# Patient Record
Sex: Male | Born: 1935 | Race: White | Hispanic: No | Marital: Married | Smoking: Former smoker
Health system: Southern US, Community
[De-identification: ages and names within clinical notes are randomized; demographics above are authoritative.]

## PROBLEM LIST (undated history)

## (undated) DIAGNOSIS — I2510 Atherosclerotic heart disease of native coronary artery without angina pectoris: Secondary | ICD-10-CM

## (undated) DIAGNOSIS — I480 Paroxysmal atrial fibrillation: Secondary | ICD-10-CM

## (undated) DIAGNOSIS — Z8669 Personal history of other diseases of the nervous system and sense organs: Secondary | ICD-10-CM

## (undated) DIAGNOSIS — Z955 Presence of coronary angioplasty implant and graft: Secondary | ICD-10-CM

## (undated) DIAGNOSIS — Z9889 Other specified postprocedural states: Secondary | ICD-10-CM

## (undated) DIAGNOSIS — N138 Other obstructive and reflux uropathy: Secondary | ICD-10-CM

## (undated) DIAGNOSIS — I10 Essential (primary) hypertension: Secondary | ICD-10-CM

## (undated) DIAGNOSIS — Z859 Personal history of malignant neoplasm, unspecified: Secondary | ICD-10-CM

## (undated) DIAGNOSIS — R001 Bradycardia, unspecified: Secondary | ICD-10-CM

## (undated) DIAGNOSIS — Z973 Presence of spectacles and contact lenses: Secondary | ICD-10-CM

## (undated) DIAGNOSIS — Z974 Presence of external hearing-aid: Secondary | ICD-10-CM

## (undated) DIAGNOSIS — M1990 Unspecified osteoarthritis, unspecified site: Secondary | ICD-10-CM

## (undated) DIAGNOSIS — N401 Enlarged prostate with lower urinary tract symptoms: Secondary | ICD-10-CM

## (undated) HISTORY — PX: CARDIOVASCULAR STRESS TEST: SHX262

## (undated) HISTORY — PX: KNEE ARTHROSCOPY: SHX127

## (undated) HISTORY — DX: Essential (primary) hypertension: I10

## (undated) HISTORY — PX: EYE SURGERY: SHX253

## (undated) HISTORY — PX: TRANSTHORACIC ECHOCARDIOGRAM: SHX275

## (undated) HISTORY — PX: HAND SURGERY: SHX662

## (undated) HISTORY — PX: TONSILLECTOMY: SUR1361

---

## 2000-06-29 ENCOUNTER — Ambulatory Visit (HOSPITAL_COMMUNITY): Admission: RE | Admit: 2000-06-29 | Discharge: 2000-06-29 | Payer: Self-pay | Admitting: Orthopedic Surgery

## 2003-04-09 ENCOUNTER — Ambulatory Visit (HOSPITAL_COMMUNITY): Admission: RE | Admit: 2003-04-09 | Discharge: 2003-04-09 | Payer: Self-pay | Admitting: Gastroenterology

## 2003-05-16 ENCOUNTER — Ambulatory Visit (HOSPITAL_COMMUNITY): Admission: RE | Admit: 2003-05-16 | Discharge: 2003-05-16 | Payer: Self-pay | Admitting: Orthopedic Surgery

## 2005-01-12 ENCOUNTER — Ambulatory Visit (HOSPITAL_BASED_OUTPATIENT_CLINIC_OR_DEPARTMENT_OTHER): Admission: RE | Admit: 2005-01-12 | Discharge: 2005-01-12 | Payer: Self-pay | Admitting: Orthopedic Surgery

## 2005-01-12 ENCOUNTER — Ambulatory Visit (HOSPITAL_COMMUNITY): Admission: RE | Admit: 2005-01-12 | Discharge: 2005-01-12 | Payer: Self-pay | Admitting: Orthopedic Surgery

## 2013-04-05 HISTORY — PX: CATARACT EXTRACTION W/ INTRAOCULAR LENS  IMPLANT, BILATERAL: SHX1307

## 2013-09-27 ENCOUNTER — Ambulatory Visit (INDEPENDENT_AMBULATORY_CARE_PROVIDER_SITE_OTHER): Payer: Medicare Other | Admitting: Podiatry

## 2013-09-27 ENCOUNTER — Encounter: Payer: Self-pay | Admitting: Podiatry

## 2013-09-27 ENCOUNTER — Ambulatory Visit (INDEPENDENT_AMBULATORY_CARE_PROVIDER_SITE_OTHER): Payer: Medicare Other

## 2013-09-27 VITALS — BP 127/78 | HR 60 | Resp 16 | Ht 73.0 in | Wt 175.0 lb

## 2013-09-27 DIAGNOSIS — M779 Enthesopathy, unspecified: Secondary | ICD-10-CM

## 2013-09-27 DIAGNOSIS — M216X9 Other acquired deformities of unspecified foot: Secondary | ICD-10-CM

## 2013-09-27 DIAGNOSIS — M2040 Other hammer toe(s) (acquired), unspecified foot: Secondary | ICD-10-CM

## 2013-09-27 NOTE — Progress Notes (Signed)
Subjective:     Patient ID: Stuart Flores, male   DOB: [birth year], 80 y.o.   MRN: [id number]  Foot Pain   patient presents stating that when he is hiking and going down heel is getting a lot of pain in the forefeet of both feet stating he try to change boot and it did not seem to solve his problem   Review of Systems  All other systems reviewed and are negative.      Objective:   Physical Exam  Nursing note and vitals reviewed. Cardiovascular: Intact distal pulses.   Musculoskeletal: Normal range of motion.  Neurological: He is alert.  Skin: Skin is dry.   neurovascular status intact with mild equinus condition noted and mild diminishment of muscle strength. Patient has high arch foot type with periodic lesions which occur underneath the lesser metatarsal and diminished fat pad with digital deformities noted of the lesser digits. 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 Reappoint when orthotics are ready

## 2013-09-27 NOTE — Progress Notes (Signed)
   Subjective:    Patient ID: Stuart Flores, male    DOB: 23-May-1933, 80 y.o.   MRN: [id number]  HPI Comments: "I don't typically have pain with my feet, but I have lately"  Patient c/o plantar forefoot and toes bilateral, left over right for several months. He went to [place] and doing hiking and noticed pain with the decline of hills. There are some callused areas. He tried thicker socks and that helped some.  Foot Pain      Review of Systems  All other systems reviewed and are negative.      Objective:   Physical Exam        Assessment & Plan:

## 2013-12-28 ENCOUNTER — Ambulatory Visit (INDEPENDENT_AMBULATORY_CARE_PROVIDER_SITE_OTHER): Payer: Medicare Other | Admitting: Podiatry

## 2013-12-28 DIAGNOSIS — M779 Enthesopathy, unspecified: Secondary | ICD-10-CM

## 2013-12-28 NOTE — Progress Notes (Signed)
Pt is here to [facility] 

## 2013-12-28 NOTE — Patient Instructions (Signed)

## 2015-03-03 DIAGNOSIS — I2089 Other forms of angina pectoris: Secondary | ICD-10-CM

## 2015-03-03 DIAGNOSIS — I208 Other forms of angina pectoris: Secondary | ICD-10-CM

## 2015-03-03 DIAGNOSIS — E785 Hyperlipidemia, unspecified: Secondary | ICD-10-CM | POA: Insufficient documentation

## 2015-03-03 NOTE — H&P (Signed)
OFFICE VISIT NOTES COPIED TO EPIC FOR DOCUMENTATION  Stuart Flores March 21, 2015 10:49 AM Location: [facility] Patient #: [id number] DOB: September 25, 1933 Married / Language: [name] / Race: White Male   History of Present Illness Stuart Pert MD; 03-21-2015 11:22 AM) Patient words: NP EVAL for CP; EKG was done at PCP this morning.  The patient is a 81 year old male who presents with chest pain. Chest pain started 2 weeks ago. Described as pressure in the middle of the chest without radiation. Present with exertion and is easily relieved with rest. No rest pain. He uses a stationary bicycle at least 3-4 days a week exercises for about 35 minutes during that episode, and has felt well. The symptoms are new, clearly exertional and relieved with rest.  He had called from [place] while visiting and his PCP, due to his symptoms of chest pain, he was prescribed metoprolol and also sublingual nitroglycerin. He is tolerating the medications well, has not used any sublingual nitroglycerin. He has stopped exercises for the past 2 weeks due to exertional chest discomfort. He was also found to have new EKG abnormality this morning with T wave inversion in the inferior and lateral leads that was not noted in this study. Due to his classic presentation he was referred to me an urgent basis for evaluation. No rest pain, no dyspnea, no PND or orthopnea. Denies any dizziness or syncope. No palpitations. He has h/o hyperlipidemia. No history of TIA or claudication. There is no history of prior hypertension or diabetes mellitus. He does not smoke cigarettes or use tobacco products.   Problem List/Past Medical (Stuart Flores; 21-Mar-2015 11:03 AM) HLD (hyperlipidemia) (E78.5) Benign prostatic hypertrophy (N40.0)  Allergies (Stuart Flores; 21-Mar-2015 10:55 AM) No Known Drug AllergiesDec 16, 2016  Family History (Stuart Flores; 03/21/15 10:56 AM) Mother Deceased. at age 79  from an MI; (was her 1st one) Father Deceased. at age 49 from Pancreatic Cancer; No known Heart conditions Sister 1 Younger  Social History (Stuart Flores; March 21, 2015 10:57 AM) Current tobacco use Former smoker. Quit at age 72 Alcohol Use Occasional alcohol use. Marital status Married. Number of Children 4. Living Situation Lives with spouse.  Past Surgical History (Stuart Flores; 21-Mar-2015 10:57 AM) Arthroscopic Knee Surgery - [id number]  Medication History (Stuart Flores; 03/21/2015 11:01 AM) Myrbetriq (  Tablet ER 24HR, 1 Oral daily) Active. Metoprolol Succinate ER (  Tablet ER 24HR, 1 Oral daily) Active. Nitroglycerin (0.4MG  Tab Sublingual, 1 Sublingual every 5 minutes as needed for chest pain.) Active. Fish Oil (  Capsule, 1 Oral daily) Active. Multiple Vitamin (1 (one) Oral daily) Active. Glucosamine 1500 Complex (1 Oral daily) Active. Probiotic Daily (1 Oral daily) Active. Aspirin EC (  Tablet DR, 1 Oral daily) Active. Medications Reconciled  Diagnostic Studies History Stuart Flores, AGNP-C; 2015/03/21 11:10 AM) Colonoscopy09/2016 Normal. Treadmill stress [phone] Normal. Labwork 08/28/2014: Creatinine 0.9, potassium 5.2, CMP normal, CBC normal, total cholesterol 206, triglycerides 88, HDL 39, LDL 149, TSH 1.71, PSA negative    Review of Systems Stuart Pert MD; Mar 21, 2015 11:25 AM) General Present- Feeling well. Not Present- Fatigue, Fever and Night Sweats. Skin Not Present- Itching and Rash. HEENT Not Present- Headache. Respiratory Not Present- Difficulty Breathing. Cardiovascular Present- Leg Cramps. Not Present- Claudications, Fainting, Orthopnea and Swelling of Extremities. Gastrointestinal Not Present- Abdominal Pain, Constipation, Diarrhea, Nausea and Vomiting. Musculoskeletal Not Present- Joint Swelling. Neurological Not Present- Headaches. Hematology Not Present- Blood Clots, Easy Bruising and Nose  Bleed.  Vitals (Stuart Flores; 03/21/2015 11:06 AM) 21-Mar-2015 10:50 AM  Weight: 175.25 lb Height: 73in Body Surface Area: 2.03 m Body Mass Index: 23.12 kg/m  Pulse: 53 (Regular)  P.OX: 96% (Room air) BP: 90/62 (Sitting, Left Arm, Standard)       Physical Exam Stuart Flores(Jagadeesh R. Carden Teel MD; 03/03/2015 11:25 AM) General Mental Status-Alert. General Appearance-Cooperative, Appears younger than stated age, Not in acute distress. Orientation-Oriented X3. Build & Nutrition-Well built.  Head and Neck Thyroid Gland Characteristics - no palpable nodules, no palpable enlargement.  Chest and Lung Exam Chest and lung exam reveals -on auscultation, normal breath sounds, no adventitious sounds and normal vocal resonance. Palpation Tender - No chest wall tenderness.  Cardiovascular Cardiovascular examination reveals -normal heart sounds, regular rate and rhythm with no murmurs, carotid auscultation reveals no bruits and abdominal aorta auscultation reveals no bruits. Inspection Jugular vein - Right - No Distention.  Abdomen Palpation/Percussion Palpation and Percussion of the abdomen reveal - Non Tender and No hepatosplenomegaly. Auscultation Auscultation of the abdomen reveals - Bowel sounds normal.  Peripheral Vascular Lower Extremity Inspection - Left - No Pigmentation, No Varicose veins. Right - No Pigmentation, No Varicose veins. Bilateral - Loss of hair. Palpation - Edema - Bilateral - No edema. Femoral pulse - Bilateral - Normal. Popliteal pulse - Bilateral - Normal. Dorsalis pedis pulse - Bilateral - Absent. Posterior tibial pulse - Bilateral - Absent. Carotid arteries - Left-No Carotid bruit. Carotid arteries - Right-No Carotid bruit. Abdomen-No prominent abdominal aortic pulsation, No epigastric bruit.  Neurologic Motor-Grossly intact without any focal deficits.  Musculoskeletal Global Assessment Left Lower Extremity - normal range of motion  without pain. Right Lower Extremity - normal range of motion without pain.    Assessment & Plan Stuart Flores(Jagadeesh R. Fox Salminen MD; 03/03/2015 9:12 PM) Angina pectoris, crescendo (I20.0) Impression: EKG 03/03/2015: Marked sinus bradycardia at rate of 43 bpm with first-degree AV block, left atrial abnormality, nonspecific ST depression in the inferior leads and lateral leads with T-wave inversion, cannot exclude ischemia. Normal QT interval. Compared to the EKG done on 03/12/2011, ST-T wave changes new. No change in heart rate. Dyslipidemia (high LDL; low HDL) (E78.4) Current Plans Started Atorvastatin Calcium 40MG , 1 (one) Tablet daily, #30, 03/03/2015, Ref. x2. Abnormal EKG (R94.31) Current Plans Mechanism of underlying disease process and action of medications discussed with the patient. I discussed primary/secondary prevention and also dietary counceling was done. Patient symptoms are very concerning for progressive crescendo angina pectoris, hence would recommend proceeding with coronary angiography. Schedule for cardiac catheterization, and possible angioplasty. We discussed regarding risks, benefits, alternatives to this including stress testing, CTA and continued medical therapy. Patient wants to proceed. Understands <1-2% risk of death, stroke, MI, urgent CABG, bleeding, infection, renal failure but not limited to these. Video recording of the procedure shown to the patient. Office visit after the tests. Unable to titrate medications due to low BP and bradycardia. This was a greater than 50 minute office visit with greater than 50% of the time spent with face-to-face encounter with patient and evaluation of complex medical issues and review of medical records.   Signed by Stuart PertJagadeesh R Matasha Smigelski, MD (03/03/2015 9:12 PM)

## 2015-03-04 ENCOUNTER — Ambulatory Visit (HOSPITAL_COMMUNITY)
Admission: RE | Admit: 2015-03-04 | Discharge: 2015-03-05 | Disposition: A | Discharge status: Home / Self Care | Payer: Medicare Other | Source: Ambulatory Visit | Attending: Cardiology | Admitting: Cardiology

## 2015-03-04 ENCOUNTER — Encounter (HOSPITAL_COMMUNITY): Payer: Self-pay | Admitting: General Practice

## 2015-03-04 ENCOUNTER — Encounter (HOSPITAL_COMMUNITY)
Admission: RE | Disposition: A | Discharge status: Home / Self Care | Payer: Medicare Other | Source: Ambulatory Visit | Attending: Cardiology

## 2015-03-04 DIAGNOSIS — Z7982 Long term (current) use of aspirin: Secondary | ICD-10-CM | POA: Diagnosis not present

## 2015-03-04 DIAGNOSIS — N400 Enlarged prostate without lower urinary tract symptoms: Secondary | ICD-10-CM | POA: Insufficient documentation

## 2015-03-04 DIAGNOSIS — E785 Hyperlipidemia, unspecified: Secondary | ICD-10-CM | POA: Diagnosis not present

## 2015-03-04 DIAGNOSIS — I25110 Atherosclerotic heart disease of native coronary artery with unstable angina pectoris: Secondary | ICD-10-CM | POA: Insufficient documentation

## 2015-03-04 DIAGNOSIS — Z9861 Coronary angioplasty status: Secondary | ICD-10-CM

## 2015-03-04 DIAGNOSIS — Z8249 Family history of ischemic heart disease and other diseases of the circulatory system: Secondary | ICD-10-CM | POA: Insufficient documentation

## 2015-03-04 DIAGNOSIS — I208 Other forms of angina pectoris: Secondary | ICD-10-CM

## 2015-03-04 DIAGNOSIS — I2089 Other forms of angina pectoris: Secondary | ICD-10-CM

## 2015-03-04 DIAGNOSIS — I25119 Atherosclerotic heart disease of native coronary artery with unspecified angina pectoris: Secondary | ICD-10-CM | POA: Diagnosis present

## 2015-03-04 DIAGNOSIS — Z87891 Personal history of nicotine dependence: Secondary | ICD-10-CM | POA: Diagnosis not present

## 2015-03-04 DIAGNOSIS — Z955 Presence of coronary angioplasty implant and graft: Secondary | ICD-10-CM

## 2015-03-04 HISTORY — DX: Presence of coronary angioplasty implant and graft: Z95.5

## 2015-03-04 HISTORY — DX: Atherosclerotic heart disease of native coronary artery without angina pectoris: I25.10

## 2015-03-04 HISTORY — PX: CARDIAC CATHETERIZATION: SHX172

## 2015-03-04 LAB — BASIC METABOLIC PANEL
Anion gap: 5 (ref 5–15)
BUN: 23 mg/dL — AB (ref 6–20)
CHLORIDE: 107 mmol/L (ref 101–111)
CO2: 29 mmol/L (ref 22–32)
CREATININE: 1.19 mg/dL (ref 0.61–1.24)
Calcium: 9 mg/dL (ref 8.9–10.3)
GFR calc Af Amer: >60 mL/min (ref >60)
GFR calc non Af Amer: 56 mL/min — ABNORMAL LOW (ref >60)
GLUCOSE: 90 mg/dL (ref 65–99)
POTASSIUM: 4.5 mmol/L (ref 3.5–5.1)
SODIUM: 141 mmol/L (ref 135–145)

## 2015-03-04 LAB — CBC
HEMATOCRIT: 38.2 % — AB (ref 39.0–52.0)
HEMOGLOBIN: 12.5 g/dL — AB (ref 13.0–17.0)
MCH: 30.1 pg (ref 26.0–34.0)
MCHC: 32.7 g/dL (ref 30.0–36.0)
MCV: 92 fL (ref 78.0–100.0)
Platelets: 227 10*3/uL (ref 150–400)
RBC: 4.15 MIL/uL — AB (ref 4.22–5.81)
RDW: 14.7 % (ref 11.5–15.5)
WBC: 6.7 10*3/uL (ref 4.0–10.5)

## 2015-03-04 LAB — POCT ACTIVATED CLOTTING TIME
Activated Clotting Time: 214 seconds
Activated Clotting Time: 552 seconds

## 2015-03-04 LAB — PROTIME-INR
INR: 1.03 (ref 0.00–1.49)
PROTHROMBIN TIME: 13.7 s (ref 11.6–15.2)

## 2015-03-04 SURGERY — LEFT HEART CATH AND CORONARY ANGIOGRAPHY

## 2015-03-04 MED ORDER — SODIUM CHLORIDE 0.9 % IJ SOLN: 3.0000 mL | INTRAMUSCULAR | Status: DC | PRN

## 2015-03-04 MED ORDER — SODIUM CHLORIDE 0.9 % IV SOLN: 250.0000 mL | INTRAVENOUS | Status: DC | PRN

## 2015-03-04 MED ORDER — HYDROMORPHONE HCL 1 MG/ML IJ SOLN
INTRAMUSCULAR | Status: AC
  Filled 2015-03-04: qty 1

## 2015-03-04 MED ORDER — SODIUM CHLORIDE 0.9 % IJ SOLN
3.0000 mL | Freq: Two times a day (BID) | INTRAMUSCULAR | Status: DC
  Administered 2015-03-04: 17:00:00 3 mL via INTRAVENOUS

## 2015-03-04 MED ORDER — HEPARIN SODIUM (PORCINE) 1000 UNIT/ML IJ SOLN
INTRAMUSCULAR | Status: AC
  Filled 2015-03-04: qty 1

## 2015-03-04 MED ORDER — ACETAMINOPHEN 325 MG PO TABS: 650.0000 mg | ORAL_TABLET | ORAL | Status: DC | PRN

## 2015-03-04 MED ORDER — LIDOCAINE HCL (PF) 1 % IJ SOLN
INTRAMUSCULAR | Status: AC
Start: 2015-03-04 — End: 2015-03-04
  Filled 2015-03-04: qty 30

## 2015-03-04 MED ORDER — HEPARIN (PORCINE) IN NACL 2-0.9 UNIT/ML-% IJ SOLN
INTRAMUSCULAR | Status: AC
  Filled 2015-03-04: qty 1000

## 2015-03-04 MED ORDER — SODIUM CHLORIDE 0.9 % WEIGHT BASED INFUSION: 1.0000 mL/kg/h | INTRAVENOUS | Status: DC

## 2015-03-04 MED ORDER — OMEGA-3-ACID ETHYL ESTERS 1 G PO CAPS
1.0000 g | ORAL_CAPSULE | Freq: Two times a day (BID) | ORAL | Status: DC
  Administered 2015-03-04 – 2015-03-05 (×2): 1 g via ORAL
  Filled 2015-03-04 (×2): qty 1

## 2015-03-04 MED ORDER — NITROGLYCERIN 1 MG/10 ML FOR IR/CATH LAB
INTRA_ARTERIAL | Status: DC | PRN
  Administered 2015-03-04: 150 ug via INTRACORONARY
  Administered 2015-03-04: 200 ug via INTRACORONARY

## 2015-03-04 MED ORDER — VERAPAMIL HCL 2.5 MG/ML IV SOLN
INTRA_ARTERIAL | Status: DC | PRN
  Administered 2015-03-04: 5 mL via INTRA_ARTERIAL

## 2015-03-04 MED ORDER — MIDAZOLAM HCL 2 MG/2ML IJ SOLN
INTRAMUSCULAR | Status: AC
  Filled 2015-03-04: qty 2

## 2015-03-04 MED ORDER — MIDAZOLAM HCL 2 MG/2ML IJ SOLN
INTRAMUSCULAR | Status: DC | PRN
  Administered 2015-03-04: 2 mg via INTRAVENOUS

## 2015-03-04 MED ORDER — SODIUM CHLORIDE 0.9 % WEIGHT BASED INFUSION
3.0000 mL/kg/h | INTRAVENOUS | Status: DC
  Administered 2015-03-04: 3 mL/kg/h via INTRAVENOUS

## 2015-03-04 MED ORDER — ASPIRIN EC 81 MG PO TBEC
81.0000 mg | DELAYED_RELEASE_TABLET | Freq: Every day | ORAL | Status: DC
  Administered 2015-03-05: 10:00:00 81 mg via ORAL
  Filled 2015-03-04: qty 1

## 2015-03-04 MED ORDER — MIRABEGRON ER 50 MG PO TB24
50.0000 mg | ORAL_TABLET | Freq: Every day | ORAL | Status: DC
  Administered 2015-03-04: 17:00:00 50 mg via ORAL
  Filled 2015-03-04 (×2): qty 1

## 2015-03-04 MED ORDER — SODIUM CHLORIDE 0.9 % WEIGHT BASED INFUSION: 3.0000 mL/kg/h | INTRAVENOUS | Status: AC

## 2015-03-04 MED ORDER — BIVALIRUDIN BOLUS VIA INFUSION - CUPID
INTRAVENOUS | Status: DC | PRN
  Administered 2015-03-04: 58.875 mg via INTRAVENOUS

## 2015-03-04 MED ORDER — TICAGRELOR 90 MG PO TABS
ORAL_TABLET | ORAL | Status: AC
Start: 2015-03-04 — End: 2015-03-04
  Filled 2015-03-04: qty 1

## 2015-03-04 MED ORDER — TICAGRELOR 90 MG PO TABS
ORAL_TABLET | ORAL | Status: DC | PRN
  Administered 2015-03-04: 180 mg via ORAL

## 2015-03-04 MED ORDER — BIVALIRUDIN 250 MG IV SOLR
INTRAVENOUS | Status: AC
  Filled 2015-03-04: qty 250

## 2015-03-04 MED ORDER — ATORVASTATIN CALCIUM 40 MG PO TABS: 40.0000 mg | ORAL_TABLET | Freq: Every day | ORAL | Status: DC

## 2015-03-04 MED ORDER — TICAGRELOR 90 MG PO TABS
ORAL_TABLET | ORAL | Status: AC
  Filled 2015-03-04: qty 1

## 2015-03-04 MED ORDER — SODIUM CHLORIDE 0.9 % IV SOLN
250.0000 mg | INTRAVENOUS | Status: DC | PRN
  Administered 2015-03-04: 1.75 mg/kg/h via INTRAVENOUS

## 2015-03-04 MED ORDER — IOHEXOL 350 MG/ML SOLN
INTRAVENOUS | Status: DC | PRN
  Administered 2015-03-04: 190 mL via INTRAVENOUS

## 2015-03-04 MED ORDER — ONDANSETRON HCL 4 MG/2ML IJ SOLN: 4.0000 mg | Freq: Four times a day (QID) | INTRAMUSCULAR | Status: DC | PRN

## 2015-03-04 MED ORDER — NITROGLYCERIN 1 MG/10 ML FOR IR/CATH LAB
INTRA_ARTERIAL | Status: AC
  Filled 2015-03-04: qty 10

## 2015-03-04 MED ORDER — ADULT MULTIVITAMIN W/MINERALS CH
1.0000 | ORAL_TABLET | Freq: Every day | ORAL | Status: DC
Start: 2015-03-05 — End: 2015-03-05
  Administered 2015-03-05: 1 via ORAL
  Filled 2015-03-04: qty 1

## 2015-03-04 MED ORDER — TICAGRELOR 90 MG PO TABS
90.0000 mg | ORAL_TABLET | Freq: Two times a day (BID) | ORAL | Status: DC
  Administered 2015-03-04 – 2015-03-05 (×2): 90 mg via ORAL
  Filled 2015-03-04: qty 1

## 2015-03-04 MED ORDER — VERAPAMIL HCL 2.5 MG/ML IV SOLN
INTRAVENOUS | Status: AC
  Filled 2015-03-04: qty 2

## 2015-03-04 MED ORDER — ANGIOPLASTY BOOK
Freq: Once | Status: AC
  Administered 2015-03-04
  Filled 2015-03-04: qty 1

## 2015-03-04 MED ORDER — SODIUM CHLORIDE 0.9 % IJ SOLN: 3.0000 mL | Freq: Two times a day (BID) | INTRAMUSCULAR | Status: DC

## 2015-03-04 MED ORDER — ASPIRIN 81 MG PO CHEW: 81.0000 mg | CHEWABLE_TABLET | ORAL | Status: DC

## 2015-03-04 MED ORDER — HYDROMORPHONE HCL 1 MG/ML IJ SOLN
INTRAMUSCULAR | Status: DC | PRN
  Administered 2015-03-04: 0.5 mg via INTRAVENOUS

## 2015-03-04 SURGICAL SUPPLY — 17 items
BALLN ANGIOSCULPT RX 2.5X10 (BALLOONS) ×4
BALLN ~~LOC~~ TREK RX 3.75X20 (BALLOONS) ×2 IMPLANT
BALLOON ANGIOSCULPT RX 2.5X10 (BALLOONS) IMPLANT
CATH INFINITI JR4 5F (CATHETERS) ×2 IMPLANT
CATH OPTITORQUE TIG 4.0 5F (CATHETERS) ×4 IMPLANT
CATH VISTA GUIDE 6FR AL1 (CATHETERS) ×2 IMPLANT
DEVICE RAD COMP TR BAND LRG (VASCULAR PRODUCTS) ×4 IMPLANT
GLIDESHEATH SLEND A-KIT 6F 20G (SHEATH) ×4 IMPLANT
KIT ENCORE 26 ADVANTAGE (KITS) ×2 IMPLANT
KIT HEART LEFT (KITS) ×4 IMPLANT
PACK CARDIAC CATHETERIZATION (CUSTOM PROCEDURE TRAY) ×4 IMPLANT
STENT RESOLUTE INTEG 2.75X22 (Permanent Stent) ×2 IMPLANT
STENT RESOLUTE INTEG 4.0X30 (Permanent Stent) ×2 IMPLANT
TRANSDUCER W/STOPCOCK (MISCELLANEOUS) ×4 IMPLANT
TUBING CIL FLEX 10 FLL-RA (TUBING) ×4 IMPLANT
WIRE COUGAR XT STRL 190CM (WIRE) ×2 IMPLANT
WIRE SAFE-T 1.5MM-J .035X260CM (WIRE) ×4 IMPLANT

## 2015-03-04 NOTE — Progress Notes (Signed)
TR BAND REMOVAL  LOCATION:    right radial  DEFLATED PER PROTOCOL:    Yes.    TIME BAND OFF / DRESSING APPLIED:    1515   SITE UPON ARRIVAL:    Level 0  SITE AFTER BAND REMOVAL:    Level 0  CIRCULATION SENSATION AND MOVEMENT:    Within Normal Limits   Yes.    COMMENTS:   Tolerated procedure well 

## 2015-03-04 NOTE — Interval H&P Note (Signed)
History and Physical Interval Note:  03/04/2015 10:36 AM  Stuart Flores  has presented today for surgery, with the diagnosis of cp  The various methods of treatment have been discussed with the patient and family. After consideration of risks, benefits and other options for treatment, the patient has consented to  Procedure(s): Left Heart Cath and Coronary Angiography (N/A) and possible PCI  as a surgical intervention .  The patient's history has been reviewed, patient examined, no change in status, stable for surgery.  I have reviewed the patient's chart and labs.  Questions were answered to the patient's satisfaction.    Ischemic Symptoms? CCS III (Marked limitation of ordinary activity) Anti-ischemic Medical Therapy? Minimal Therapy (1 class of medications) Non-invasive Test Results? No non-invasive testing performed Prior CABG? No Previous CABG   Patient Information:   1-2V CAD, no prox LAD  A (7)  Indication: 20; Score: 7   Patient Information:   1-2V-CAD with DS 50-60% With No FFR, No IVUS  I (3)  Indication: 21; Score: 3   Patient Information:   1-2V-CAD with DS 50-60% With FFR  A (7)  Indication: 22; Score: 7   Patient Information:   1-2V-CAD with DS 50-60% With FFR>0.8, IVUS not significant  I (2)  Indication: 23; Score: 2   Patient Information:   3V-CAD without LMCA With Abnormal LV systolic function  A (9)  Indication: 48; Score: 9   Patient Information:   LMCA-CAD  A (9)  Indication: 49; Score: 9   Patient Information:   2V-CAD with prox LAD PCI  A (7)  Indication: 62; Score: 7   Patient Information:   2V-CAD with prox LAD CABG  A (8)  Indication: 62; Score: 8   Patient Information:   3V-CAD without LMCA With Low CAD burden(i.e., 3 focal stenoses, low SYNTAX score) PCI  A (7)  Indication: 63; Score: 7   Patient Information:   3V-CAD without LMCA With Low CAD burden(i.e., 3 focal stenoses, low SYNTAX  score) CABG  A (9)  Indication: 63; Score: 9   Patient Information:   3V-CAD without LMCA E06c - Intermediate-high CAD burden (i.e., multiple diffuse lesions, presence of CTO, or high SYNTAX score) PCI  U (4)  Indication: 64; Score: 4   Patient Information:   3V-CAD without LMCA E06c - Intermediate-high CAD burden (i.e., multiple diffuse lesions, presence of CTO, or high SYNTAX score) CABG  A (9)  Indication: 64; Score: 9   Patient Information:   LMCA-CAD With Isolated LMCA stenosis  PCI  U (6)  Indication: 65; Score: 6   Patient Information:   LMCA-CAD With Isolated LMCA stenosis  CABG  A (9)  Indication: 65; Score: 9   Patient Information:   LMCA-CAD Additional CAD, low CAD burden (i.e., 1- to 2-vessel additional involvement, low SYNTAX score) PCI  U (5)  Indication: 66; Score: 5   Patient Information:   LMCA-CAD Additional CAD, low CAD burden (i.e., 1- to 2-vessel additional involvement, low SYNTAX score) CABG  A (9)  Indication: 66; Score: 9   Patient Information:   LMCA-CAD Additional CAD, intermediate-high CAD burden (i.e., 3-vessel involvement, presence of CTO, or high SYNTAX score) PCI  I (3)  Indication: 67; Score: 3   Patient Information:   LMCA-CAD Additional CAD, intermediate-high CAD burden (i.e., 3-vessel involvement, presence of CTO, or high SYNTAX score) CABG  A (9)  Indication: 67; Score: 9  Stuart Flores

## 2015-03-05 ENCOUNTER — Encounter (HOSPITAL_COMMUNITY): Payer: Self-pay | Admitting: Cardiology

## 2015-03-05 DIAGNOSIS — N400 Enlarged prostate without lower urinary tract symptoms: Secondary | ICD-10-CM | POA: Diagnosis not present

## 2015-03-05 DIAGNOSIS — Z7982 Long term (current) use of aspirin: Secondary | ICD-10-CM | POA: Diagnosis not present

## 2015-03-05 DIAGNOSIS — E785 Hyperlipidemia, unspecified: Secondary | ICD-10-CM | POA: Diagnosis not present

## 2015-03-05 DIAGNOSIS — I25110 Atherosclerotic heart disease of native coronary artery with unstable angina pectoris: Secondary | ICD-10-CM | POA: Diagnosis not present

## 2015-03-05 LAB — BASIC METABOLIC PANEL
Anion gap: 5 (ref 5–15)
BUN: 17 mg/dL (ref 6–20)
CALCIUM: 8.8 mg/dL — AB (ref 8.9–10.3)
CHLORIDE: 108 mmol/L (ref 101–111)
CO2: 28 mmol/L (ref 22–32)
CREATININE: 0.98 mg/dL (ref 0.61–1.24)
GFR calc non Af Amer: >60 mL/min (ref >60)
Glucose, Bld: 93 mg/dL (ref 65–99)
Potassium: 4.2 mmol/L (ref 3.5–5.1)
SODIUM: 141 mmol/L (ref 135–145)

## 2015-03-05 LAB — CBC
HCT: 35.1 % — ABNORMAL LOW (ref 39.0–52.0)
Hemoglobin: 11.7 g/dL — ABNORMAL LOW (ref 13.0–17.0)
MCH: 30.5 pg (ref 26.0–34.0)
MCHC: 33.3 g/dL (ref 30.0–36.0)
MCV: 91.4 fL (ref 78.0–100.0)
PLATELETS: 206 10*3/uL (ref 150–400)
RBC: 3.84 MIL/uL — AB (ref 4.22–5.81)
RDW: 14.9 % (ref 11.5–15.5)
WBC: 8.8 10*3/uL (ref 4.0–10.5)

## 2015-03-05 MED ORDER — TICAGRELOR 90 MG PO TABS: 90.0000 mg | ORAL_TABLET | Freq: Two times a day (BID) | ORAL | Status: DC

## 2015-03-05 MED FILL — Heparin Sodium (Porcine) Inj 1000 Unit/ML: INTRAMUSCULAR | Qty: 10 | Status: AC

## 2015-03-05 NOTE — Progress Notes (Signed)
CARDIAC REHAB PHASE I   PRE:  Rate/Rhythm: 57 SB  BP:  Sitting: 116/70        SaO2: 97 RA  MODE:  Ambulation: 1000 ft   POST:  Rate/Rhythm: 84 SR  BP:  Sitting: 132/77         SaO2: 99 RA  Pt ambulated 1000 ft on RA, independent, steady gait, tolerated well.  Pt denies cp, dizziness, DOE, declined rest stop. Completed PCI/stent education.  Reviewed risk factors, anti-platelet therapy, stent card, activity restrictions, ntg, exercise, heart healthy diet, and phase 2 cardiac rehab. Pt verbalized understanding, receptive to education. Pt agrees to phase 2 cardiac rehab referral[facility] Pt to recliner after walk, call bell within reach. Pt awaiting discharge.  [phone]  Joylene GrapesMonge, Dauna Ziska C, RN, BSN 03/05/2015 9:25 AM

## 2015-03-05 NOTE — Discharge Summary (Signed)
Physician Discharge Summary  Patient ID: Stuart HazyKenneth W Flores MRN: [id number] DOB/AGE: [birth year] 79 y.o.  Admit date: 03/04/2015 Discharge date: 03/05/2015  Primary Discharge Diagnosis 1.  Coronary artery disease involving native vessel 2.  Status post PCI 2 dominant midcircumflex and mid LAD with implantation of the stents 3.  Hyperlipidemia. 08/28/2014: Creatinine 0.9, potassium 5.2, CMP normal, CBC normal, total cholesterol 206, triglycerides 88, HDL 39, LDL 149, TSH 1.71, PSA negative  Significant Diagnostic Studies: 03/04/2015: 1. Normal LV systolic function, 55-60%. 2. Small nondominant RCA. Large dominant circumflex coronary artery. Circumflex 99%/subtotally occluded. 3. Diffusely diseased LAD, proximal 40-50%, mid diffuse moderate disease, mid LAD with a 80% stenosis. 4. Diagonal 1 with a proximal 70% stenosis, small to moderate-sized vessel. 5. Successful PTCA and stenting of the mid circumflex coronary artery with implantation of a 4.0 x 30 mm resolute integrity DES, stenosis reduced from 99% to 0%, TIMI flow improved from 2-3 6. Successful PTCA and stenting of the mid LAD with implantation of a 2.75 x 22 mm resolute DES, 80% reduced to 0%.  Hospital Course: patient evaluated in the outpatient setting with symptoms suggestive of intermediate coronary syndrome, hence urgent ordering angiography was recommended and scheduled for angiography the following morning.  Underwent successful two-vessel PCI, following morning asymptomatic with walking with cardiac rehabilitation, felt stable for discharge.  Recommendations on discharge: patient will be continued on aspirin 81 mg daily indefinitely, has been started on Brilinta 90 mg by mouth twice a day, he is now willing to stay on statin, atorvastatin 40 mg daily.  He is on low dose of metoprolol due to low blood pressure and bradycardia.  No ACE inhibitor started due to low blood pressure.  Office visit as previously scheduled for  follow-up.  Discharge Exam: Blood pressure 116/70, pulse 61, temperature 97.8 F (36.6 C), temperature source Oral, resp. rate 20, height 6\' 1"  (1.854 m), weight 79.3 kg (174 lb 13.2 oz), SpO2 98 %.  General appearance: alert, cooperative, appears stated age and no distress Resp: clear to auscultation bilaterally Cardio: regular rate and rhythm, S1, S2 normal, no murmur, click, rub or gallop GI: soft, non-tender; bowel sounds normal; no masses,  no organomegaly Extremities: extremities normal, atraumatic, no cyanosis or edema Pulses: 2+ and symmetric Neurologic: Grossly normal Labs:   Lab Results  Component Value Date   WBC 8.8 03/05/2015   HGB 11.7* 03/05/2015   HCT 35.1* 03/05/2015   MCV 91.4 03/05/2015   PLT 206 03/05/2015    Recent Labs Lab 03/05/15 0450  NA 141  K 4.2  CL 108  CO2 28  BUN 17  CREATININE 0.98  CALCIUM 8.8*  GLUCOSE 93    Lipid Panel  08/28/2014: Creatinine 0.9, potassium 5.2, CMP normal, CBC normal, total cholesterol 206, triglycerides 88, HDL 39, LDL 149, TSH 1.71, PSA negative EKG: 03/05/2015: Sinus bradycardia, inferior and lateral minimally sagging ST segment depression.  Compared to 03/04/15, inferior and lateral T wave inversion is no longer present..   FOLLOW UP PLANS AND APPOINTMENTS Discharge Instructions    AMB Referral to Cardiac Rehabilitation - Phase II    Complete by:  As directed   Diagnosis:  PCI            Medication List    STOP taking these medications        Fish Oil 1000 MG Caps      TAKE these medications        aspirin 81 MG tablet  Take 81 mg by mouth  daily.     atorvastatin 40 MG tablet  Commonly known as:  LIPITOR  Take 1 tablet by mouth daily.     GLUCOSAMINE CHONDR COMPLEX PO  Take 1 tablet by mouth daily.     metoprolol succinate 25 MG 24 hr tablet  Commonly known as:  TOPROL-XL  Take 1 tablet by mouth daily. @ Lunch Time     multivitamin capsule  Take 1 capsule by mouth daily.     MYRBETRIQ 50  MG Tb24 tablet  Generic drug:  mirabegron ER  Take 50 mg by mouth daily.     nitroGLYCERIN 0.4 MG SL tablet  Commonly known as:  NITROSTAT     PROBIOTIC DAILY PO  Take 1 capsule by mouth daily.     ticagrelor 90 MG Tabs tablet  Commonly known as:  BRILINTA  Take 1 tablet (90 mg total) by mouth 2 (two) times daily.           Follow-up Information    Follow up with Yates Decamp, MD.   Specialty:  Cardiology   Why:  Keep previous appointment   Contact information:   [address] [place] [address] [phone]       Yates Decamp, MD 03/05/2015, 9:24 AM  Pager: [phone] Office: [phone] If no answer: [phone]

## 2015-05-12 ENCOUNTER — Encounter (HOSPITAL_COMMUNITY)
Admission: RE | Admit: 2015-05-12 | Discharge: 2015-05-12 | Disposition: A | Discharge status: Home / Self Care | Payer: Self-pay | Source: Ambulatory Visit | Attending: Cardiology | Admitting: Cardiology

## 2015-05-12 DIAGNOSIS — Z959 Presence of cardiac and vascular implant and graft, unspecified: Secondary | ICD-10-CM | POA: Insufficient documentation

## 2015-05-12 DIAGNOSIS — Z9861 Coronary angioplasty status: Secondary | ICD-10-CM | POA: Insufficient documentation

## 2015-05-14 ENCOUNTER — Encounter (HOSPITAL_COMMUNITY): Payer: Self-pay

## 2015-05-16 ENCOUNTER — Encounter (HOSPITAL_COMMUNITY): Payer: Self-pay

## 2015-05-19 ENCOUNTER — Encounter (HOSPITAL_COMMUNITY)
Admission: RE | Admit: 2015-05-19 | Discharge: 2015-05-19 | Disposition: A | Discharge status: Home / Self Care | Payer: Self-pay | Source: Ambulatory Visit | Attending: Cardiology | Admitting: Cardiology

## 2015-05-21 ENCOUNTER — Encounter (HOSPITAL_COMMUNITY)
Admission: RE | Admit: 2015-05-21 | Discharge: 2015-05-21 | Disposition: A | Discharge status: Home / Self Care | Payer: Self-pay | Source: Ambulatory Visit | Attending: Cardiology | Admitting: Cardiology

## 2015-05-23 ENCOUNTER — Encounter (HOSPITAL_COMMUNITY): Payer: Self-pay

## 2015-05-26 ENCOUNTER — Encounter (HOSPITAL_COMMUNITY)
Admission: RE | Admit: 2015-05-26 | Discharge: 2015-05-26 | Disposition: A | Discharge status: Home / Self Care | Payer: Self-pay | Source: Ambulatory Visit | Attending: Cardiology | Admitting: Cardiology

## 2015-05-26 NOTE — Progress Notes (Signed)
Reviewed home exercise guidelines with patient including endpoints, temperature precautions, target heart rate and rate of perceived exertion. Pt goes to fitness center 1-2 days/week, is walking, and has equipment at home which he is using  as his mode of home exercise. Pt voices understanding of instructions given. Artist Pais, MS, ACSM CCEP

## 2015-05-28 ENCOUNTER — Encounter (HOSPITAL_COMMUNITY)
Admission: RE | Admit: 2015-05-28 | Discharge: 2015-05-28 | Disposition: A | Discharge status: Home / Self Care | Payer: Self-pay | Source: Ambulatory Visit | Attending: Cardiology | Admitting: Cardiology

## 2015-05-30 ENCOUNTER — Encounter (HOSPITAL_COMMUNITY): Payer: Self-pay

## 2015-06-02 ENCOUNTER — Encounter (HOSPITAL_COMMUNITY)
Admission: RE | Admit: 2015-06-02 | Discharge: 2015-06-02 | Disposition: A | Discharge status: Home / Self Care | Payer: Self-pay | Source: Ambulatory Visit | Attending: Cardiology | Admitting: Cardiology

## 2015-06-04 ENCOUNTER — Encounter (HOSPITAL_COMMUNITY)
Admission: RE | Admit: 2015-06-04 | Discharge: 2015-06-04 | Disposition: A | Discharge status: Home / Self Care | Payer: Self-pay | Source: Ambulatory Visit | Attending: Cardiology | Admitting: Cardiology

## 2015-06-04 DIAGNOSIS — Z959 Presence of cardiac and vascular implant and graft, unspecified: Secondary | ICD-10-CM | POA: Insufficient documentation

## 2015-06-04 DIAGNOSIS — Z9861 Coronary angioplasty status: Secondary | ICD-10-CM | POA: Insufficient documentation

## 2015-06-06 ENCOUNTER — Encounter (HOSPITAL_COMMUNITY)
Admission: RE | Admit: 2015-06-06 | Discharge: 2015-06-06 | Disposition: A | Discharge status: Home / Self Care | Payer: Self-pay | Source: Ambulatory Visit | Attending: Cardiology | Admitting: Cardiology

## 2015-06-09 ENCOUNTER — Encounter (HOSPITAL_COMMUNITY)
Admission: RE | Admit: 2015-06-09 | Discharge: 2015-06-09 | Disposition: A | Discharge status: Home / Self Care | Payer: Self-pay | Source: Ambulatory Visit | Attending: Cardiology | Admitting: Cardiology

## 2015-06-11 ENCOUNTER — Encounter (HOSPITAL_COMMUNITY)
Admission: RE | Admit: 2015-06-11 | Discharge: 2015-06-11 | Disposition: A | Discharge status: Home / Self Care | Payer: Self-pay | Source: Ambulatory Visit | Attending: Cardiology | Admitting: Cardiology

## 2015-06-13 ENCOUNTER — Encounter (HOSPITAL_COMMUNITY): Payer: Self-pay

## 2015-06-16 ENCOUNTER — Encounter (HOSPITAL_COMMUNITY): Payer: Self-pay

## 2015-06-18 ENCOUNTER — Encounter (HOSPITAL_COMMUNITY)
Admission: RE | Admit: 2015-06-18 | Discharge: 2015-06-18 | Disposition: A | Discharge status: Home / Self Care | Payer: Self-pay | Source: Ambulatory Visit | Attending: Cardiology | Admitting: Cardiology

## 2015-06-20 ENCOUNTER — Encounter (HOSPITAL_COMMUNITY): Payer: Self-pay

## 2015-06-23 ENCOUNTER — Encounter (HOSPITAL_COMMUNITY)
Admission: RE | Admit: 2015-06-23 | Discharge: 2015-06-23 | Disposition: A | Discharge status: Home / Self Care | Payer: Self-pay | Source: Ambulatory Visit | Attending: Cardiology | Admitting: Cardiology

## 2015-06-25 ENCOUNTER — Encounter (HOSPITAL_COMMUNITY)
Admission: RE | Admit: 2015-06-25 | Discharge: 2015-06-25 | Disposition: A | Discharge status: Home / Self Care | Payer: Self-pay | Source: Ambulatory Visit | Attending: Cardiology | Admitting: Cardiology

## 2015-06-27 ENCOUNTER — Encounter (HOSPITAL_COMMUNITY)
Admission: RE | Admit: 2015-06-27 | Discharge: 2015-06-27 | Disposition: A | Discharge status: Home / Self Care | Payer: Self-pay | Source: Ambulatory Visit | Attending: Cardiology | Admitting: Cardiology

## 2015-06-30 ENCOUNTER — Encounter (HOSPITAL_COMMUNITY)
Admission: RE | Admit: 2015-06-30 | Discharge: 2015-06-30 | Disposition: A | Discharge status: Home / Self Care | Payer: Self-pay | Source: Ambulatory Visit | Attending: Cardiology | Admitting: Cardiology

## 2015-07-02 ENCOUNTER — Encounter (HOSPITAL_COMMUNITY): Payer: Self-pay

## 2015-07-04 ENCOUNTER — Encounter (HOSPITAL_COMMUNITY): Payer: Self-pay

## 2015-07-07 ENCOUNTER — Encounter (HOSPITAL_COMMUNITY): Payer: Self-pay

## 2015-07-09 ENCOUNTER — Encounter (HOSPITAL_COMMUNITY): Payer: Self-pay

## 2015-07-11 ENCOUNTER — Encounter (HOSPITAL_COMMUNITY): Payer: Self-pay

## 2015-07-14 ENCOUNTER — Encounter (HOSPITAL_COMMUNITY): Payer: Self-pay

## 2015-07-16 ENCOUNTER — Encounter (HOSPITAL_COMMUNITY): Payer: Self-pay

## 2015-07-18 ENCOUNTER — Encounter (HOSPITAL_COMMUNITY): Payer: Self-pay

## 2015-07-21 ENCOUNTER — Encounter (HOSPITAL_COMMUNITY): Payer: Self-pay

## 2015-07-23 ENCOUNTER — Encounter (HOSPITAL_COMMUNITY): Payer: Self-pay

## 2015-07-25 ENCOUNTER — Encounter (HOSPITAL_COMMUNITY): Payer: Self-pay

## 2015-07-28 ENCOUNTER — Encounter (HOSPITAL_COMMUNITY): Payer: Self-pay

## 2015-07-30 ENCOUNTER — Encounter (HOSPITAL_COMMUNITY): Payer: Self-pay

## 2015-08-01 ENCOUNTER — Encounter (HOSPITAL_COMMUNITY): Payer: Self-pay

## 2015-09-14 ENCOUNTER — Ambulatory Visit: Payer: Self-pay | Admitting: Orthopedic Surgery

## 2015-09-30 ENCOUNTER — Other Ambulatory Visit: Payer: Self-pay | Admitting: Orthopedic Surgery

## 2015-09-30 DIAGNOSIS — M545 Low back pain: Secondary | ICD-10-CM

## 2015-10-06 ENCOUNTER — Encounter (HOSPITAL_COMMUNITY)
Admission: RE | Admit: 2015-10-06 | Discharge: 2015-10-06 | Disposition: A | Discharge status: Home / Self Care | Payer: Medicare Other | Source: Ambulatory Visit | Attending: Orthopedic Surgery | Admitting: Orthopedic Surgery

## 2015-10-06 ENCOUNTER — Encounter (HOSPITAL_COMMUNITY): Payer: Self-pay

## 2015-10-06 DIAGNOSIS — Z01812 Encounter for preprocedural laboratory examination: Secondary | ICD-10-CM | POA: Diagnosis not present

## 2015-10-06 DIAGNOSIS — M1711 Unilateral primary osteoarthritis, right knee: Secondary | ICD-10-CM | POA: Insufficient documentation

## 2015-10-06 HISTORY — DX: Unspecified osteoarthritis, unspecified site: M19.90

## 2015-10-06 HISTORY — DX: Bradycardia, unspecified: R00.1

## 2015-10-06 LAB — CBC
HEMATOCRIT: 38.4 % — AB (ref 39.0–52.0)
HEMOGLOBIN: 12.9 g/dL — AB (ref 13.0–17.0)
MCH: 30.7 pg (ref 26.0–34.0)
MCHC: 33.6 g/dL (ref 30.0–36.0)
MCV: 91.4 fL (ref 78.0–100.0)
Platelets: 282 10*3/uL (ref 150–400)
RBC: 4.2 MIL/uL — ABNORMAL LOW (ref 4.22–5.81)
RDW: 14.8 % (ref 11.5–15.5)
WBC: 8.1 10*3/uL (ref 4.0–10.5)

## 2015-10-06 LAB — URINALYSIS, ROUTINE W REFLEX MICROSCOPIC
BILIRUBIN URINE: NEGATIVE
Glucose, UA: NEGATIVE mg/dL
HGB URINE DIPSTICK: NEGATIVE
KETONES UR: NEGATIVE mg/dL
Leukocytes, UA: NEGATIVE
NITRITE: NEGATIVE
PH: 7 (ref 5.0–8.0)
Protein, ur: NEGATIVE mg/dL
SPECIFIC GRAVITY, URINE: 1.016 (ref 1.005–1.030)

## 2015-10-06 LAB — COMPREHENSIVE METABOLIC PANEL
ALBUMIN: 4.1 g/dL (ref 3.5–5.0)
ALK PHOS: 47 U/L (ref 38–126)
ALT: 24 U/L (ref 17–63)
ANION GAP: 6 (ref 5–15)
AST: 34 U/L (ref 15–41)
BILIRUBIN TOTAL: 1.1 mg/dL (ref 0.3–1.2)
BUN: 21 mg/dL — AB (ref 6–20)
CALCIUM: 9 mg/dL (ref 8.9–10.3)
CO2: 27 mmol/L (ref 22–32)
Chloride: 104 mmol/L (ref 101–111)
Creatinine, Ser: 0.92 mg/dL (ref 0.61–1.24)
GFR calc Af Amer: >60 mL/min (ref >60)
GLUCOSE: 91 mg/dL (ref 65–99)
POTASSIUM: 4.8 mmol/L (ref 3.5–5.1)
Sodium: 137 mmol/L (ref 135–145)
TOTAL PROTEIN: 6.6 g/dL (ref 6.5–8.1)

## 2015-10-06 LAB — PROTIME-INR
INR: 1.11 (ref 0.00–1.49)
PROTHROMBIN TIME: 14.1 s (ref 11.6–15.2)

## 2015-10-06 LAB — APTT: aPTT: 32 seconds (ref 24–37)

## 2015-10-06 LAB — SURGICAL PCR SCREEN
MRSA, PCR: NEGATIVE
Staphylococcus aureus: NEGATIVE

## 2015-10-06 NOTE — Patient Instructions (Signed)
Stuart HazyKenneth W Wickizer  10/06/2015   Your procedure is scheduled on: 10-27-15   [facility]  [facility] to 3rd floor to  Short Stay Center at  0630 AM.  Call this number if you have problems the morning of surgery [phone]   Remember: ONLY 1 PERSON MAY GO WITH YOU TO SHORT STAY TO GET  READY MORNING OF YOUR SURGERY.  Do not eat food or drink liquids :After Midnight.     Take these medicines the morning of surgery with A SIP OF WATER:  Atorvastatin. Metoprolol. DO NOT TAKE ANY DIABETIC MEDICATIONS DAY OF YOUR SURGERY                               You may not have any metal on your body including hair pins and              piercings  Do not wear jewelry, make-up, lotions, powders or perfumes, deodorant             Do not wear nail polish.  Do not shave  48 hours prior to surgery.              Men may shave face and neck.   Do not bring valuables to the hospital. [facility] IS NOT             RESPONSIBLE   FOR VALUABLES.  Contacts, dentures or bridgework may not be worn into surgery.  Leave suitcase in the car. After surgery it may be brought to your room.     Patients discharged the day of surgery will not be allowed to drive home.  Name and phone number of your driver: susan, spouse [phone] cell  Special Instructions: N/A              Please read over the following fact sheets you were given: _____________________________________________________________________             [facility] - Pre[facility] Before surgery, you can play an important role.  Because skin is not sterile, your skin needs to be as free of germs as possible.  You can reduce the number of germs on your skin by washing with CHG (chlorahexidine gluconate) soap before surgery.  CHG is an antiseptic cleaner which kills germs and bonds with the skin to continue killing germs even after washing. Please DO NOT use if you have an allergy to CHG or  antibacterial soaps.  If your skin becomes reddened/irritated stop using the CHG and inform your nurse when you arrive at Short Stay. Do not shave (including legs and underarms) for at least 48 hours prior to the first CHG shower.  You may shave your face/neck. Please follow these instructions carefully:  1.  Shower with CHG Soap the night before surgery and the  morning of Surgery.  2.  If you choose to wash your hair, wash your hair first as usual with your  normal  shampoo.  3.  After you shampoo, rinse your hair and body thoroughly to remove the  shampoo.                           4.  Use CHG as you would any other liquid soap.  You can apply chg  directly  to the skin and wash                       Gently with a scrungie or clean washcloth.  5.  Apply the CHG Soap to your body ONLY FROM THE NECK DOWN.   Do not use on face/ open                           Wound or open sores. Avoid contact with eyes, ears mouth and genitals (private parts).                       Wash face,  Genitals (private parts) with your normal soap.             6.  Wash thoroughly, paying special attention to the area where your surgery  will be performed.  7.  Thoroughly rinse your body with warm water from the neck down.  8.  DO NOT shower/wash with your normal soap after using and rinsing off  the CHG Soap.                9.  Pat yourself dry with a clean towel.            10.  Wear clean pajamas.            11.  Place clean sheets on your bed the night of your first shower and do not  sleep with pets. Day of Surgery : Do not apply any lotions/deodorants the morning of surgery.  Please wear clean clothes to the hospital/surgery center.  FAILURE TO FOLLOW THESE INSTRUCTIONS MAY RESULT IN THE CANCELLATION OF YOUR SURGERY PATIENT SIGNATURE_________________________________  NURSE SIGNATURE__________________________________  ________________________________________________________________________   Adam Phenix  An incentive spirometer is a tool that can help keep your lungs clear and active. This tool measures how well you are filling your lungs with each breath. Taking long deep breaths may help reverse or decrease the chance of developing breathing (pulmonary) problems (especially infection) following:  A long period of time when you are unable to move or be active. BEFORE THE PROCEDURE   If the spirometer includes an indicator to show your best effort, your nurse or respiratory therapist will set it to a desired goal.  If possible, sit up straight or lean slightly forward. Try not to slouch.  Hold the incentive spirometer in an upright position. INSTRUCTIONS FOR USE   Sit on the edge of your bed if possible, or sit up as far as you can in bed or on a chair.  Hold the incentive spirometer in an upright position.  Breathe out normally.  Place the mouthpiece in your mouth and seal your lips tightly around it.  Breathe in slowly and as deeply as possible, raising the piston or the ball toward the top of the column.  Hold your breath for 3-5 seconds or for as long as possible. Allow the piston or ball to fall to the bottom of the column.  Remove the mouthpiece from your mouth and breathe out normally.  Rest for a few seconds and repeat Steps 1 through 7 at least 10 times every 1-2 hours when you are awake. Take your time and take a few normal breaths between deep breaths.  The spirometer may include an indicator to show your best effort. Use the indicator as a goal to work toward during each repetition.  After each set of 10 deep breaths, practice coughing to be sure your lungs are clear. If you have an incision (the cut made at the time of surgery), support your incision when coughing by placing a pillow or rolled up towels firmly against it. Once you are able to get out of bed, walk around indoors and cough well. You may stop using the incentive spirometer when instructed by  your caregiver.  RISKS AND COMPLICATIONS  Take your time so you do not get dizzy or light-headed.  If you are in pain, you may need to take or ask for pain medication before doing incentive spirometry. It is harder to take a deep breath if you are having pain. AFTER USE  Rest and breathe slowly and easily.  It can be helpful to keep track of a log of your progress. Your caregiver can provide you with a simple table to help with this. If you are using the spirometer at home, follow these instructions: [place] IF:   You are having difficultly using the spirometer.  You have trouble using the spirometer as often as instructed.  Your pain medication is not giving enough relief while using the spirometer.  You develop fever of 100.5 F (38.1 C) or higher. SEEK IMMEDIATE MEDICAL CARE IF:   You cough up bloody sputum that had not been present before.  You develop fever of 102 F (38.9 C) or greater.  You develop worsening pain at or near the incision site. MAKE SURE YOU:   Understand these instructions.  Will watch your condition.  Will get help right away if you are not doing well or get worse. Document Released: 08/02/2006 Document Revised: 06/14/2011 Document Reviewed: 10/03/2006 ExitCare Patient Information 2014 ExitCare, [place].   ________________________________________________________________________  WHAT IS A BLOOD TRANSFUSION? Blood Transfusion Information  A transfusion is the replacement of blood or some of its parts. Blood is made up of multiple cells which provide different functions.  Red blood cells carry oxygen and are used for blood loss replacement.  White blood cells fight against infection.  Platelets control bleeding.  Plasma helps clot blood.  Other blood products are available for specialized needs, such as hemophilia or other clotting disorders. BEFORE THE TRANSFUSION  Who gives blood for transfusions?   Healthy volunteers who are  fully evaluated to make sure their blood is safe. This is blood bank blood. Transfusion therapy is the safest it has ever been in the practice of medicine. Before blood is taken from a donor, a complete history is taken to make sure that person has no history of diseases nor engages in risky social behavior (examples are intravenous drug use or sexual activity with multiple partners). The donor's travel history is screened to minimize risk of transmitting infections, such as malaria. The donated blood is tested for signs of infectious diseases, such as HIV and hepatitis. The blood is then tested to be sure it is compatible with you in order to minimize the chance of a transfusion reaction. If you or a relative donates blood, this is often done in anticipation of surgery and is not appropriate for emergency situations. It takes many days to process the donated blood. RISKS AND COMPLICATIONS Although transfusion therapy is very safe and saves many lives, the main dangers of transfusion include:   Getting an infectious disease.  Developing a transfusion reaction. This is an allergic reaction to something in the blood you were given. Every precaution is taken to prevent this. The decision  to have a blood transfusion has been considered carefully by your caregiver before blood is given. Blood is not given unless the benefits outweigh the risks. AFTER THE TRANSFUSION  Right after receiving a blood transfusion, you will usually feel much better and more energetic. This is especially true if your red blood cells have gotten low (anemic). The transfusion raises the level of the red blood cells which carry oxygen, and this usually causes an energy increase.  The nurse administering the transfusion will monitor you carefully for complications. HOME CARE INSTRUCTIONS  No special instructions are needed after a transfusion. You may find your energy is better. Speak with your caregiver about any limitations on  activity for underlying diseases you may have. SEEK MEDICAL CARE IF:   Your condition is not improving after your transfusion.  You develop redness or irritation at the intravenous (IV) site. SEEK IMMEDIATE MEDICAL CARE IF:  Any of the following symptoms occur over the next 12 hours:  Shaking chills.  You have a temperature by mouth above 102 F (38.9 C), not controlled by medicine.  Chest, back, or muscle pain.  People around you feel you are not acting correctly or are confused.  Shortness of breath or difficulty breathing.  Dizziness and fainting.  You get a rash or develop hives.  You have a decrease in urine output.  Your urine turns a dark color or changes to pink, red, or brown. Any of the following symptoms occur over the next 10 days:  You have a temperature by mouth above 102 F (38.9 C), not controlled by medicine.  Shortness of breath.  Weakness after normal activity.  The white part of the eye turns yellow (jaundice).  You have a decrease in the amount of urine or are urinating less often.  Your urine turns a dark color or changes to pink, red, or brown. Document Released: 03/19/2000 Document Revised: 06/14/2011 Document Reviewed: 11/06/2007 [facility] Patient Information 2014 [place], [place].  _______________________________________________________________________

## 2015-10-06 NOTE — Pre-Procedure Instructions (Addendum)
EKG 11'16 Epic. Lov note Dr. Jacinto HalimGanji with chart 07-01-15. Pt. Sent link to [facility] education per request and joint booklet also given today.

## 2015-10-26 ENCOUNTER — Ambulatory Visit: Payer: Self-pay | Admitting: Orthopedic Surgery

## 2015-10-26 NOTE — H&P (Signed)
Stuart Flores DOB: Dec 26, 1933 Married / Language: [name] / Race: White Male Date of Admission:  10/27/2015 CC:  Right Knee Pain History of Present Illness The patient is a 82 year old male who comes in for a preoperative History and Physical. The patient is scheduled for a right total knee arthroplasty to be performed by Dr. [name]. [name], MD at [facility] on 10/27/2015. The patient is a 82 year old male who presents today for follow up of their knee. The patient is being followed for their bilateral knee pain and osteoarthritis. They are now 1 year(s) out from when symptoms began. Symptoms reported today include: pain (aching pain weightbearing. radiates down both legs.). The patient feels that they are doing poorly and report their pain level to be moderate to severe. The following medication has been used for pain control: none. The patient has not gotten any relief of their symptoms with Cortisone injections (relief did not last long). The patient indicates that they have questions or concerns today regarding pain and their progress at this point (pt. is here today to discuss sx). Note for "Follow-up Knee": pt. was seen by Dr. [name] last regarding his knees. Pt. had right knee scope 2006. left knee scope 2002 He was referred over by Dr. [name] who saw him for ongoing knee pain. The knee has been hurting him for five years or more and has been gradually getting more over the past year. There has been no specific injury or accident. He states they have just been progressive in nature. He does have pain with any standing any length of time, especially at social functions. He does a lot of bird watching and bird photography. When he is standing around for a long time, they will hurt him when he goes to move. He had one injection in the past by Dr. [name]. He also has some gel series in the past, which he states provided him no benefit. He has undergone knee scopes, which we have  documented in 2006 on the right knee and 2002 on the left knee. Unfortunately, the knees have progressively gotten worse. He states they are both equally bad. He denies any swelling, popping or catching with them. There is no buckling, but the big thing is more stiffness and pain. He does have a little bit of difficulty getting up and out of the bed and out of car, but he says he can get up and down steps still okay. [name] states he is fairly active, he enjoys traveling with his wife and he has been to multiple foreign trips abroad, and he still has a couple scheduled for this fall and next winter. AP both knees and lateral shows that he has lateral compartment arthritis, bone on bone, right worse than left knee with patellofemoral arthritis also. At this point, the most predictable means of improving pain and function is total knee arthroplasty. The procedure, risks, potential complications and rehab course are discussed in detail and the patient elects to proceed. He feels that he would like to get his knees replaced. Recommended doing the right one first since the arthritis is worse and he is more symptomatic. They have been treated conservatively in the past for the above stated problem and despite conservative measures, they continue to have progressive pain and severe functional limitations and dysfunction. They have failed non-operative management including home exercise, medications, and injections. It is felt that they would benefit from undergoing total joint replacement. Risks and benefits  of the procedure have been discussed with the patient and they elect to proceed with surgery. There are no active contraindications to surgery such as ongoing infection or rapidly progressive neurological disease.  Problem List/Past Medical Shoulder pain (M25.519)  left Arm pain (M79.603)  left Primary osteoarthritis of both knees (M17.0)  Degenerative lumbar disc (M51.36)  Acute bilateral low back  pain without sciatica (M54.5)  Coronary Artery Disease/Heart Disease  Hyperlipidemia  Glaucoma  Cataract  Allergies  No Known Drug Allergies   Family History  Cancer  father and grandmother fathers side Rheumatoid Arthritis  Mother. mother  Social History  Alcohol use  current drinker; drinks beer, wine and hard liquor; 8-14 per week Children  4 Current work status  retired Financial planner (Currently)  no Drug/Alcohol Rehab (Previously)  no Exercise  Exercises daily; does running / walking and gym / weights Illicit drug use  no Living situation  live with spouse Marital status  married Number of flights of stairs before winded  greater than 5 Pain Contract  no Tobacco / smoke exposure  no Tobacco use  Former smoker. former smoker; smoke(d) 1 pack(s) per day Post-Surgical Plans  Home Advance Directives  Living Will, Healthcare POA  Medication History Atorvastatin Calcium (  Tablet, Oral) Active. Brilinta (  Tablet, Oral) Active. Nitroglycerin (0.4MG  Tab Sublingual, Sublingual) Active. Metoprolol Succinate ER (  Tablet ER 24HR, Oral) Active. Probiotic Daily (Oral) Active. Myrbetriq (  Tablet ER 24HR, Oral) Active. Aspirin (  Tablet, 1 (one) Oral) Active. Glucosamine Complex (1 (one) Oral) Active. One-A-Day Mens (1 (one) Oral) Active.  Past Surgical History Heart Stents  Two Bilateral Knee Scopes  2002 and 2006   Review of Systems General Not Present- Chills, Fatigue, Fever, Memory Loss, Night Sweats, Weight Gain and Weight Loss. Skin Not Present- Eczema, Hives, Itching, Lesions and Rash. HEENT Not Present- Dentures, Double Vision, Headache, Hearing Loss, Tinnitus and Visual Loss. Respiratory Not Present- Allergies, Chronic Cough, Coughing up blood, Shortness of breath at rest and Shortness of breath with exertion. Cardiovascular Not Present- Chest Pain, Difficulty Breathing Lying Down, Murmur, Palpitations,  Racing/skipping heartbeats and Swelling. Gastrointestinal Not Present- Abdominal Pain, Bloody Stool, Constipation, Diarrhea, Difficulty Swallowing, Heartburn, Jaundice, Loss of appetitie, Nausea and Vomiting. Male Genitourinary Not Present- Blood in Urine, Discharge, Flank Pain, Incontinence, Painful Urination, Urgency, Urinary frequency, Urinary Retention, Urinating at Night and Weak urinary stream. Musculoskeletal Present- Joint Pain. Not Present- Back Pain, Joint Swelling, Morning Stiffness, Muscle Pain, Muscle Weakness and Spasms. Neurological Not Present- Blackout spells, Difficulty with balance, Dizziness, Paralysis, Tremor and Weakness. Psychiatric Not Present- Insomnia.  Vitals Weight: 167 lb Height: 72in Weight was reported by patient. Height was reported by patient. Body Surface Area: 1.97 m Body Mass Index: 22.65 kg/m  Pulse: 56 (Regular)  BP: 118/64 (Sitting, Right Arm, Standard)  Physical Exam  General Mental Status -Alert, cooperative and good historian. General Appearance-pleasant, Not in acute distress. Orientation-Oriented X3. Build & Nutrition-Well nourished and Well developed.  Head and Neck Head-normocephalic, atraumatic . Neck Global Assessment - supple, no bruit auscultated on the right, no bruit auscultated on the left.  Eye Vision-Wears corrective lenses. Pupil - Bilateral-Regular and Round. Motion - Bilateral-EOMI.  Chest and Lung Exam Auscultation Breath sounds - clear at anterior chest wall and clear at posterior chest wall. Adventitious sounds - No Adventitious sounds.  Cardiovascular Auscultation Rhythm - Regular rate and rhythm. Heart Sounds - S1 WNL and S2 WNL. Murmurs & Other Heart Sounds - Auscultation of the heart reveals - No Murmurs.  Abdomen Palpation/Percussion Tenderness - Abdomen is non-tender to palpation. Rigidity (guarding) - Abdomen is soft. Auscultation Auscultation of the abdomen reveals - Bowel  sounds normal.  Male Genitourinary Note: Not done, not pertinent to present illness   Musculoskeletal Note: On exam, he is in no distress. His hip show normal range of motion, no discomfort. His knee show no effusion. Right knee range of motion is about 0 to 125. The left knee about 0 to 130. He has moderate crepitus on range of motion of each knee. He has some tenderness of lateral greater than medial with no instability. There is slight valgus on the right knee. Pulse, sensation and motor intact.  RADIOGRAPHS AP both knees and lateral shows that he has lateral compartment arthritis, bone on bone, right worse than left knee with patellofemoral arthritis also.  Assessment & Plan Primary osteoarthritis of right knee (M17.11) Primary osteoarthritis of one knee, left (M17.12)  Note:Surgical Plans: Right Total Knee Replacement  Disposition: Home  PCP: Dr. Felipa Eth - 'He is advised to hold the Brilinta.Marland KitchenMarland KitchenHe is advised to continue the aspirin indefinitley and not to hold if for surgery.'  Topical TXA  Anesthesia Issues: None  [facility]ITAS STUDY PATIENT Traditional Therapy - HHPT  Signed electronically by Beckey Rutter, III PA-C

## 2015-10-26 NOTE — Anesthesia Preprocedure Evaluation (Addendum)
Anesthesia Evaluation  Patient identified by MRN, date of birth, ID band Patient awake    Reviewed: Allergy & Precautions, H&P , NPO status , Patient's Chart, lab work & pertinent test results, reviewed documented beta blocker date and time   Airway Mallampati: III  TM Distance: >3 FB Neck ROM: Full    Dental no notable dental hx. (+) Teeth Intact, Dental Advisory Given   Pulmonary neg pulmonary ROS, former smoker,    Pulmonary exam normal breath sounds clear to auscultation       Cardiovascular hypertension, On Medications and On Home Beta Blockers + CAD and + Cardiac Stents   Rhythm:Regular Rate:Normal     Neuro/Psych negative neurological ROS  negative psych ROS   GI/Hepatic negative GI ROS, Neg liver ROS,   Endo/Other  negative endocrine ROS  Renal/GU negative Renal ROS  negative genitourinary   Musculoskeletal  (+) Arthritis ,   Abdominal   Peds  Hematology negative hematology ROS (+)   Anesthesia Other Findings   Reproductive/Obstetrics negative OB ROS                            Anesthesia Physical Anesthesia Plan  ASA: III  Anesthesia Plan: MAC and Spinal   Post-op Pain Management:    Induction: Intravenous  Airway Management Planned: Simple Face Mask  Additional Equipment:   Intra-op Plan:   Post-operative Plan:   Informed Consent: I have reviewed the patients History and Physical, chart, labs and discussed the procedure including the risks, benefits and alternatives for the proposed anesthesia with the patient or authorized representative who has indicated his/her understanding and acceptance.   Dental advisory given  Plan Discussed with: CRNA  Anesthesia Plan Comments: (Pt off Brilinta 5 days.)       Anesthesia Quick Evaluation

## 2015-10-27 ENCOUNTER — Encounter (HOSPITAL_COMMUNITY): Payer: Self-pay

## 2015-10-27 ENCOUNTER — Encounter (HOSPITAL_COMMUNITY)
Admission: RE | Disposition: A | Discharge status: Home Health | Payer: Self-pay | Source: Ambulatory Visit | Attending: Orthopedic Surgery

## 2015-10-27 ENCOUNTER — Inpatient Hospital Stay (HOSPITAL_COMMUNITY): Payer: Medicare Other | Admitting: Certified Registered Nurse Anesthetist

## 2015-10-27 ENCOUNTER — Inpatient Hospital Stay (HOSPITAL_COMMUNITY)
Admission: RE | Admit: 2015-10-27 | Discharge: 2015-10-29 | DRG: 470 | Disposition: A | Discharge status: Home Health | Payer: Medicare Other | Source: Ambulatory Visit | Attending: Orthopedic Surgery | Admitting: Orthopedic Surgery

## 2015-10-27 DIAGNOSIS — M1710 Unilateral primary osteoarthritis, unspecified knee: Secondary | ICD-10-CM | POA: Diagnosis present

## 2015-10-27 DIAGNOSIS — I2510 Atherosclerotic heart disease of native coronary artery without angina pectoris: Secondary | ICD-10-CM | POA: Diagnosis present

## 2015-10-27 DIAGNOSIS — E785 Hyperlipidemia, unspecified: Secondary | ICD-10-CM | POA: Diagnosis present

## 2015-10-27 DIAGNOSIS — M1711 Unilateral primary osteoarthritis, right knee: Principal | ICD-10-CM | POA: Diagnosis present

## 2015-10-27 DIAGNOSIS — I10 Essential (primary) hypertension: Secondary | ICD-10-CM | POA: Diagnosis present

## 2015-10-27 DIAGNOSIS — Z79899 Other long term (current) drug therapy: Secondary | ICD-10-CM

## 2015-10-27 DIAGNOSIS — M179 Osteoarthritis of knee, unspecified: Secondary | ICD-10-CM | POA: Diagnosis present

## 2015-10-27 DIAGNOSIS — M25561 Pain in right knee: Secondary | ICD-10-CM | POA: Diagnosis present

## 2015-10-27 DIAGNOSIS — Z87891 Personal history of nicotine dependence: Secondary | ICD-10-CM

## 2015-10-27 HISTORY — PX: TOTAL KNEE ARTHROPLASTY: SHX125

## 2015-10-27 LAB — ABO/RH: ABO/RH(D): O POS

## 2015-10-27 LAB — TYPE AND SCREEN
ABO/RH(D): O POS
ANTIBODY SCREEN: NEGATIVE

## 2015-10-27 SURGERY — ARTHROPLASTY, KNEE, TOTAL
Anesthesia: Monitor Anesthesia Care | Site: Knee | Laterality: Right

## 2015-10-27 MED ORDER — SODIUM CHLORIDE 0.9 % IV SOLN
INTRAVENOUS | Status: DC
  Administered 2015-10-27: 100 mL/h via INTRAVENOUS
  Administered 2015-10-28: 02:00:00 via INTRAVENOUS

## 2015-10-27 MED ORDER — ONDANSETRON HCL 4 MG/2ML IJ SOLN: 4.0000 mg | Freq: Four times a day (QID) | INTRAMUSCULAR | Status: DC | PRN

## 2015-10-27 MED ORDER — MENTHOL 3 MG MT LOZG
1.0000 | LOZENGE | OROMUCOSAL | Status: DC | PRN
Start: 2015-10-27 — End: 2015-10-29

## 2015-10-27 MED ORDER — BUPIVACAINE HCL 0.25 % IJ SOLN
INTRAMUSCULAR | Status: DC | PRN
  Administered 2015-10-27: 20 mL

## 2015-10-27 MED ORDER — DOCUSATE SODIUM 100 MG PO CAPS
100.0000 mg | ORAL_CAPSULE | Freq: Two times a day (BID) | ORAL | Status: DC
  Administered 2015-10-27 – 2015-10-29 (×4): 100 mg via ORAL
  Filled 2015-10-27 (×5): qty 1

## 2015-10-27 MED ORDER — SODIUM CHLORIDE 0.9 % IJ SOLN
INTRAMUSCULAR | Status: AC
  Filled 2015-10-27: qty 50

## 2015-10-27 MED ORDER — BUPIVACAINE LIPOSOME 1.3 % IJ SUSP
INTRAMUSCULAR | Status: DC | PRN
  Administered 2015-10-27: 50 mL

## 2015-10-27 MED ORDER — ACETAMINOPHEN 10 MG/ML IV SOLN
INTRAVENOUS | Status: AC
  Filled 2015-10-27: qty 100

## 2015-10-27 MED ORDER — LACTATED RINGERS IV SOLN
INTRAVENOUS | Status: DC
  Administered 2015-10-27 (×3): via INTRAVENOUS

## 2015-10-27 MED ORDER — FENTANYL CITRATE (PF) 100 MCG/2ML IJ SOLN
INTRAMUSCULAR | Status: AC
  Filled 2015-10-27: qty 2

## 2015-10-27 MED ORDER — ACETAMINOPHEN 325 MG PO TABS: 650.0000 mg | ORAL_TABLET | Freq: Four times a day (QID) | ORAL | Status: DC | PRN

## 2015-10-27 MED ORDER — ATORVASTATIN CALCIUM 20 MG PO TABS
40.0000 mg | ORAL_TABLET | Freq: Every day | ORAL | Status: DC
  Administered 2015-10-28 – 2015-10-29 (×2): 40 mg via ORAL
  Filled 2015-10-27 (×2): qty 2

## 2015-10-27 MED ORDER — CEFAZOLIN SODIUM-DEXTROSE 2-4 GM/100ML-% IV SOLN
2.0000 g | Freq: Four times a day (QID) | INTRAVENOUS | Status: AC
  Administered 2015-10-27 (×2): 2 g via INTRAVENOUS
  Filled 2015-10-27 (×2): qty 100

## 2015-10-27 MED ORDER — BUPIVACAINE LIPOSOME 1.3 % IJ SUSP
20.0000 mL | Freq: Once | INTRAMUSCULAR | Status: DC
  Filled 2015-10-27: qty 20

## 2015-10-27 MED ORDER — HYDROMORPHONE HCL 1 MG/ML IJ SOLN: 0.2500 mg | INTRAMUSCULAR | Status: DC | PRN

## 2015-10-27 MED ORDER — PROPOFOL 10 MG/ML IV BOLUS
INTRAVENOUS | Status: DC | PRN
  Administered 2015-10-27 (×2): 10 mg via INTRAVENOUS

## 2015-10-27 MED ORDER — OXYCODONE HCL 5 MG PO TABS
5.0000 mg | ORAL_TABLET | ORAL | Status: DC | PRN
Start: 2015-10-27 — End: 2015-10-29
  Administered 2015-10-27: 10 mg via ORAL
  Administered 2015-10-27: 5 mg via ORAL
  Administered 2015-10-27 – 2015-10-29 (×10): 10 mg via ORAL
  Filled 2015-10-27: qty 2
  Filled 2015-10-27: qty 1
  Filled 2015-10-27 (×12): qty 2

## 2015-10-27 MED ORDER — ASPIRIN EC 81 MG PO TBEC
81.0000 mg | DELAYED_RELEASE_TABLET | Freq: Every day | ORAL | Status: DC
  Administered 2015-10-28 – 2015-10-29 (×2): 81 mg via ORAL
  Filled 2015-10-27 (×2): qty 1

## 2015-10-27 MED ORDER — ACETAMINOPHEN 650 MG RE SUPP: 650.0000 mg | Freq: Four times a day (QID) | RECTAL | Status: DC | PRN

## 2015-10-27 MED ORDER — MIDAZOLAM HCL 5 MG/5ML IJ SOLN
INTRAMUSCULAR | Status: DC | PRN
  Administered 2015-10-27: 1 mg via INTRAVENOUS

## 2015-10-27 MED ORDER — METOPROLOL SUCCINATE ER 25 MG PO TB24
12.5000 mg | ORAL_TABLET | Freq: Every day | ORAL | Status: DC
  Administered 2015-10-29: 12.5 mg via ORAL
  Filled 2015-10-27 (×2): qty 1

## 2015-10-27 MED ORDER — DEXAMETHASONE SODIUM PHOSPHATE 10 MG/ML IJ SOLN
INTRAMUSCULAR | Status: AC
Start: 2015-10-27 — End: 2015-10-27
  Filled 2015-10-27: qty 1

## 2015-10-27 MED ORDER — DEXAMETHASONE SODIUM PHOSPHATE 10 MG/ML IJ SOLN
10.0000 mg | Freq: Once | INTRAMUSCULAR | Status: AC
  Administered 2015-10-27: 10 mg via INTRAVENOUS

## 2015-10-27 MED ORDER — NITROGLYCERIN 0.4 MG SL SUBL: 0.4000 mg | SUBLINGUAL_TABLET | SUBLINGUAL | Status: DC | PRN

## 2015-10-27 MED ORDER — PROPOFOL 500 MG/50ML IV EMUL
INTRAVENOUS | Status: DC | PRN
  Administered 2015-10-27: 50 ug/kg/min via INTRAVENOUS

## 2015-10-27 MED ORDER — ONDANSETRON HCL 4 MG/2ML IJ SOLN
INTRAMUSCULAR | Status: DC | PRN
  Administered 2015-10-27: 4 mg via INTRAVENOUS

## 2015-10-27 MED ORDER — ONDANSETRON HCL 4 MG/2ML IJ SOLN
INTRAMUSCULAR | Status: AC
  Filled 2015-10-27: qty 2

## 2015-10-27 MED ORDER — FENTANYL CITRATE (PF) 100 MCG/2ML IJ SOLN
INTRAMUSCULAR | Status: DC | PRN
  Administered 2015-10-27: 50 ug via INTRAVENOUS

## 2015-10-27 MED ORDER — CEFAZOLIN SODIUM-DEXTROSE 2-4 GM/100ML-% IV SOLN
INTRAVENOUS | Status: AC
  Filled 2015-10-27: qty 100

## 2015-10-27 MED ORDER — DEXAMETHASONE SODIUM PHOSPHATE 10 MG/ML IJ SOLN
10.0000 mg | Freq: Once | INTRAMUSCULAR | Status: AC
  Administered 2015-10-28: 10 mg via INTRAVENOUS
  Filled 2015-10-27: qty 1

## 2015-10-27 MED ORDER — MORPHINE SULFATE (PF) 2 MG/ML IV SOLN: 1.0000 mg | INTRAVENOUS | Status: DC | PRN

## 2015-10-27 MED ORDER — METOCLOPRAMIDE HCL 5 MG PO TABS: 5.0000 mg | ORAL_TABLET | Freq: Three times a day (TID) | ORAL | Status: DC | PRN

## 2015-10-27 MED ORDER — GLYCOPYRROLATE 0.2 MG/ML IJ SOLN
INTRAMUSCULAR | Status: DC | PRN
  Administered 2015-10-27: 0.2 mg via INTRAVENOUS

## 2015-10-27 MED ORDER — TRAMADOL HCL 50 MG PO TABS
50.0000 mg | ORAL_TABLET | Freq: Four times a day (QID) | ORAL | Status: DC | PRN
  Administered 2015-10-28: 100 mg via ORAL
  Filled 2015-10-27: qty 2

## 2015-10-27 MED ORDER — METHOCARBAMOL 500 MG PO TABS
500.0000 mg | ORAL_TABLET | Freq: Four times a day (QID) | ORAL | Status: DC | PRN
  Administered 2015-10-28 – 2015-10-29 (×3): 500 mg via ORAL
  Filled 2015-10-27 (×3): qty 1

## 2015-10-27 MED ORDER — BUPIVACAINE IN DEXTROSE 0.75-8.25 % IT SOLN
INTRATHECAL | Status: DC | PRN
  Administered 2015-10-27: 1.8 mL via INTRATHECAL

## 2015-10-27 MED ORDER — DIPHENHYDRAMINE HCL 12.5 MG/5ML PO ELIX: 12.5000 mg | ORAL_SOLUTION | ORAL | Status: DC | PRN

## 2015-10-27 MED ORDER — SODIUM CHLORIDE 0.9 % IV SOLN
2000.0000 mg | Freq: Once | INTRAVENOUS | Status: AC
  Administered 2015-10-27: 2000 mg via TOPICAL
  Filled 2015-10-27: qty 20

## 2015-10-27 MED ORDER — TICAGRELOR 90 MG PO TABS
90.0000 mg | ORAL_TABLET | Freq: Two times a day (BID) | ORAL | Status: DC
  Administered 2015-10-28 – 2015-10-29 (×3): 90 mg via ORAL
  Filled 2015-10-27 (×3): qty 1

## 2015-10-27 MED ORDER — ONDANSETRON HCL 4 MG PO TABS: 4.0000 mg | ORAL_TABLET | Freq: Four times a day (QID) | ORAL | Status: DC | PRN

## 2015-10-27 MED ORDER — METOCLOPRAMIDE HCL 5 MG/ML IJ SOLN: 5.0000 mg | Freq: Three times a day (TID) | INTRAMUSCULAR | Status: DC | PRN

## 2015-10-27 MED ORDER — PHENYLEPHRINE 40 MCG/ML (10ML) SYRINGE FOR IV PUSH (FOR BLOOD PRESSURE SUPPORT)
PREFILLED_SYRINGE | INTRAVENOUS | Status: AC
  Filled 2015-10-27: qty 10

## 2015-10-27 MED ORDER — MIDAZOLAM HCL 2 MG/2ML IJ SOLN
INTRAMUSCULAR | Status: AC
Start: 2015-10-27 — End: 2015-10-27
  Filled 2015-10-27: qty 2

## 2015-10-27 MED ORDER — POLYETHYLENE GLYCOL 3350 17 G PO PACK: 17.0000 g | PACK | Freq: Every day | ORAL | Status: DC | PRN

## 2015-10-27 MED ORDER — FLEET ENEMA 7-19 GM/118ML RE ENEM: 1.0000 | ENEMA | Freq: Once | RECTAL | Status: DC | PRN

## 2015-10-27 MED ORDER — DEXTROSE 5 % IV SOLN
500.0000 mg | Freq: Four times a day (QID) | INTRAVENOUS | Status: DC | PRN
  Administered 2015-10-27: 500 mg via INTRAVENOUS
  Filled 2015-10-27: qty 5
  Filled 2015-10-27: qty 550

## 2015-10-27 MED ORDER — PROPOFOL 10 MG/ML IV BOLUS
INTRAVENOUS | Status: AC
  Filled 2015-10-27: qty 60

## 2015-10-27 MED ORDER — LIDOCAINE HCL (CARDIAC) 20 MG/ML IV SOLN
INTRAVENOUS | Status: AC
  Filled 2015-10-27: qty 5

## 2015-10-27 MED ORDER — BISACODYL 10 MG RE SUPP: 10.0000 mg | Freq: Every day | RECTAL | Status: DC | PRN

## 2015-10-27 MED ORDER — ACETAMINOPHEN 500 MG PO TABS
1000.0000 mg | ORAL_TABLET | Freq: Four times a day (QID) | ORAL | Status: AC
  Administered 2015-10-27 – 2015-10-28 (×4): 1000 mg via ORAL
  Filled 2015-10-27 (×4): qty 2

## 2015-10-27 MED ORDER — SODIUM CHLORIDE 0.9 % IJ SOLN
INTRAMUSCULAR | Status: DC | PRN
  Administered 2015-10-27: 30 mL

## 2015-10-27 MED ORDER — MIRABEGRON ER 50 MG PO TB24
50.0000 mg | ORAL_TABLET | Freq: Every day | ORAL | Status: DC
  Filled 2015-10-27 (×3): qty 1

## 2015-10-27 MED ORDER — ACETAMINOPHEN 10 MG/ML IV SOLN
1000.0000 mg | Freq: Once | INTRAVENOUS | Status: AC
  Administered 2015-10-27: 1000 mg via INTRAVENOUS
  Filled 2015-10-27: qty 100

## 2015-10-27 MED ORDER — CHLORHEXIDINE GLUCONATE 4 % EX LIQD: 60.0000 mL | Freq: Once | CUTANEOUS | Status: DC

## 2015-10-27 MED ORDER — PHENYLEPHRINE HCL 10 MG/ML IJ SOLN
INTRAMUSCULAR | Status: DC | PRN
  Administered 2015-10-27 (×2): 80 ug via INTRAVENOUS
  Administered 2015-10-27 (×4): 40 ug via INTRAVENOUS

## 2015-10-27 MED ORDER — GLYCOPYRROLATE 0.2 MG/ML IJ SOLN
INTRAMUSCULAR | Status: AC
  Filled 2015-10-27: qty 1

## 2015-10-27 MED ORDER — PHENOL 1.4 % MT LIQD: 1.0000 | OROMUCOSAL | Status: DC | PRN

## 2015-10-27 MED ORDER — BUPIVACAINE HCL (PF) 0.25 % IJ SOLN
INTRAMUSCULAR | Status: AC
  Filled 2015-10-27: qty 30

## 2015-10-27 MED ORDER — PHENYLEPHRINE 40 MCG/ML (10ML) SYRINGE FOR IV PUSH (FOR BLOOD PRESSURE SUPPORT)
PREFILLED_SYRINGE | INTRAVENOUS | Status: AC
  Filled 2015-10-27: qty 20

## 2015-10-27 MED ORDER — CEFAZOLIN SODIUM-DEXTROSE 2-4 GM/100ML-% IV SOLN
2.0000 g | INTRAVENOUS | Status: AC
  Administered 2015-10-27: 2 g via INTRAVENOUS
  Filled 2015-10-27: qty 100

## 2015-10-27 SURGICAL SUPPLY — 51 items
BAG DECANTER FOR FLEXI CONT (MISCELLANEOUS) ×3 IMPLANT
BAG SPEC THK2 15X12 ZIP CLS (MISCELLANEOUS) ×1
BAG ZIPLOCK 12X15 (MISCELLANEOUS) ×3 IMPLANT
BANDAGE ACE 6X5 VEL STRL LF (GAUZE/BANDAGES/DRESSINGS) ×3 IMPLANT
BLADE SAG 18X100X1.27 (BLADE) ×3 IMPLANT
BLADE SAW SGTL 11.0X1.19X90.0M (BLADE) ×3 IMPLANT
BOWL SMART MIX CTS (DISPOSABLE) ×3 IMPLANT
CAP KNEE TOTAL 3 SIGMA ×2 IMPLANT
CEMENT HV SMART SET (Cement) ×6 IMPLANT
CLOSURE WOUND 1/2 X4 (GAUZE/BANDAGES/DRESSINGS) ×2
CLOTH BEACON ORANGE TIMEOUT ST (SAFETY) ×3 IMPLANT
CUFF TOURN SGL QUICK 34 (TOURNIQUET CUFF) ×3
CUFF TRNQT CYL 34X4X40X1 (TOURNIQUET CUFF) ×1 IMPLANT
DECANTER SPIKE VIAL GLASS SM (MISCELLANEOUS) ×3 IMPLANT
DRAPE U-SHAPE 47X51 STRL (DRAPES) ×3 IMPLANT
DRSG ADAPTIC 3X8 NADH LF (GAUZE/BANDAGES/DRESSINGS) ×3 IMPLANT
DRSG PAD ABDOMINAL 8X10 ST (GAUZE/BANDAGES/DRESSINGS) ×3 IMPLANT
DURAPREP 26ML APPLICATOR (WOUND CARE) ×3 IMPLANT
ELECT REM PT RETURN 9FT ADLT (ELECTROSURGICAL) ×3
ELECTRODE REM PT RTRN 9FT ADLT (ELECTROSURGICAL) ×1 IMPLANT
EVACUATOR 1/8 PVC DRAIN (DRAIN) ×3 IMPLANT
GAUZE SPONGE 4X4 12PLY STRL (GAUZE/BANDAGES/DRESSINGS) ×3 IMPLANT
GLOVE BIO SURGEON STRL SZ7.5 (GLOVE) IMPLANT
GLOVE BIO SURGEON STRL SZ8 (GLOVE) ×3 IMPLANT
GLOVE BIOGEL PI IND STRL 6.5 (GLOVE) IMPLANT
GLOVE BIOGEL PI IND STRL 8 (GLOVE) ×1 IMPLANT
GLOVE BIOGEL PI INDICATOR 6.5 (GLOVE)
GLOVE BIOGEL PI INDICATOR 8 (GLOVE) ×2
GLOVE SURG SS PI 6.5 STRL IVOR (GLOVE) IMPLANT
GOWN STRL REUS W/TWL LRG LVL3 (GOWN DISPOSABLE) ×3 IMPLANT
GOWN STRL REUS W/TWL XL LVL3 (GOWN DISPOSABLE) IMPLANT
HANDPIECE INTERPULSE COAX TIP (DISPOSABLE) ×3
IMMOBILIZER KNEE 20 (SOFTGOODS) ×3
IMMOBILIZER KNEE 20 THIGH 36 (SOFTGOODS) ×1 IMPLANT
MANIFOLD NEPTUNE II (INSTRUMENTS) ×3 IMPLANT
NS IRRIG 1000ML POUR BTL (IV SOLUTION) ×3 IMPLANT
PACK TOTAL KNEE CUSTOM (KITS) ×3 IMPLANT
PADDING CAST COTTON 6X4 STRL (CAST SUPPLIES) ×9 IMPLANT
POSITIONER SURGICAL ARM (MISCELLANEOUS) ×3 IMPLANT
SET HNDPC FAN SPRY TIP SCT (DISPOSABLE) ×1 IMPLANT
STRIP CLOSURE SKIN 1/2X4 (GAUZE/BANDAGES/DRESSINGS) ×4 IMPLANT
SUT MNCRL AB 4-0 PS2 18 (SUTURE) ×3 IMPLANT
SUT VIC AB 2-0 CT1 27 (SUTURE) ×9
SUT VIC AB 2-0 CT1 TAPERPNT 27 (SUTURE) ×3 IMPLANT
SUT VLOC 180 0 24IN GS25 (SUTURE) ×3 IMPLANT
SYR 50ML LL SCALE MARK (SYRINGE) ×3 IMPLANT
TRAY FOLEY W/METER SILVER 14FR (SET/KITS/TRAYS/PACK) ×3 IMPLANT
TRAY FOLEY W/METER SILVER 16FR (SET/KITS/TRAYS/PACK) ×3 IMPLANT
WATER STERILE IRR 1500ML POUR (IV SOLUTION) ×3 IMPLANT
WRAP KNEE MAXI GEL POST OP (GAUZE/BANDAGES/DRESSINGS) ×3 IMPLANT
YANKAUER SUCT BULB TIP 10FT TU (MISCELLANEOUS) ×3 IMPLANT

## 2015-10-27 NOTE — Interval H&P Note (Signed)
History and Physical Interval Note:  10/27/2015 6:46 AM  Stuart Flores  has presented today for surgery, with the diagnosis of OA RIGHT KNEE   The various methods of treatment have been discussed with the patient and family. After consideration of risks, benefits and other options for treatment, the patient has consented to  Procedure(s): TOTAL RIGHT KNEE ARTHROPLASTY (Right) as a surgical intervention .  The patient's history has been reviewed, patient examined, no change in status, stable for surgery.  I have reviewed the patient's chart and labs.  Questions were answered to the patient's satisfaction.     Loanne Drilling

## 2015-10-27 NOTE — Evaluation (Signed)
Physical Therapy Evaluation Patient Details Name: Stuart Flores MRN: [id number] DOB: [birth year] Today's Date: 10/27/2015   History of Present Illness  Pt is a 82 year old male s/p R TKA  Clinical Impression  Pt is s/p R TKA resulting in the deficits listed below (see PT Problem List).  Pt will benefit from skilled PT to increase their independence and safety with mobility to allow discharge to the venue listed below.  Pt ambulated short distance POD #0 and plans to d/c home with spouse and possibly daughter assist.     Follow Up Recommendations Supervision for mobility/OOB;Home health PT    Equipment Recommendations  None recommended by PT    Recommendations for Other Services       Precautions / Restrictions Precautions Precautions: Fall;Knee Required Braces or Orthoses: Knee Immobilizer - Right Restrictions Weight Bearing Restrictions: No Other Position/Activity Restrictions: WBAT      Mobility  Bed Mobility Overal bed mobility: Needs Assistance Bed Mobility: Supine to Sit;Sit to Supine     Supine to sit: HOB elevated;Min guard Sit to supine: Min guard   General bed mobility comments: verbal cues for self assist  Transfers Overall transfer level: Needs assistance Equipment used: Rolling walker (2 wheeled) Transfers: Sit to/from Stand Sit to Stand: Min guard         General transfer comment: verbal cues for UE and LE positioning  Ambulation/Gait Ambulation/Gait assistance: Min guard Ambulation Distance (Feet): 70 Feet Assistive device: Rolling walker (2 wheeled) Gait Pattern/deviations: Step-to pattern;Decreased stance time - right;Antalgic     General Gait Details: verbal cues for sequence, step length, RW positioning, posture  Stairs            Wheelchair Mobility    Modified Rankin (Stroke Patients Only)       Balance                                             Pertinent Vitals/Pain Pain Assessment: 0-10 Pain  Score: 5  Pain Location: R knee Pain Descriptors / Indicators: Aching;Sore Pain Intervention(s): Monitored during session;Repositioned;Ice applied;Premedicated before session;Limited activity within patient's tolerance    Home Living Family/patient expects to be discharged to:: Private residence Living Arrangements: Spouse/significant other Available Help at Discharge: Family;Available 24 hours/day Type of Home: House Home Access: Stairs to enter Entrance Stairs-Rails: Right Entrance Stairs-Number of Steps: 3 Home Layout: One level Home Equipment: Walker - 2 wheels;Bedside commode      Prior Function Level of Independence: Independent               Hand Dominance        Extremity/Trunk Assessment               Lower Extremity Assessment: RLE deficits/detail RLE Deficits / Details: able to perform SLR, maintained KI for safety, ROM TBA       Communication   Communication: No difficulties  Cognition Arousal/Alertness: Awake/alert Behavior During Therapy: WFL for tasks assessed/performed Overall Cognitive Status: Within Functional Limits for tasks assessed                      General Comments      Exercises        Assessment/Plan    PT Assessment Patient needs continued PT services  PT Diagnosis Difficulty walking;Acute pain   PT Problem List Decreased strength;Decreased range of motion;Decreased  knowledge of use of DME;Decreased knowledge of precautions;Pain;Decreased mobility  PT Treatment Interventions Functional mobility training;Stair training;Gait training;DME instruction;Therapeutic exercise;Therapeutic activities;Patient/family education   PT Goals (Current goals can be found in the Care Plan section) Acute Rehab PT Goals PT Goal Formulation: With patient Time For Goal Achievement: 10/31/15 Potential to Achieve Goals: Good    Frequency 7X/week   Barriers to discharge        Co-evaluation               End of Session  Equipment Utilized During Treatment: Gait belt;Right knee immobilizer Activity Tolerance: Patient tolerated treatment well Patient left: in bed;with call bell/phone within reach;with bed alarm set Nurse Communication: Mobility status         Time: 1308-6578 PT Time Calculation (min) (ACUTE ONLY): 18 min   Charges:   PT Evaluation $PT Eval Low Complexity: 1 Procedure     PT G Codes:        Merric Yost,KATHrine E 10/27/2015, 4:47 PM Zenovia Jarred, PT, DPT 10/27/2015 Pager: [phone]

## 2015-10-27 NOTE — Anesthesia Postprocedure Evaluation (Signed)
Anesthesia Post Note  Patient: Stuart Flores  Procedure(s) Performed: Procedure(s) (LRB): TOTAL RIGHT KNEE ARTHROPLASTY (Right)  Patient location during evaluation: PACU Anesthesia Type: Spinal and MAC Level of consciousness: awake and alert Pain management: pain level controlled Vital Signs Assessment: post-procedure vital signs reviewed and stable Respiratory status: spontaneous breathing and respiratory function stable Cardiovascular status: blood pressure returned to baseline and stable Postop Assessment: spinal receding Anesthetic complications: no    Last Vitals:  Vitals:   10/27/15 1130 10/27/15 1145  BP: 114/75 111/70  Pulse: (!) 50 (!) 51  Resp: 16 12  Temp:  (!) 35.5 C    Last Pain:  Vitals:   10/27/15 0711  TempSrc: Oral                 Kyndell Zeiser,W. EDMOND

## 2015-10-27 NOTE — Op Note (Signed)
Pre-operative diagnosis- Osteoarthritis  Right knee(s)  Post-operative diagnosis- Osteoarthritis Right knee(s)  Procedure-  Right  Total Knee Arthroplasty  Surgeon- [name]. Maleki Hippe, MD  Assistant- Dimitri Ped, PA-C   Anesthesia-  Spinal  EBL-* No blood loss amount entered *   Drains Hemovac  Tourniquet time-  32 minutes @ 300 mm Hg   Complications- None  Condition-PACU - hemodynamically stable.   Brief Clinical Note  Stuart Flores is a 82 y.o. year old male with end stage OA of his right knee with progressively worsening pain and dysfunction. He has constant pain, with activity and at rest and significant functional deficits with difficulties even with ADLs. He has had extensive non-op management including analgesics, injections of cortisone and viscosupplements, and home exercise program, but remains in significant pain with significant dysfunction. Radiographs show bone on bone arthritis lateral. He presents now for right Total Knee Arthroplasty.    Procedure in detail---   The patient is brought into the operating room and positioned supine on the operating table. After successful administration of  Spinal,   a tourniquet is placed high on the  Right thigh(s) and the lower extremity is prepped and draped in the usual sterile fashion. Time out is performed by the operating team and then the  Right lower extremity is wrapped in Esmarch, knee flexed and the tourniquet inflated to 300 mmHg.       A midline incision is made with a ten blade through the subcutaneous tissue to the level of the extensor mechanism. A fresh blade is used to make a medial parapatellar arthrotomy. Soft tissue over the proximal medial tibia is subperiosteally elevated to the joint line with a knife and into the semimembranosus bursa with a Cobb elevator. Soft tissue over the proximal lateral tibia is elevated with attention being paid to avoiding the patellar tendon on the tibial tubercle. The patella is  everted, knee flexed 90 degrees and the ACL and PCL are removed. Findings are bone on bone lateral and patellofemoral with large global osteophytes.        The drill is used to create a starting hole in the distal femur and the canal is thoroughly irrigated with sterile saline to remove the fatty contents. The 5 degree Right  valgus alignment guide is placed into the femoral canal and the distal femoral cutting block is pinned to remove 10 mm off the distal femur. Resection is made with an oscillating saw.      The tibia is subluxed forward and the menisci are removed. The extramedullary alignment guide is placed referencing proximally at the medial aspect of the tibial tubercle and distally along the second metatarsal axis and tibial crest. The block is pinned to remove 2mm off the more deficient medial  side. Resection is made with an oscillating saw. Size 5is the most appropriate size for the tibia and the proximal tibia is prepared with the modular drill and keel punch for that size.      The femoral sizing guide is placed and size 4 is most appropriate. Rotation is marked off the epicondylar axis and confirmed by creating a rectangular flexion gap at 90 degrees. The size 4 cutting block is pinned in this rotation and the anterior, posterior and chamfer cuts are made with the oscillating saw. The intercondylar block is then placed and that cut is made.      Trial size 5 tibial component, trial size 4 posterior stabilized femur and a 12.5  mm posterior stabilized  rotating platform insert trial is placed. Full extension is achieved with excellent varus/valgus and anterior/posterior balance throughout full range of motion. The patella is everted and thickness measured to be 24  mm. Free hand resection is taken to 14 mm, a 38 template is placed, lug holes are drilled, trial patella is placed, and it tracks normally. Osteophytes are removed off the posterior femur with the trial in place. All trials are removed  and the cut bone surfaces prepared with pulsatile lavage. Cement is mixed and once ready for implantation, the size 5 tibial implant, size  4 posterior stabilized femoral component, and the size 38 patella are cemented in place and the patella is held with the clamp. The trial insert is placed and the knee held in full extension. The Exparel (20 ml mixed with 30 ml saline) and .25% Bupivicaine, are injected into the extensor mechanism, posterior capsule, medial and lateral gutters and subcutaneous tissues.  All extruded cement is removed and once the cement is hard the permanent 12.5 mm posterior stabilized rotating platform insert is placed into the tibial tray.      The wound is copiously irrigated with saline solution and the extensor mechanism closed over a hemovac drain with #1 V-loc suture. The tourniquet is released for a total tourniquet time of 32  minutes. Flexion against gravity is 140 degrees and the patella tracks normally. Subcutaneous tissue is closed with 2.0 vicryl and subcuticular with running 4.0 Monocryl. The incision is cleaned and dried and steri-strips and a bulky sterile dressing are applied. The limb is placed into a knee immobilizer and the patient is awakened and transported to recovery in stable condition.      Please note that a surgical assistant was a medical necessity for this procedure in order to perform it in a safe and expeditious manner. Surgical assistant was necessary to retract the ligaments and vital neurovascular structures to prevent injury to them and also necessary for proper positioning of the limb to allow for anatomic placement of the prosthesis.   [name] Derryl Uher, MD    10/27/2015, 10:19 AM

## 2015-10-27 NOTE — Transfer of Care (Signed)
Immediate Anesthesia Transfer of Care Note  Patient: Stuart Flores  Procedure(s) Performed: Procedure(s): TOTAL RIGHT KNEE ARTHROPLASTY (Right)  Patient Location: PACU  Anesthesia Type:Spinal  Level of Consciousness:  sedated, patient cooperative and responds to stimulation  Airway & Oxygen Therapy:Patient Spontanous Breathing and Patient connected to face mask oxgen  Post-op Assessment:  Report given to PACU RN and Post -op Vital signs reviewed and stable  Post vital signs:  Reviewed and stable  Last Vitals:  Vitals:   10/27/15 0711  BP: 114/66  Pulse: (!) 50  Resp: 18  Temp: 36.8 C    Complications: No apparent anesthesia complications

## 2015-10-27 NOTE — H&P (View-Only) (Signed)
Stuart Flores DOB: Dec 26, 1933 Married / Language: [name] / Race: White Male Date of Admission:  10/27/2015 CC:  Right Knee Pain History of Present Illness The patient is a 82 year old male who comes in for a preoperative History and Physical. The patient is scheduled for a right total knee arthroplasty to be performed by Dr. Gus Flores. [name], MD at [facility] on 10/27/2015. The patient is a 82 year old male who presents today for follow up of their knee. The patient is being followed for their bilateral knee pain and osteoarthritis. They are now 1 year(s) out from when symptoms began. Symptoms reported today include: pain (aching pain weightbearing. radiates down both legs.). The patient feels that they are doing poorly and report their pain level to be moderate to severe. The following medication has been used for pain control: none. The patient has not gotten any relief of their symptoms with Cortisone injections (relief did not last long). The patient indicates that they have questions or concerns today regarding pain and their progress at this point (pt. is here today to discuss sx). Note for "Follow-up Knee": pt. was seen by Dr. Ranell Flores last regarding his knees. Pt. had right knee scope 2006. left knee scope 2002 He was referred over by Dr. Ranell Flores who saw him for ongoing knee pain. The knee has been hurting him for five years or more and has been gradually getting more over the past year. There has been no specific injury or accident. He states they have just been progressive in nature. He does have pain with any standing any length of time, especially at social functions. He does a lot of bird watching and bird photography. When he is standing around for a long time, they will hurt him when he goes to move. He had one injection in the past by Dr. Simonne Flores. He also has some gel series in the past, which he states provided him no benefit. He has undergone knee scopes, which we have  documented in 2006 on the right knee and 2002 on the left knee. Unfortunately, the knees have progressively gotten worse. He states they are both equally bad. He denies any swelling, popping or catching with them. There is no buckling, but the big thing is more stiffness and pain. He does have a little bit of difficulty getting up and out of the bed and out of car, but he says he can get up and down steps still okay. Stuart Flores states he is fairly active, he enjoys traveling with his wife and he has been to multiple foreign trips abroad, and he still has a couple scheduled for this fall and next winter. AP both knees and lateral shows that he has lateral compartment arthritis, bone on bone, right worse than left knee with patellofemoral arthritis also. At this point, the most predictable means of improving pain and function is total knee arthroplasty. The procedure, risks, potential complications and rehab course are discussed in detail and the patient elects to proceed. He feels that he would like to get his knees replaced. Recommended doing the right one first since the arthritis is worse and he is more symptomatic. They have been treated conservatively in the past for the above stated problem and despite conservative measures, they continue to have progressive pain and severe functional limitations and dysfunction. They have failed non-operative management including home exercise, medications, and injections. It is felt that they would benefit from undergoing total joint replacement. Risks and benefits  of the procedure have been discussed with the patient and they elect to proceed with surgery. There are no active contraindications to surgery such as ongoing infection or rapidly progressive neurological disease.  Problem List/Past Medical Shoulder pain (M25.519)  left Arm pain (M79.603)  left Primary osteoarthritis of both knees (M17.0)  Degenerative lumbar disc (M51.36)  Acute bilateral low back  pain without sciatica (M54.5)  Coronary Artery Disease/Heart Disease  Hyperlipidemia  Glaucoma  Cataract  Allergies  No Known Drug Allergies   Family History  Cancer  father and grandmother fathers side Rheumatoid Arthritis  Mother. mother  Social History  Alcohol use  current drinker; drinks beer, wine and hard liquor; 8-14 per week Children  4 Current work status  retired Financial planner (Currently)  no Drug/Alcohol Rehab (Previously)  no Exercise  Exercises daily; does running / walking and gym / weights Illicit drug use  no Living situation  live with spouse Marital status  married Number of flights of stairs before winded  greater than 5 Pain Contract  no Tobacco / smoke exposure  no Tobacco use  Former smoker. former smoker; smoke(d) 1 pack(s) per day Post-Surgical Plans  Home Advance Directives  Living Will, Healthcare POA  Medication History Atorvastatin Calcium (  Tablet, Oral) Active. Brilinta (  Tablet, Oral) Active. Nitroglycerin (0.4MG  Tab Sublingual, Sublingual) Active. Metoprolol Succinate ER (  Tablet ER 24HR, Oral) Active. Probiotic Daily (Oral) Active. Myrbetriq (  Tablet ER 24HR, Oral) Active. Aspirin (  Tablet, 1 (one) Oral) Active. Glucosamine Complex (1 (one) Oral) Active. One-A-Day Mens (1 (one) Oral) Active.  Past Surgical History Heart Stents  Two Bilateral Knee Scopes  2002 and 2006   Review of Systems General Not Present- Chills, Fatigue, Fever, Memory Loss, Night Sweats, Weight Gain and Weight Loss. Skin Not Present- Eczema, Hives, Itching, Lesions and Rash. HEENT Not Present- Dentures, Double Vision, Headache, Hearing Loss, Tinnitus and Visual Loss. Respiratory Not Present- Allergies, Chronic Cough, Coughing up blood, Shortness of breath at rest and Shortness of breath with exertion. Cardiovascular Not Present- Chest Pain, Difficulty Breathing Lying Down, Murmur, Palpitations,  Racing/skipping heartbeats and Swelling. Gastrointestinal Not Present- Abdominal Pain, Bloody Stool, Constipation, Diarrhea, Difficulty Swallowing, Heartburn, Jaundice, Loss of appetitie, Nausea and Vomiting. Male Genitourinary Not Present- Blood in Urine, Discharge, Flank Pain, Incontinence, Painful Urination, Urgency, Urinary frequency, Urinary Retention, Urinating at Night and Weak urinary stream. Musculoskeletal Present- Joint Pain. Not Present- Back Pain, Joint Swelling, Morning Stiffness, Muscle Pain, Muscle Weakness and Spasms. Neurological Not Present- Blackout spells, Difficulty with balance, Dizziness, Paralysis, Tremor and Weakness. Psychiatric Not Present- Insomnia.  Vitals Weight: 167 lb Height: 72in Weight was reported by patient. Height was reported by patient. Body Surface Area: 1.97 m Body Mass Index: 22.65 kg/m  Pulse: 56 (Regular)  BP: 118/64 (Sitting, Right Arm, Standard)  Physical Exam  General Mental Status -Alert, cooperative and good historian. General Appearance-pleasant, Not in acute distress. Orientation-Oriented X3. Build & Nutrition-Well nourished and Well developed.  Head and Neck Head-normocephalic, atraumatic . Neck Global Assessment - supple, no bruit auscultated on the right, no bruit auscultated on the left.  Eye Vision-Wears corrective lenses. Pupil - Bilateral-Regular and Round. Motion - Bilateral-EOMI.  Chest and Lung Exam Auscultation Breath sounds - clear at anterior chest wall and clear at posterior chest wall. Adventitious sounds - No Adventitious sounds.  Cardiovascular Auscultation Rhythm - Regular rate and rhythm. Heart Sounds - S1 WNL and S2 WNL. Murmurs & Other Heart Sounds - Auscultation of the heart reveals - No Murmurs.  Abdomen Palpation/Percussion Tenderness - Abdomen is non-tender to palpation. Rigidity (guarding) - Abdomen is soft. Auscultation Auscultation of the abdomen reveals - Bowel  sounds normal.  Male Genitourinary Note: Not done, not pertinent to present illness   Musculoskeletal Note: On exam, he is in no distress. His hip show normal range of motion, no discomfort. His knee show no effusion. Right knee range of motion is about 0 to 125. The left knee about 0 to 130. He has moderate crepitus on range of motion of each knee. He has some tenderness of lateral greater than medial with no instability. There is slight valgus on the right knee. Pulse, sensation and motor intact.  RADIOGRAPHS AP both knees and lateral shows that he has lateral compartment arthritis, bone on bone, right worse than left knee with patellofemoral arthritis also.  Assessment & Plan Primary osteoarthritis of right knee (M17.11) Primary osteoarthritis of one knee, left (M17.12)  Note:Surgical Plans: Right Total Knee Replacement  Disposition: Home  PCP: Dr. Felipa Eth - 'He is advised to hold the Brilinta.Marland KitchenMarland KitchenHe is advised to continue the aspirin indefinitley and not to hold if for surgery.'  Topical TXA  Anesthesia Issues: None  [facility]IT[facility]UD[facility] PATIENT Traditional Therapy - HHPT  Signed electronically by Beckey Rutter, III PA-C

## 2015-10-27 NOTE — Anesthesia Procedure Notes (Signed)
Spinal  Patient location during procedure: OR Start time: 10/27/2015 9:19 AM End time: 10/27/2015 9:24 AM Staffing Anesthesiologist: Roderic Palau Resident/CRNA: Darlys Gales R Performed: resident/CRNA  Preanesthetic Checklist Completed: patient identified, site marked, surgical consent, pre-op evaluation, timeout performed, IV checked, risks and benefits discussed and monitors and equipment checked Spinal Block Patient position: sitting Prep: ChloraPrep Patient monitoring: heart rate, continuous pulse ox and blood pressure Location: L3-4 Injection technique: single-shot Needle Needle type: Spinocan  Needle gauge: 22 G Needle length: 9 cm Needle insertion depth: 7 cm Assessment Sensory level: T6 Additional Notes Expiration date of kit checked and confirmed. Patient tolerated procedure well, without complications.

## 2015-10-28 LAB — CBC
HEMATOCRIT: 30.7 % — AB (ref 39.0–52.0)
Hemoglobin: 10.4 g/dL — ABNORMAL LOW (ref 13.0–17.0)
MCH: 31.1 pg (ref 26.0–34.0)
MCHC: 33.9 g/dL (ref 30.0–36.0)
MCV: 91.9 fL (ref 78.0–100.0)
Platelets: 241 10*3/uL (ref 150–400)
RBC: 3.34 MIL/uL — AB (ref 4.22–5.81)
RDW: 14.7 % (ref 11.5–15.5)
WBC: 13.1 10*3/uL — AB (ref 4.0–10.5)

## 2015-10-28 LAB — BASIC METABOLIC PANEL
ANION GAP: 3 — AB (ref 5–15)
BUN: 18 mg/dL (ref 6–20)
CHLORIDE: 109 mmol/L (ref 101–111)
CO2: 27 mmol/L (ref 22–32)
Calcium: 8.1 mg/dL — ABNORMAL LOW (ref 8.9–10.3)
Creatinine, Ser: 0.93 mg/dL (ref 0.61–1.24)
GFR calc Af Amer: >60 mL/min (ref >60)
GFR calc non Af Amer: >60 mL/min (ref >60)
GLUCOSE: 127 mg/dL — AB (ref 65–99)
POTASSIUM: 4.6 mmol/L (ref 3.5–5.1)
Sodium: 139 mmol/L (ref 135–145)

## 2015-10-28 MED ORDER — TRAMADOL HCL 50 MG PO TABS: 50.0000 mg | ORAL_TABLET | Freq: Four times a day (QID) | ORAL | 1 refills | Status: DC | PRN

## 2015-10-28 MED ORDER — METHOCARBAMOL 500 MG PO TABS: 500.0000 mg | ORAL_TABLET | Freq: Four times a day (QID) | ORAL | 0 refills | Status: DC | PRN

## 2015-10-28 MED ORDER — OXYCODONE HCL 5 MG PO TABS
5.0000 mg | ORAL_TABLET | ORAL | 0 refills | Status: DC | PRN
Start: 2015-10-28 — End: 2016-12-09

## 2015-10-28 NOTE — Discharge Instructions (Signed)
° °Dr. Frank [name] °Total Joint Specialist °[facility] °[address] °[place], [place] [address] °[phone] ° °TOTAL KNEE REPLACEMENT POSTOPERATIVE DIRECTIONS ° °Knee Rehabilitation, Guidelines Following Surgery  °Results after knee surgery are often greatly improved when you follow the exercise, range of motion and muscle strengthening exercises prescribed by your doctor. Safety measures are also important to protect the knee from further injury. Any time any of these exercises cause you to have increased pain or swelling in your knee joint, decrease the amount until you are comfortable again and slowly increase them. If you have problems or questions, call your caregiver or physical therapist for advice.  ° °HOME CARE INSTRUCTIONS  °Remove items at home which could result in a fall. This includes throw rugs or furniture in walking pathways.  °· ICE to the affected knee every three hours for 30 minutes at a time and then as needed for pain and swelling.  Continue to use ice on the knee for pain and swelling from surgery. You may notice swelling that will progress down to the foot and ankle.  This is normal after surgery.  Elevate the leg when you are not up walking on it.   °· Continue to use the breathing machine which will help keep your temperature down.  It is common for your temperature to cycle up and down following surgery, especially at night when you are not up moving around and exerting yourself.  The breathing machine keeps your lungs expanded and your temperature down. °· Do not place pillow under knee, focus on keeping the knee straight while resting ° °DIET °You may resume your previous home diet once your are discharged from the hospital. ° °DRESSING / WOUND CARE / SHOWERING °You may shower 3 days after surgery, but keep the wounds dry during showering.  You may use an occlusive plastic wrap (Press'n Seal for example), NO SOAKING/SUBMERGING IN THE BATHTUB.  If the  bandage gets wet, change with a clean dry gauze.  If the incision gets wet, pat the wound dry with a clean towel. °You may start showering once you are discharged home but do not submerge the incision under water. Just pat the incision dry and apply a dry gauze dressing on daily. °Change the surgical dressing daily and reapply a dry dressing each time. ° °ACTIVITY °Walk with your walker as instructed. °Use walker as long as suggested by your caregivers. °Avoid periods of inactivity such as sitting longer than an hour when not asleep. This helps prevent blood clots.  °You may resume a sexual relationship in one month or when given the OK by your doctor.  °You may return to work once you are cleared by your doctor.  °Do not drive a car for 6 weeks or until released by you surgeon.  °Do not drive while taking narcotics. ° °WEIGHT BEARING °Weight bearing as tolerated with assist device (walker, cane, etc) as directed, use it as long as suggested by your surgeon or therapist, typically at least 4-6 weeks. ° °POSTOPERATIVE CONSTIPATION PROTOCOL °Constipation - defined medically as fewer than three stools per week and severe constipation as less than one stool per week. ° °One of the most common issues patients have following surgery is constipation.  Even if you have a regular bowel pattern at home, your normal regimen is likely to be disrupted due to multiple reasons following surgery.  Combination of anesthesia, postoperative narcotics, change in appetite and fluid intake all can affect your bowels.    In order to avoid complications following surgery, here are some recommendations in order to help you during your recovery period. ° °Colace (docusate) - Pick up an over-the-counter form of Colace or another stool softener and take twice a day as long as you are requiring postoperative pain medications.  Take with a full glass of water daily.  If you experience loose stools or diarrhea, hold the colace until you stool forms  back up.  If your symptoms do not get better within 1 week or if they get worse, check with your doctor. ° °Dulcolax (bisacodyl) - Pick up over-the-counter and take as directed by the product packaging as needed to assist with the movement of your bowels.  Take with a full glass of water.  Use this product as needed if not relieved by Colace only.  ° °MiraLax (polyethylene glycol) - Pick up over-the-counter to have on hand.  MiraLax is a solution that will increase the amount of water in your bowels to assist with bowel movements.  Take as directed and can mix with a glass of water, juice, soda, coffee, or tea.  Take if you go more than two days without a movement. °Do not use MiraLax more than once per day. Call your doctor if you are still constipated or irregular after using this medication for 7 days in a row. ° °If you continue to have problems with postoperative constipation, please contact the office for further assistance and recommendations.  If you experience "the worst abdominal pain ever" or develop nausea or vomiting, please contact the office immediatly for further recommendations for treatment. ° °ITCHING ° If you experience itching with your medications, try taking only a single pain pill, or even half a pain pill at a time.  You can also use Benadryl over the counter for itching or also to help with sleep.  ° °TED HOSE STOCKINGS °Wear the elastic stockings on both legs for three weeks following surgery during the day but you may remove then at night for sleeping. ° °MEDICATIONS °See your medication summary on the “After Visit Summary” that the nursing staff will review with you prior to discharge.  You may have some home medications which will be placed on hold until you complete the course of blood thinner medication.  It is important for you to complete the blood thinner medication as prescribed by your surgeon.  Continue your approved medications as instructed at time of  discharge. ° °PRECAUTIONS °If you experience chest pain or shortness of breath - call 911 immediately for transfer to the hospital emergency department.  °If you develop a fever greater that 101 F, purulent drainage from wound, increased redness or drainage from wound, foul odor from the wound/dressing, or calf pain - CONTACT YOUR SURGEON.   °                                                °FOLLOW-UP APPOINTMENTS °Make sure you keep all of your appointments after your operation with your surgeon and caregivers. You should call the office at the above phone number and make an appointment for approximately two weeks after the date of your surgery or on the date instructed by your surgeon outlined in the "After Visit Summary". ° ° °RANGE OF MOTION AND STRENGTHENING EXERCISES  °Rehabilitation of the knee is important following a knee injury or   an operation. After just a few days of immobilization, the muscles of the thigh which control the knee become weakened and shrink (atrophy). Knee exercises are designed to build up the tone and strength of the thigh muscles and to improve knee motion. Often times heat used for twenty to thirty minutes before working out will loosen up your tissues and help with improving the range of motion but do not use heat for the first two weeks following surgery. These exercises can be done on a training (exercise) mat, on the floor, on a table or on a bed. Use what ever works the best and is most comfortable for you Knee exercises include:  Leg Lifts - While your knee is still immobilized in a splint or cast, you can do straight leg raises. Lift the leg to 60 degrees, hold for 3 sec, and slowly lower the leg. Repeat 10-20 times 2-3 times daily. Perform this exercise against resistance later as your knee gets better.  Quad and Hamstring Sets - Tighten up the muscle on the front of the thigh (Quad) and hold for 5-10 sec. Repeat this 10-20 times hourly. Hamstring sets are done by pushing the  foot backward against an object and holding for 5-10 sec. Repeat as with quad sets.   Leg Slides: Lying on your back, slowly slide your foot toward your buttocks, bending your knee up off the floor (only go as far as is comfortable). Then slowly slide your foot back down until your leg is flat on the floor again.  Angel Wings: Lying on your back spread your legs to the side as far apart as you can without causing discomfort.  A rehabilitation program following serious knee injuries can speed recovery and prevent re-injury in the future due to weakened muscles. Contact your doctor or a physical therapist for more information on knee rehabilitation.   IF YOU ARE TRANSFERRED TO A SKILLED REHAB FACILITY If the patient is transferred to a skilled rehab facility following release from the hospital, a list of the current medications will be sent to the facility for the patient to continue.  When discharged from the skilled rehab facility, please have the facility set up the patient's Home Health Physical Therapy prior to being released. Also, the skilled facility will be responsible for providing the patient with their medications at time of release from the facility to include their pain medication, the muscle relaxants, and their blood thinner medication. If the patient is still at the rehab facility at time of the two week follow up appointment, the skilled rehab facility will also need to assist the patient in arranging follow up appointment in our office and any transportation needs.  MAKE SURE YOU:  Understand these instructions.  Get help right away if you are not doing well or get worse.    Pick up stool softner and laxative for home use following surgery while on pain medications. Do not submerge incision under water. Please use good hand washing techniques while changing dressing each day. May shower starting three days after surgery. Please use a clean towel to pat the incision dry following  showers. Continue to use ice for pain and swelling after surgery. Do not use any lotions or creams on the incision until instructed by your surgeon.  Resume the Brilinta and Aspirin at home following discharge.

## 2015-10-28 NOTE — Care Management Note (Signed)
Case Management Note  Patient Details  Name: [name] MRN: [id number] Date of Birth: 1933/08/03  Subjective/Objective:                  TOTAL RIGHT KNEE ARTHROPLASTY (Right) Action/Plan: Discharge planning Expected Discharge Date:  10/29/15              Expected Discharge Plan:  [place]  In-House Referral:     Discharge planning Services  CM Consult  Post Acute Care Choice:  Durable Medical Equipment, Home Health Choice offered to:  Patient, Spouse, Adult Children  DME Arranged:  3-N-1, Walker rolling DME Agency:  [facility] (now [facility])  [place] Arranged: PT,  [place] Agency:  [place] (now [facility])  Status of Service:  Completed, signed off  If discussed at [employer], dates discussed:    Additional Comments: CM met with pt to offer choice of home health agency.  Pt chooses [facility] to render HHPT.  Referral given to [facility] rep, Tim (on unit).  CM called [facility] DME rep, Jermaine to please deliver the rolling walker and 3n1 to room.  No other CM needs were communicated. Dellie Catholic, RN 10/28/2015, 3:40 PM

## 2015-10-28 NOTE — Progress Notes (Signed)
Physical Therapy Treatment Note    10/28/15 1400  PT Visit Information  Last PT Received On 10/28/15  Assistance Needed +1  History of Present Illness Pt is a 82 year old male s/p R TKA  Subjective Data  Subjective Pt ambulated in hallway and reports pain much improved this afternoon.  pt able to tolerate exercises and provided with HEP handout.  Precautions  Precautions Fall;Knee  Precaution Comments able to perform SLR  Restrictions  Other Position/Activity Restrictions WBAT  Pain Assessment  Pain Assessment 0-10  Pain Score 5  Pain Location R knee  Pain Descriptors / Indicators Aching;Sore  Pain Intervention(s) Limited activity within patient's tolerance;Monitored during session;RN gave pain meds during session;Ice applied  Cognition  Arousal/Alertness Awake/alert  Behavior During Therapy WFL for tasks assessed/performed  Overall Cognitive Status Within Functional Limits for tasks assessed  Bed Mobility  Overal bed mobility Modified Independent  General bed mobility comments increased time however no assist required  Transfers  Overall transfer level Needs assistance  Equipment used Rolling walker (2 wheeled)  Transfers Sit to/from Stand  Sit to Stand Supervision  General transfer comment verbal cues for UE/LE placement  Ambulation/Gait  Ambulation/Gait assistance Min guard;Supervision  Ambulation Distance (Feet) 160 Feet  Assistive device Rolling walker (2 wheeled)  Gait Pattern/deviations Step-through pattern;Antalgic;Decreased stance time - right  General Gait Details verbal cues for RW positioning, posture  Exercises  Exercises Total Joint  Total Joint Exercises  Heel Slides AAROM;Right;10 reps  Ankle Circles/Pumps AROM;10 reps;Both  Quad Sets AROM;10 reps;Right  Short Arc Quad AROM;Right;10 reps  Hip ABduction/ADduction AROM;Right;10 reps  Straight Leg Raises AROM;Right;10 reps  Goniometric ROM approx 50* AAROM knee flexion R knee during heel slides  PT - End  of Session  Activity Tolerance Patient tolerated treatment well  Patient left in bed;with call bell/phone within reach;with nursing/sitter in room  Nurse Communication Mobility status  PT - Assessment/Plan  PT Plan Current plan remains appropriate  PT Frequency (ACUTE ONLY) 7X/week  Follow Up Recommendations Supervision for mobility/OOB;Home health PT  PT equipment None recommended by PT  PT Goal Progression  Progress towards PT goals Progressing toward goals  PT Time Calculation  PT Start Time (ACUTE ONLY) 1337  PT Stop Time (ACUTE ONLY) 1412  PT Time Calculation (min) (ACUTE ONLY) 35 min  PT General Charges  $$ ACUTE PT VISIT 1 Procedure  PT Treatments  $Gait Training 8-22 mins  $Therapeutic Exercise 8-22 mins   Zenovia Jarred, PT, DPT 10/28/2015 Pager: [phone]

## 2015-10-28 NOTE — Progress Notes (Signed)
Physical Therapy Treatment Patient Details Name: MAURICO PERRELL MRN: [id number] DOB: October 27, 1933 Today's Date: 10/28/2015    History of Present Illness Pt is a 82 year old male s/p R TKA    PT Comments    Pt ambulated and practiced safe stair technique.  Pt attempted exercises however reporting too much pain with knee flexion.  Pt states he only received "lesser pain meds" this morning (due to BP being low) so notified RN.  Will check back this afternoon as pt reports he still wishes to d/c home later today.    Follow Up Recommendations  Supervision for mobility/OOB;Home health PT     Equipment Recommendations  None recommended by PT    Recommendations for Other Services       Precautions / Restrictions Precautions Precautions: Fall;Knee Precaution Comments: able to perform SLR Restrictions Weight Bearing Restrictions: No Other Position/Activity Restrictions: WBAT    Mobility  Bed Mobility Overal bed mobility: Needs Assistance Bed Mobility: Supine to Sit;Sit to Supine     Supine to sit: HOB elevated;Supervision Sit to supine: Supervision;HOB elevated      Transfers Overall transfer level: Needs assistance Equipment used: Rolling walker (2 wheeled) Transfers: Sit to/from Stand Sit to Stand: Min guard         General transfer comment: verbal cues for UE/LE placement  Ambulation/Gait Ambulation/Gait assistance: Min guard Ambulation Distance (Feet): 120 Feet Assistive device: Rolling walker (2 wheeled) Gait Pattern/deviations: Step-through pattern;Antalgic Gait velocity: 13 sec for 10 meters   General Gait Details: verbal cues for RW positioning, posture   Stairs Stairs: Yes Stairs assistance: Min guard Stair Management: One rail Left;Step to pattern;Sideways;Forwards Number of Stairs: 2 General stair comments: verbal cues for sequence and safety  Wheelchair Mobility    Modified Rankin (Stroke Patients Only)       Balance                                    Cognition Arousal/Alertness: Awake/alert Behavior During Therapy: WFL for tasks assessed/performed Overall Cognitive Status: Within Functional Limits for tasks assessed                      Exercises Total Joint Exercises Heel Slides: AAROM;Right;5 reps    General Comments        Pertinent Vitals/Pain Pain Assessment: 0-10 Pain Score: 8  Pain Location: R knee with mobility  Pain Descriptors / Indicators: Aching;Sore Pain Intervention(s): Limited activity within patient's tolerance;Monitored during session;Repositioned;Patient requesting pain meds-RN notified    Home Living                      Prior Function            PT Goals (current goals can now be found in the care plan section) Progress towards PT goals: Progressing toward goals    Frequency  7X/week    PT Plan Current plan remains appropriate    Co-evaluation             End of Session   Activity Tolerance: Patient limited by pain Patient left: in bed;with call bell/phone within reach     Time: 2536-6440 PT Time Calculation (min) (ACUTE ONLY): 15 min  Charges:  $Gait Training: 8-22 mins                    G Codes:      Labresha Mellor,KATHrine  E 10/28/2015, 12:44 PM Zenovia Jarred, PT, DPT 10/28/2015 Pager: [phone]

## 2015-10-28 NOTE — Evaluation (Signed)
Occupational Therapy Evaluation Patient Details Name: Stuart Flores MRN: [id number] DOB: Jan 23, 1934 Today's Date: 10/28/2015    History of Present Illness Pt is a 82 year old male s/p R TKA   Clinical Impression   Pt was admitted for the above sx. All education was completed.  No further OT is needed at this time.    Follow Up Recommendations  Supervision/Assistance - 24 hour    Equipment Recommendations  3 in 1 bedside comode (if he doesn't have this; will have his wife check)    Recommendations for Other Services       Precautions / Restrictions Precautions Precautions: Fall;Knee Precaution Comments: Pt doing SLRs Restrictions Weight Bearing Restrictions: No      Mobility Bed Mobility         Supine to sit: HOB elevated;Min guard Sit to supine: Min guard   General bed mobility comments: crossed LLE under R to support back into bed  Transfers   Equipment used: Rolling walker (2 wheeled) Transfers: Sit to/from Stand Sit to Stand: Min guard         General transfer comment: cues for UE/LE placement    Balance                                            ADL Overall ADL's : Needs assistance/impaired     Grooming: Oral care;Supervision/safety;Standing       Lower Body Bathing: Minimal assistance;Sit to/from stand       Lower Body Dressing: Moderate assistance;Sit to/from stand   Toilet Transfer: Min guard;Ambulation;BSC             General ADL Comments: pt was unable to push up from 3:1 without use of arms; he has a toilet riser without arm rests.  Not sure if he has 3:1 commode.  Shower stall is Geologist, engineering      Pertinent Vitals/Pain Pain Assessment: 0-10 Pain Score: 4  Pain Location: R knee Pain Descriptors / Indicators: Aching Pain Intervention(s): Limited activity within patient's tolerance;Monitored during session;Premedicated before session;Repositioned;Ice applied      Hand Dominance     Extremity/Trunk Assessment Upper Extremity Assessment Upper Extremity Assessment: Generalized weakness           Communication Communication Communication: No difficulties   Cognition Arousal/Alertness: Awake/alert Behavior During Therapy: WFL for tasks assessed/performed Overall Cognitive Status: Within Functional Limits for tasks assessed                     General Comments       Exercises       Shoulder Instructions      Home Living Family/patient expects to be discharged to:: Private residence Living Arrangements: Spouse/significant other Available Help at Discharge: Family;Available 24 hours/day               Bathroom Shower/Tub: Walk-in shower (accessible with a seat)   Bathroom Toilet: Handicapped height     Home Equipment: Toilet riser (no rails; may have a BSC, not sure)          Prior Functioning/Environment Level of Independence: Independent             OT Diagnosis: Acute pain   OT Problem List:     OT Treatment/Interventions:      OT Goals(Current goals  can be found in the care plan section) Acute Rehab OT Goals OT Goal Formulation: All assessment and education complete, DC therapy  OT Frequency:     Barriers to D/C:            Co-evaluation              End of Session    Activity Tolerance: Patient tolerated treatment well Patient left: in bed;with call bell/phone within reach;with bed alarm set   Time: 7579-7282 OT Time Calculation (min): 30 min Charges:  OT General Charges $OT Visit: 1 Procedure OT Evaluation $OT Eval Low Complexity: 1 Procedure OT Treatments $Self Care/Home Management : 8-22 mins G-Codes:    Stuart Flores 2015-10-31, 8:40 AM

## 2015-10-28 NOTE — Progress Notes (Signed)
   Subjective: 1 Day Post-Op Procedure(s) (LRB): TOTAL RIGHT KNEE ARTHROPLASTY (Right) Patient reports pain as mild.   Patient seen in rounds with Dr. Lequita Halt.  Did great with PT yesterday.  Walked 70 feet day of surgery. Patient is well, and has had no acute complaints or problems We will resume therapy today.  If they do well with therapy and meets all goals, then will allow home later this afternoon following therapy. Plan is to go Home after hospital stay.  Objective: Vital signs in last 24 hours: Temp:  [95.9 F (35.5 C)-97.7 F (36.5 C)] 97.7 F (36.5 C) (07/25 0545) Pulse Rate:  [43-59] 46 (07/25 0545) Resp:  [11-18] 16 (07/25 0545) BP: (101-130)/(64-88) 101/67 (07/25 0545) SpO2:  [93 %-100 %] 93 % (07/25 0545) Weight:  [76.7 kg (169 lb)] 76.7 kg (169 lb) (07/24 1315)  Intake/Output from previous day:  Intake/Output Summary (Last 24 hours) at 10/28/15 0827 Last data filed at 10/28/15 0600  Gross per 24 hour  Intake          4586.67 ml  Output             2275 ml  Net          2311.67 ml    Intake/Output this shift: No intake/output data recorded.  Labs:  Recent Labs  10/28/15 0440  HGB 10.4*    Recent Labs  10/28/15 0440  WBC 13.1*  RBC 3.34*  HCT 30.7*  PLT 241    Recent Labs  10/28/15 0440  NA 139  K 4.6  CL 109  CO2 27  BUN 18  CREATININE 0.93  GLUCOSE 127*  CALCIUM 8.1*   No results for input(s): LABPT, INR in the last 72 hours.  EXAM General - Patient is Alert, Appropriate and Oriented Extremity - Neurovascular intact Sensation intact distally Dorsiflexion/Plantar flexion intact Dressing - dressing C/D/I Motor Function - intact, moving foot and toes well on exam.  Hemovac pulled without difficulty.  Past Medical History:  Diagnosis Date  . Arthritis    ostearthritis- back, knee  . Bradycardia   . Coronary artery disease   . Glaucoma    not a problem now  . Hyperlipemia   . Squamous carcinoma (HCC)      Assessment/Plan: 1 Day Post-Op Procedure(s) (LRB): TOTAL RIGHT KNEE ARTHROPLASTY (Right) Principal Problem:   OA (osteoarthritis) of knee  Estimated body mass index is 22.92 kg/m as calculated from the following:   Height as of this encounter: 6' (1.829 m).   Weight as of this encounter: 76.7 kg (169 lb). Advance diet Up with therapy Discharge home with home health later today if does well with PT  DVT Prophylaxis - Brilinta and Aspirin Weight-Bearing as tolerated to right leg D/C O2 and Pulse OX and try on Room Air  If meets goals and able to go home: Discharge home with home health Diet - Cardiac diet Follow up - in 2 weeks Activity - WBAT Disposition - Home Condition Upon Discharge - home if improved D/C Meds - See DC Summary DVT Prophylaxis - Brinilta and Aspirin  Avel Peace, PA-C Orthopaedic Surgery

## 2015-10-28 NOTE — Discharge Summary (Signed)
Physician Discharge Summary   Patient ID: Stuart Flores MRN: [id number] DOB/AGE: 04/12/1933 82 y.o.  Admit date: 10/27/2015 Discharge date: 10/29/2015  Primary Diagnosis:  Osteoarthritis  Right knee(s) Admission Diagnoses:  Past Medical History:  Diagnosis Date  . Arthritis    ostearthritis- back, knee  . Bradycardia   . Coronary artery disease   . Glaucoma    not a problem now  . Hyperlipemia   . Squamous carcinoma ([place])    Discharge Diagnoses:   Principal Problem:   OA (osteoarthritis) of knee  Estimated body mass index is 22.92 kg/m as calculated from the following:   Height as of this encounter: 6' (1.829 m).   Weight as of this encounter: 76.7 kg (169 lb).  Procedure:  Procedure(s) (LRB): TOTAL RIGHT KNEE ARTHROPLASTY (Right)   Consults: None  HPI: Stuart Flores is a 82 y.o. year old male with end stage OA of his right knee with progressively worsening pain and dysfunction. He has constant pain, with activity and at rest and significant functional deficits with difficulties even with ADLs. He has had extensive non-op management including analgesics, injections of cortisone and viscosupplements, and home exercise program, but remains in significant pain with significant dysfunction. Radiographs show bone on bone arthritis lateral. He presents now for right Total Knee Arthroplasty.   Laboratory Data: Admission on 10/27/2015  Component Date Value Ref Range Status  . ABO/RH(D) 10/27/2015 O POS   Final  . Antibody Screen 10/27/2015 NEG   Final  . Sample Expiration 10/27/2015 10/30/2015   Final  . ABO/RH(D) 10/27/2015 O POS   Final  . WBC 10/28/2015 13.1* 4.0 - 10.5 K/uL Final  . RBC 10/28/2015 3.34* 4.22 - 5.81 MIL/uL Final  . Hemoglobin 10/28/2015 10.4* 13.0 - 17.0 g/dL Final  . HCT 10/28/2015 30.7* 39.0 - 52.0 % Final  . MCV 10/28/2015 91.9  78.0 - 100.0 fL Final  . MCH 10/28/2015 31.1  26.0 - 34.0 pg Final  . MCHC 10/28/2015 33.9  30.0 - 36.0 g/dL Final  .  RDW 10/28/2015 14.7  11.5 - 15.5 % Final  . Platelets 10/28/2015 241  150 - 400 K/uL Final  . Sodium 10/28/2015 139  135 - 145 mmol/L Final  . Potassium 10/28/2015 4.6  3.5 - 5.1 mmol/L Final  . Chloride 10/28/2015 109  101 - 111 mmol/L Final  . CO2 10/28/2015 27  22 - 32 mmol/L Final  . Glucose, Bld 10/28/2015 127* 65 - 99 mg/dL Final  . BUN 10/28/2015 18  6 - 20 mg/dL Final  . Creatinine, Ser 10/28/2015 0.93  0.61 - 1.24 mg/dL Final  . Calcium 10/28/2015 8.1* 8.9 - 10.3 mg/dL Final  . GFR calc non Af Amer 10/28/2015 >60  >60 mL/min Final  . GFR calc Af Amer 10/28/2015 >60  >60 mL/min Final   Comment: (NOTE) The eGFR has been calculated using the CKD EPI equation. This calculation has not been validated in all clinical situations. eGFR's persistently <60 mL/min signify possible Chronic Kidney Disease.   Georgiann Hahn gap 10/28/2015 3* 5 - 15 Final  Hospital Outpatient Visit on 10/06/2015  Component Date Value Ref Range Status  . MRSA, PCR 10/06/2015 NEGATIVE  NEGATIVE Final  . Staphylococcus aureus 10/06/2015 NEGATIVE  NEGATIVE Final   Comment:        The Xpert SA Assay (FDA approved for NASAL specimens in patients over 60 years of age), is one component of a comprehensive surveillance program.  Test performance has been validated by  [facility] for patients greater than or equal to 3 year old. It is not intended to diagnose infection nor to guide or monitor treatment.   Marland Kitchen aPTT 10/06/2015 32  24 - 37 seconds Final  . WBC 10/06/2015 8.1  4.0 - 10.5 K/uL Final  . RBC 10/06/2015 4.20* 4.22 - 5.81 MIL/uL Final  . Hemoglobin 10/06/2015 12.9* 13.0 - 17.0 g/dL Final  . HCT 10/06/2015 38.4* 39.0 - 52.0 % Final  . MCV 10/06/2015 91.4  78.0 - 100.0 fL Final  . MCH 10/06/2015 30.7  26.0 - 34.0 pg Final  . MCHC 10/06/2015 33.6  30.0 - 36.0 g/dL Final  . RDW 10/06/2015 14.8  11.5 - 15.5 % Final  . Platelets 10/06/2015 282  150 - 400 K/uL Final  . Sodium 10/06/2015 137  135 - 145 mmol/L  Final  . Potassium 10/06/2015 4.8  3.5 - 5.1 mmol/L Final  . Chloride 10/06/2015 104  101 - 111 mmol/L Final  . CO2 10/06/2015 27  22 - 32 mmol/L Final  . Glucose, Bld 10/06/2015 91  65 - 99 mg/dL Final  . BUN 10/06/2015 21* 6 - 20 mg/dL Final  . Creatinine, Ser 10/06/2015 0.92  0.61 - 1.24 mg/dL Final  . Calcium 10/06/2015 9.0  8.9 - 10.3 mg/dL Final  . Total Protein 10/06/2015 6.6  6.5 - 8.1 g/dL Final  . Albumin 10/06/2015 4.1  3.5 - 5.0 g/dL Final  . AST 10/06/2015 34  15 - 41 U/L Final  . ALT 10/06/2015 24  17 - 63 U/L Final  . Alkaline Phosphatase 10/06/2015 47  38 - 126 U/L Final  . Total Bilirubin 10/06/2015 1.1  0.3 - 1.2 mg/dL Final  . GFR calc non Af Amer 10/06/2015 >60  >60 mL/min Final  . GFR calc Af Amer 10/06/2015 >60  >60 mL/min Final   Comment: (NOTE) The eGFR has been calculated using the CKD EPI equation. This calculation has not been validated in all clinical situations. eGFR's persistently <60 mL/min signify possible Chronic Kidney Disease.   . Anion gap 10/06/2015 6  5 - 15 Final  . Prothrombin Time 10/06/2015 14.1  11.6 - 15.2 seconds Final  . INR 10/06/2015 1.11  0.00 - 1.49 Final  . Color, Urine 10/06/2015 YELLOW  YELLOW Final  . APPearance 10/06/2015 CLOUDY* CLEAR Final  . Specific Gravity, Urine 10/06/2015 1.016  1.005 - 1.030 Final  . pH 10/06/2015 7.0  5.0 - 8.0 Final  . Glucose, UA 10/06/2015 NEGATIVE  NEGATIVE mg/dL Final  . Hgb urine dipstick 10/06/2015 NEGATIVE  NEGATIVE Final  . Bilirubin Urine 10/06/2015 NEGATIVE  NEGATIVE Final  . Ketones, ur 10/06/2015 NEGATIVE  NEGATIVE mg/dL Final  . Protein, ur 10/06/2015 NEGATIVE  NEGATIVE mg/dL Final  . Nitrite 10/06/2015 NEGATIVE  NEGATIVE Final  . Leukocytes, UA 10/06/2015 NEGATIVE  NEGATIVE Final     X-Rays:No results found.  EKG: Orders placed or performed during the hospital encounter of 03/04/15  . EKG 12-Lead  . EKG 12-Lead  . EKG 12-Lead immediately post procedure  . EKG 12-Lead  . EKG  12-Lead immediately post procedure  . EKG 12-Lead  . EKG 12-Lead  . EKG 12-Lead  . EKG 12-Lead  . EKG 12-Lead     Hospital Course: KENJI MAPEL is a 82 y.o. who was admitted to [facility]. They were brought to the operating room on 10/27/2015 and underwent Procedure(s): TOTAL RIGHT KNEE ARTHROPLASTY.  Patient tolerated the procedure well and was later transferred  to the recovery room and then to the orthopaedic floor for postoperative care.  They were given PO and IV analgesics for pain control following their surgery.  They were given 24 hours of postoperative antibiotics of  Anti-infectives    Start     Dose/Rate Route Frequency Ordered Stop   10/27/15 1500  ceFAZolin (ANCEF) IVPB 2g/100 mL premix     2 g 200 mL/hr over 30 Minutes Intravenous Every 6 hours 10/27/15 1337 10/27/15 2040   10/27/15 0630  ceFAZolin (ANCEF) IVPB 2g/100 mL premix     2 g 200 mL/hr over 30 Minutes Intravenous On call to O.R. 10/27/15 0630 10/27/15 0926     and started on DVT prophylaxis in the form of Brilinta and Aspirin.   PT and OT were ordered for total joint protocol.  Discharge planning consulted to help with postop disposition and equipment needs.  Patient had a long night on the evening of surgery with not much sleep.  They started to get up OOB with therapy on day one. Hemovac drain was pulled without difficulty.  Continued to work with therapy into day two. Unfortunately, had to have I&O cath several times thru the night and the foley was placed back.  Dressing was changed on day two and the incision was healing well.   Patient was seen in rounds on day two and from and ortho standpoint he was ready to go home.  He was instructed to follow up with the Urologist on an outpatient basis.  Discharge home with home health Diet - Cardiac diet Follow up - in 2 weeks at Dr. Anne Fu office and one with with Urology Activity - WBAT Disposition - Home Condition Upon Discharge - Good D/C Meds - See  DC Summary DVT Prophylaxis - Brilinta and Aspirin   Discharge Instructions    Call MD / Call 911    Complete by:  As directed   If you experience chest pain or shortness of breath, CALL 911 and be transported to the hospital emergency room.  If you develope a fever above 101 F, pus (white drainage) or increased drainage or redness at the wound, or calf pain, call your surgeon's office.   Change dressing    Complete by:  As directed   Change dressing daily with sterile 4 x 4 inch gauze dressing and apply TED hose. Do not submerge the incision under water.   Constipation Prevention    Complete by:  As directed   Drink plenty of fluids.  Prune juice may be helpful.  You may use a stool softener, such as Colace (over the counter) 100 mg twice a day.  Use MiraLax (over the counter) for constipation as needed.   Diet - low sodium heart healthy    Complete by:  As directed   Discharge instructions    Complete by:  As directed   Pick up stool softner and laxative for home use following surgery while on pain medications. Do not submerge incision under water. Please use good hand washing techniques while changing dressing each day. May shower starting three days after surgery. Please use a clean towel to pat the incision dry following showers. Continue to use ice for pain and swelling after surgery. Do not use any lotions or creams on the incision until instructed by your surgeon.   Postoperative Constipation Protocol  Constipation - defined medically as fewer than three stools per week and severe constipation as less than one stool per week.  One of  the most common issues patients have following surgery is constipation.  Even if you have a regular bowel pattern at home, your normal regimen is likely to be disrupted due to multiple reasons following surgery.  Combination of anesthesia, postoperative narcotics, change in appetite and fluid intake all can affect your bowels.  In order to avoid  complications following surgery, here are some recommendations in order to help you during your recovery period.  Colace (docusate) - Pick up an over-the-counter form of Colace or another stool softener and take twice a day as long as you are requiring postoperative pain medications.  Take with a full glass of water daily.  If you experience loose stools or diarrhea, hold the colace until you stool forms back up.  If your symptoms do not get better within 1 week or if they get worse, check with your doctor.  Dulcolax (bisacodyl) - Pick up over-the-counter and take as directed by the product packaging as needed to assist with the movement of your bowels.  Take with a full glass of water.  Use this product as needed if not relieved by Colace only.   MiraLax (polyethylene glycol) - Pick up over-the-counter to have on hand.  MiraLax is a solution that will increase the amount of water in your bowels to assist with bowel movements.  Take as directed and can mix with a glass of water, juice, soda, coffee, or tea.  Take if you go more than two days without a movement. Do not use MiraLax more than once per day. Call your doctor if you are still constipated or irregular after using this medication for 7 days in a row.  If you continue to have problems with postoperative constipation, please contact the office for further assistance and recommendations.  If you experience "the worst abdominal pain ever" or develop nausea or vomiting, please contact the office immediatly for further recommendations for treatment.  Resume the Brilinta and Aspirin at home following discharge.   Do not put a pillow under the knee. Place it under the heel.    Complete by:  As directed   Do not sit on low chairs, stoools or toilet seats, as it may be difficult to get up from low surfaces    Complete by:  As directed   Driving restrictions    Complete by:  As directed   No driving until released by the physician.   Increase activity  slowly as tolerated    Complete by:  As directed   Lifting restrictions    Complete by:  As directed   No lifting until released by the physician.   Patient may shower    Complete by:  As directed   You may shower without a dressing once there is no drainage.  Do not wash over the wound.  If drainage remains, do not shower until drainage stops.   TED hose    Complete by:  As directed   Use stockings (TED hose) for 3 weeks on both leg(s).  You may remove them at night for sleeping.   Weight bearing as tolerated    Complete by:  As directed   Laterality:  right   Extremity:  Lower       Medication List    TAKE these medications   aspirin EC 81 MG EC tablet Generic drug:  aspirin Take 81 mg by mouth every morning. Swallow whole.   atorvastatin 40 MG tablet Commonly known as:  LIPITOR Take 40 mg by  mouth every morning.   GLUCOSAMINE CHONDR COMPLEX PO Take 1 tablet by mouth daily.   methocarbamol 500 MG tablet Commonly known as:  ROBAXIN Take 1 tablet (500 mg total) by mouth every 6 (six) hours as needed for muscle spasms.   metoprolol succinate 25 MG 24 hr tablet Commonly known as:  TOPROL-XL Take 12.5 mg by mouth daily. @ Lunch Time   multivitamin capsule Take 1 capsule by mouth daily.   MYRBETRIQ 50 MG Tb24 tablet Generic drug:  mirabegron ER Take 50 mg by mouth daily.   nitroGLYCERIN 0.4 MG SL tablet Commonly known as:  NITROSTAT Place 0.4 mg under the tongue every 5 (five) minutes as needed for chest pain (No more than 3 doses).   oxyCODONE 5 MG immediate release tablet Commonly known as:  Oxy IR/ROXICODONE Take 1-2 tablets (5-10 mg total) by mouth every 3 (three) hours as needed for moderate pain or severe pain.   PROBIOTIC DAILY PO Take 1 capsule by mouth daily.   ticagrelor 90 MG Tabs tablet Commonly known as:  BRILINTA Take 1 tablet (90 mg total) by mouth 2 (two) times daily.   traMADol 50 MG tablet Commonly known as:  ULTRAM Take 1-2 tablets (50-100 mg  total) by mouth every 6 (six) hours as needed (mild pain).      Follow-up Information    Gearlean Alf, MD. Schedule an appointment as soon as possible for a visit on 11/11/2015.   Specialty:  Orthopedic Surgery Why:  Call office for appointment on Tuesday 8/8/82017 with Dr. Wynelle Link. Contact information: [address] [place] [address] [phone]           Signed: Arlee Muslim, PA-C Orthopaedic Surgery 10/28/2015, 8:50 AM

## 2015-10-29 LAB — BASIC METABOLIC PANEL
ANION GAP: 4 — AB (ref 5–15)
BUN: 18 mg/dL (ref 6–20)
CALCIUM: 8.3 mg/dL — AB (ref 8.9–10.3)
CO2: 30 mmol/L (ref 22–32)
Chloride: 103 mmol/L (ref 101–111)
Creatinine, Ser: 0.97 mg/dL (ref 0.61–1.24)
GFR calc non Af Amer: >60 mL/min (ref >60)
Glucose, Bld: 107 mg/dL — ABNORMAL HIGH (ref 65–99)
Potassium: 4.6 mmol/L (ref 3.5–5.1)
Sodium: 137 mmol/L (ref 135–145)

## 2015-10-29 LAB — CBC
HEMATOCRIT: 30.6 % — AB (ref 39.0–52.0)
HEMOGLOBIN: 10.3 g/dL — AB (ref 13.0–17.0)
MCH: 31.1 pg (ref 26.0–34.0)
MCHC: 33.7 g/dL (ref 30.0–36.0)
MCV: 92.4 fL (ref 78.0–100.0)
Platelets: 263 10*3/uL (ref 150–400)
RBC: 3.31 MIL/uL — AB (ref 4.22–5.81)
RDW: 15 % (ref 11.5–15.5)
WBC: 13.1 10*3/uL — AB (ref 4.0–10.5)

## 2015-10-29 NOTE — Progress Notes (Signed)
Physical Therapy Treatment Patient Details Name: Stuart Flores MRN: [id number] DOB: 1934-03-06 Today's Date: 10/29/2015    History of Present Illness Pt is a 82 year old male s/p R TKA    PT Comments    Pt reports increased pain and stiffness today.  Pt able to mobilize and ambulated 100 feet however pain 8/10 R knee.  Pt repositioned in recliner and applied ice packs.  Pt did not wish to perform exercises due to pain so encouraged to perform once settled at home.  Spouse wished to review and discuss exercise HEP, so spouse educated on all exercises to assist pt at home.   Follow Up Recommendations  Supervision for mobility/OOB;Home health PT     Equipment Recommendations  None recommended by PT    Recommendations for Other Services       Precautions / Restrictions Precautions Precautions: Fall;Knee Restrictions Other Position/Activity Restrictions: WBAT    Mobility  Bed Mobility Overal bed mobility: Needs Assistance Bed Mobility: Supine to Sit     Supine to sit: Min assist     General bed mobility comments: required assist for R LE today due to pain/inability to lift - spouse present and educated  Transfers Overall transfer level: Needs assistance Equipment used: Rolling walker (2 wheeled) Transfers: Sit to/from Stand Sit to Stand: Min guard         General transfer comment: increased time and effort  Ambulation/Gait Ambulation/Gait assistance: Min guard Ambulation Distance (Feet): 100 Feet Assistive device: Rolling walker (2 wheeled) Gait Pattern/deviations: Step-to pattern;Decreased stance time - right;Antalgic     General Gait Details: verbal cues for RW positioning, posture, encouraged increased WBing through RW for pain control   Stairs Stairs:  (pt did not feel he needed to practice again, able to verbalize sequence)          Wheelchair Mobility    Modified Rankin (Stroke Patients Only)       Balance                                    Cognition Arousal/Alertness: Awake/alert Behavior During Therapy: WFL for tasks assessed/performed Overall Cognitive Status: Within Functional Limits for tasks assessed                      Exercises      General Comments        Pertinent Vitals/Pain Pain Assessment: 0-10 Pain Score: 7  Pain Location: R knee during ambulation Pain Descriptors / Indicators: Aching;Sore Pain Intervention(s): Limited activity within patient's tolerance;Monitored during session;Premedicated before session;Repositioned;Ice applied    Home Living                      Prior Function            PT Goals (current goals can now be found in the care plan section) Progress towards PT goals: Progressing toward goals    Frequency  7X/week    PT Plan Current plan remains appropriate    Co-evaluation             End of Session   Activity Tolerance: Patient limited by pain Patient left: in chair;with call bell/phone within reach;with family/visitor present     Time: 6979-4801 PT Time Calculation (min) (ACUTE ONLY): 32 min  Charges:  $Gait Training: 8-22 mins $Therapeutic Exercise: 8-22 mins  G Codes:      Hyman Crossan,KATHrine E 11-19-15, 1:03 PM Zenovia Jarred, PT, DPT 11/19/15 Pager: [phone]

## 2015-10-29 NOTE — Progress Notes (Signed)
   Subjective: 2 Days Post-Op Procedure(s) (LRB): TOTAL RIGHT KNEE ARTHROPLASTY (Right) Patient reports pain as mild.   Patient seen in rounds with Dr. Lequita Halt. Had to have I&O twice yesterday and then left foley in. Will convert to leg bag and allow home today.  Will have him follow up with [facility] next week. Patient is well, and has had no acute complaints or problems Patient is ready to go home later today following morning therapy.  Objective: Vital signs in last 24 hours: Temp:  [97.9 F (36.6 C)-98.3 F (36.8 C)] 98 F (36.7 C) (07/26 0630) Pulse Rate:  [42-66] 66 (07/26 0630) Resp:  [14-20] 20 (07/26 0630) BP: (95-124)/(59-68) 124/68 (07/26 0630) SpO2:  [94 %-99 %] 99 % (07/26 0630)  Intake/Output from previous day:  Intake/Output Summary (Last 24 hours) at 10/29/15 0715 Last data filed at 10/29/15 0630  Gross per 24 hour  Intake             1150 ml  Output             1175 ml  Net              -25 ml    Intake/Output this shift: No intake/output data recorded.  Labs:  Recent Labs  10/28/15 0440 10/29/15 0413  HGB 10.4* 10.3*    Recent Labs  10/28/15 0440 10/29/15 0413  WBC 13.1* 13.1*  RBC 3.34* 3.31*  HCT 30.7* 30.6*  PLT 241 263    Recent Labs  10/28/15 0440 10/29/15 0413  NA 139 137  K 4.6 4.6  CL 109 103  CO2 27 30  BUN 18 18  CREATININE 0.93 0.97  GLUCOSE 127* 107*  CALCIUM 8.1* 8.3*   No results for input(s): LABPT, INR in the last 72 hours.  EXAM: General - Patient is Alert, Appropriate and Oriented Extremity - Neurovascular intact Sensation intact distally Incision - clean, dry, no drainage Motor Function - intact, moving foot and toes well on exam.   Assessment/Plan: 2 Days Post-Op Procedure(s) (LRB): TOTAL RIGHT KNEE ARTHROPLASTY (Right) Procedure(s) (LRB): TOTAL RIGHT KNEE ARTHROPLASTY (Right) Past Medical History:  Diagnosis Date  . Arthritis    ostearthritis- back, knee  . Bradycardia   . Coronary  artery disease   . Glaucoma    not a problem now  . Hyperlipemia   . Squamous carcinoma ([facility])    Principal Problem:   OA (osteoarthritis) of knee  Estimated body mass index is 22.92 kg/m as calculated from the following:   Height as of this encounter: 6' (1.829 m).   Weight as of this encounter: 76.7 kg (169 lb). Up with therapy Discharge home with home health Diet - Cardiac diet Follow up - in 2 weeks at Dr. Deri Fuelling office and one with with Urology Activity - WBAT Disposition - Home Condition Upon Discharge - Good D/C Meds - See DC Summary DVT Prophylaxis - Brilinta and Aspirin  Avel Peace, PA-C Orthopaedic Surgery 10/29/2015, 7:15 AM

## 2016-02-23 ENCOUNTER — Inpatient Hospital Stay: Admit: 2016-02-23 | Payer: Medicare Other | Admitting: Orthopedic Surgery

## 2016-02-23 SURGERY — ARTHROPLASTY, KNEE, TOTAL
Anesthesia: Choice | Site: Knee | Laterality: Left

## 2016-12-09 ENCOUNTER — Other Ambulatory Visit (HOSPITAL_COMMUNITY): Payer: Self-pay | Admitting: Internal Medicine

## 2016-12-09 ENCOUNTER — Encounter (HOSPITAL_COMMUNITY): Payer: Self-pay | Admitting: Nurse Practitioner

## 2016-12-09 ENCOUNTER — Ambulatory Visit (HOSPITAL_COMMUNITY): Payer: Self-pay | Admitting: Nurse Practitioner

## 2016-12-09 ENCOUNTER — Ambulatory Visit (HOSPITAL_COMMUNITY)
Admission: RE | Admit: 2016-12-09 | Discharge: 2016-12-09 | Disposition: A | Discharge status: Home / Self Care | Payer: Medicare Other | Source: Ambulatory Visit | Attending: Nurse Practitioner | Admitting: Nurse Practitioner

## 2016-12-09 VITALS — BP 94/62 | HR 71 | Ht 72.0 in | Wt 168.0 lb

## 2016-12-09 DIAGNOSIS — Z9841 Cataract extraction status, right eye: Secondary | ICD-10-CM | POA: Insufficient documentation

## 2016-12-09 DIAGNOSIS — Z7902 Long term (current) use of antithrombotics/antiplatelets: Secondary | ICD-10-CM | POA: Insufficient documentation

## 2016-12-09 DIAGNOSIS — I2510 Atherosclerotic heart disease of native coronary artery without angina pectoris: Secondary | ICD-10-CM | POA: Diagnosis not present

## 2016-12-09 DIAGNOSIS — M1990 Unspecified osteoarthritis, unspecified site: Secondary | ICD-10-CM | POA: Diagnosis not present

## 2016-12-09 DIAGNOSIS — Z85828 Personal history of other malignant neoplasm of skin: Secondary | ICD-10-CM | POA: Diagnosis not present

## 2016-12-09 DIAGNOSIS — R600 Localized edema: Secondary | ICD-10-CM | POA: Diagnosis not present

## 2016-12-09 DIAGNOSIS — I480 Paroxysmal atrial fibrillation: Secondary | ICD-10-CM | POA: Insufficient documentation

## 2016-12-09 DIAGNOSIS — Z87891 Personal history of nicotine dependence: Secondary | ICD-10-CM | POA: Diagnosis not present

## 2016-12-09 DIAGNOSIS — Z9842 Cataract extraction status, left eye: Secondary | ICD-10-CM | POA: Insufficient documentation

## 2016-12-09 DIAGNOSIS — J069 Acute upper respiratory infection, unspecified: Secondary | ICD-10-CM | POA: Diagnosis not present

## 2016-12-09 DIAGNOSIS — H409 Unspecified glaucoma: Secondary | ICD-10-CM | POA: Insufficient documentation

## 2016-12-09 DIAGNOSIS — Z955 Presence of coronary angioplasty implant and graft: Secondary | ICD-10-CM | POA: Insufficient documentation

## 2016-12-09 DIAGNOSIS — Z9889 Other specified postprocedural states: Secondary | ICD-10-CM | POA: Insufficient documentation

## 2016-12-09 DIAGNOSIS — Z96651 Presence of right artificial knee joint: Secondary | ICD-10-CM | POA: Insufficient documentation

## 2016-12-09 DIAGNOSIS — E785 Hyperlipidemia, unspecified: Secondary | ICD-10-CM | POA: Diagnosis not present

## 2016-12-09 MED ORDER — APIXABAN 5 MG PO TABS: 5.0000 mg | ORAL_TABLET | Freq: Two times a day (BID) | ORAL | 0 refills | Status: DC

## 2016-12-09 NOTE — Patient Instructions (Signed)
Your physician has recommended you make the following change in your medication:  1)Stop Aspirin 2)Start Eliquis 5mg  twice a day   Follow up with Dr. Jacinto HalimGanji as scheduled.

## 2016-12-09 NOTE — Progress Notes (Signed)
Primary Care Physician: Chilton Greathouse, MD Referring Physician: Ronney Lion, Np   Stuart Flores is a 83 y.o. male with a h/o CAD, receiving 2 stents in 2016, whom saw his PCP today for cough and LLE for 2 weeks. He had traveled to [place] mid to late August and noted cough and swelling since that time.Ekg in PCP office showed new onset agib with rvr at 115 bpm. He is currently on 12.5 mg metoprolol succinate for CAD. He is alos on a baby asa daily. He has a chadsvasc score of 3 for age and CAD. I do not have prior records of most recent stress tests or echo. HE does have an appointment with Dr. Jacinto Halim 9/19.   In the afib clinic, repeat EKG shows NSR at 71 bpm. PCP is also treating pt for pneumonia and he has not yet picked up antibiotic as of yet. He does drink a glass of wine every night. No excessive caffeine or tobacco products.He also saw a dermatologist for LLE , rash and itching and was given knee high support hose.  Today, he denies symptoms of palpitations, chest pain, shortness of breath, orthopnea, PND, lower extremity edema, dizziness, presyncope, syncope, or neurologic sequela. The patient is tolerating medications without difficulties and is otherwise without complaint today.   Past Medical History:  Diagnosis Date  . Arthritis    ostearthritis- back, knee  . Bradycardia   . Coronary artery disease   . Glaucoma    not a problem now  . Hyperlipemia   . Squamous carcinoma    Past Surgical History:  Procedure Laterality Date  . CARDIAC CATHETERIZATION N/A 03/04/2015   Procedure: Left Heart Cath and Coronary Angiography;  Surgeon: Yates Decamp, MD;  Location: [facility] INVASIVE CV LAB;  Service: Cardiovascular;  Laterality: N/A;  . CARDIAC CATHETERIZATION  03/04/2015   Procedure: Coronary Stent Intervention;  Surgeon: Yates Decamp, MD;  Location: [facility] INVASIVE CV LAB;  Service: Cardiovascular;;  . CATARACT EXTRACTION W/ INTRAOCULAR LENS  IMPLANT, BILATERAL Bilateral 2015  .  CORONARY ANGIOPLASTY WITH STENT PLACEMENT  03/03/2015   "2 stents"  . HAND SURGERY Left    "thumb surgery for Tendon pair"  . KNEE ARTHROSCOPY Bilateral   . SQUAMOUS CELL CARCINOMA EXCISION  X 2  . TONSILLECTOMY    . TOTAL KNEE ARTHROPLASTY Right 10/27/2015   Procedure: TOTAL RIGHT KNEE ARTHROPLASTY;  Surgeon: Ollen Gross, MD;  Location: [facility] ORS;  Service: Orthopedics;  Laterality: Right;    Current Outpatient Prescriptions  Medication Sig Dispense Refill  . atorvastatin (LIPITOR) 40 MG tablet Take 40 mg by mouth every morning.     . metoprolol succinate (TOPROL-XL) 25 MG 24 hr tablet Take 12.5 mg by mouth daily. @ Lunch Time    . Multiple Vitamin (MULTIVITAMIN) capsule Take 1 capsule by mouth daily.    . Probiotic Product (PROBIOTIC DAILY PO) Take 1 capsule by mouth daily.    Marland Kitchen apixaban (ELIQUIS) 5 MG TABS tablet Take 1 tablet (5 mg total) by mouth 2 (two) times daily. 60 tablet 0  . Glucosamine-Chondroitin (GLUCOSAMINE CHONDR COMPLEX PO) Take 1 tablet by mouth daily.     . nitroGLYCERIN (NITROSTAT) 0.4 MG SL tablet Place 0.4 mg under the tongue every 5 (five) minutes as needed for chest pain (No more than 3 doses).      No current facility-administered medications for this encounter.     No Known Allergies  Social History   Social History  . Marital status: Married  Spouse name: N/A  . Number of children: N/A  . Years of education: N/A   Occupational History  . Not on file.   Social History Main Topics  . Smoking status: Former Smoker    Packs/day: 1.00    Years: 9.00    Types: Cigarettes  . Smokeless tobacco: Never Used     Comment: stopped smoking cigarettes at age 83  . Alcohol use 5.4 oz/week    9 Glasses of wine per week  . Drug use: Unknown  . Sexual activity: No   Other Topics Concern  . Not on file   Social History Narrative  . No narrative on file    No family history on file.  ROS- All systems are reviewed and negative except as per the HPI  above  Physical Exam: Vitals:   12/09/16 1539  BP: 94/62  Pulse: 71  Weight: 168 lb (76.2 kg)  Height: 6' (1.829 m)   Wt Readings from Last 3 Encounters:  12/09/16 168 lb (76.2 kg)  10/27/15 169 lb (76.7 kg)  10/06/15 169 lb (76.7 kg)    Labs: Lab Results  Component Value Date   NA 137 10/29/2015   K 4.6 10/29/2015   CL 103 10/29/2015   CO2 30 10/29/2015   GLUCOSE 107 (H) 10/29/2015   BUN 18 10/29/2015   CREATININE 0.97 10/29/2015   CALCIUM 8.3 (L) 10/29/2015   Lab Results  Component Value Date   INR 1.11 10/06/2015   No results found for: CHOL, HDL, LDLCALC, TRIG   GEN- The patient is well appearing, alert and oriented x 3 today.   Head- normocephalic, atraumatic Eyes-  Sclera clear, conjunctiva pink Ears- hearing intact Oropharynx- clear Neck- supple, no JVP Lymph- no cervical lymphadenopathy Lungs- Clear to ausculation bilaterally, normal work of breathing Heart- Regular rate and rhythm, no murmurs, rubs or gallops, PMI not laterally displaced GI- soft, NT, ND, + BS Extremities- no clubbing, cyanosis, or edema MS- no significant deformity or atrophy Skin- no rash or lesion Psych- euthymic mood, full affect Neuro- strength and sensation are intact  EKG-Reviewed from PCP shows afib at 115 bpm. EKG shows SR at 71 bpm. Labs reviewed form PCP drawn today CXR reviewed, WBC- 11.17 cbc-hgb 11.9  Hct,35.2, creatinine 0.9 CXR- Minimal left basilar atelectasis or infiltrate, COPD changes    Assessment and Plan: 1. New onset paroxysmal afib Now back in SR Would probably benefit from a 30 day event monitor to see overall burden Continue BB, cannot increase dose for soft BP at 94/60 Encouraged to reduce alcohol intake to 2 glasses a week IF not recently done, would also benefit from an echo and stress test Possibly induced form URI  2. Chadsvasc score of 3 Will stop ASA and let Dr. Jacinto HalimGanji know in case he wants to continue it with h/o CAD, stents are now 81 years  old, but would make pt higher bleeding risk States he does not have bleeding history Bleeding precautions reviewed Anticoagulations choices discussed and he would like to use eliquis as his wife is on it Will start 5mg  bid 30 day free coupon given  3. LLE May be 2/2 afib burden Wearing compression socks Will not start diuretic 2/2 soft BP Avoid salt Pending LE dopplers per PCP  4. URI  Per PCP Has rx for levaquin 500 mg daily x 7 days  Pt will f/u with Dr. Jacinto HalimGanji on 9/19 as already scheduled as he will need cardiac w/u as described above  Lupita Leashonna  Zara Council, [facility] [address] [place], [place] [address] [phone]

## 2016-12-10 ENCOUNTER — Ambulatory Visit (HOSPITAL_COMMUNITY)
Admission: RE | Admit: 2016-12-10 | Discharge: 2016-12-10 | Disposition: A | Discharge status: Home / Self Care | Payer: Medicare Other | Source: Ambulatory Visit | Attending: Vascular Surgery | Admitting: Vascular Surgery

## 2016-12-10 DIAGNOSIS — R600 Localized edema: Secondary | ICD-10-CM

## 2017-11-09 NOTE — H&P (Signed)
Stuart Flores is an 84 y.o. male.    Chief Complaint:     Right total knee with painful scar   Procedure:   1. Right knee open arthrotomy with scar debridement and possible poly exchange  2. Left knee cortisone injection  HPI: Pt is a 84 y.o. male complaining of right knee pain for < 2 years. Pain had continually increased since the beginning.  Hx of a right TKA in 2017 per Dr. Lequita Halt.   X-rays in the clinic show previous right TKA. Pt has tried various conservative treatments which have failed to alleviate their symptoms. Various options are discussed with the patient. Risks, benefits and expectations were discussed with the patient. Patient understand the risks, benefits and expectations and wishes to proceed with surgery.   PCP: Chilton Greathouse, MD  D/C Plans:       Home  Post-op Meds:       No Rx given  Tranexamic Acid:      To be given - IV   Decadron:      Is to be given  FYI:     Eliquis (on pre-op)  Norco 5  Flomax (possibly - had issues with urination after the last surgery)  DME:   Pt already has equipment  PT:   OPPT    PMH: Past Medical History:  Diagnosis Date  . Arthritis    ostearthritis- back, knee  . Bradycardia   . Coronary artery disease   . Glaucoma    not a problem now  . Hyperlipemia   . Squamous carcinoma     PSH: Past Surgical History:  Procedure Laterality Date  . CARDIAC CATHETERIZATION N/A 03/04/2015   Procedure: Left Heart Cath and Coronary Angiography;  Surgeon: Yates Decamp, MD;  Location: [facility] INVASIVE CV LAB;  Service: Cardiovascular;  Laterality: N/A;  . CARDIAC CATHETERIZATION  03/04/2015   Procedure: Coronary Stent Intervention;  Surgeon: Yates Decamp, MD;  Location: [facility] INVASIVE CV LAB;  Service: Cardiovascular;;  . CATARACT EXTRACTION W/ INTRAOCULAR LENS  IMPLANT, BILATERAL Bilateral 2015  . CORONARY ANGIOPLASTY WITH STENT PLACEMENT  03/03/2015   "2 stents"  . HAND SURGERY Left    "thumb surgery for Tendon pair"  . KNEE  ARTHROSCOPY Bilateral   . SQUAMOUS CELL CARCINOMA EXCISION  X 2  . TONSILLECTOMY    . TOTAL KNEE ARTHROPLASTY Right 10/27/2015   Procedure: TOTAL RIGHT KNEE ARTHROPLASTY;  Surgeon: Ollen Gross, MD;  Location: [facility];  Service: Orthopedics;  Laterality: Right;    Social History:  reports that he has quit smoking. His smoking use included cigarettes. He has a 9.00 pack-year smoking history. He has never used smokeless tobacco. He reports that he drinks about 5.4 oz of alcohol per week. His drug history is not on file.  Allergies:  No Known Allergies  Medications: No current facility-administered medications for this encounter.    Current Outpatient Medications  Medication Sig Dispense Refill  . apixaban (ELIQUIS) 5 MG TABS tablet Take 1 tablet (5 mg total) by mouth 2 (two) times daily. 60 tablet 0  . atorvastatin (LIPITOR) 40 MG tablet Take 40 mg by mouth every morning.     . Glucosamine-Chondroitin (GLUCOSAMINE CHONDR COMPLEX PO) Take 1 tablet by mouth daily.     . metoprolol succinate (TOPROL-XL) 25 MG 24 hr tablet Take 12.5 mg by mouth daily. @ Lunch Time    . Multiple Vitamin (MULTIVITAMIN) capsule Take 1 capsule by mouth daily.    . nitroGLYCERIN (  NITROSTAT) 0.4 MG SL tablet Place 0.4 mg under the tongue every 5 (five) minutes as needed for chest pain (No more than 3 doses).     . Probiotic Product (PROBIOTIC DAILY PO) Take 1 capsule by mouth daily.       Review of Systems  Constitutional: Negative.   HENT: Negative.   Eyes: Negative.   Respiratory: Negative.   Cardiovascular: Negative.   Gastrointestinal: Negative.   Genitourinary: Positive for frequency.  Musculoskeletal: Positive for joint pain.  Skin: Negative.   Neurological: Negative.   Endo/Heme/Allergies: Negative.   Psychiatric/Behavioral: Negative.        Physical Exam  Constitutional: He is oriented to person, place, and time. He appears well-developed.  HENT:  Head: Normocephalic.  Eyes: Pupils are  equal, round, and reactive to light.  Neck: Neck supple. No JVD present. No tracheal deviation present. No thyromegaly present.  Cardiovascular: Normal rate, regular rhythm and intact distal pulses.  Respiratory: Effort normal and breath sounds normal. No respiratory distress. He has no wheezes.  GI: Soft. There is no tenderness. There is no guarding.  Musculoskeletal:       Right knee: He exhibits decreased range of motion, swelling and bony tenderness. He exhibits no ecchymosis, no deformity, no laceration and no erythema. Tenderness found.  Lymphadenopathy:    He has no cervical adenopathy.  Neurological: He is alert and oriented to person, place, and time.  Skin: Skin is warm and dry.  Psychiatric: He has a normal mood and affect.       Assessment/Plan Assessment:     Right total knee with painful scar   Plan: Patient will undergo a right knee open arthrotomy with scar debridement and possible poly exchange and a left knee cortisone injection on 11/24/2017 per Dr. Charlann Boxerlin [facility]. Risks benefits and expectations were discussed with the patient. Patient understand risks, benefits and expectations and wishes to proceed.   Anastasio AuerbachMatthew S. Nohea Kras   PA-C  11/09/2017, 1:22 PM

## 2017-11-14 NOTE — Progress Notes (Signed)
12-19-16 (Epic) EKG  10-10-17 Cardiology Clearance from Dr. Yates DecampJay Ganji on chart  10-04-17 Surgical Clearance from Dr. Felipa EthAvva on chart

## 2017-11-14 NOTE — Patient Instructions (Signed)
Stuart Flores  11/14/2017   Your procedure is scheduled on: 11-24-17   Report to [facility] Main  Entrance    Report to Admitting at 5:30 AM    Call this number if you have problems the morning of surgery [phone]   Remember: Do not eat food or drink liquids :After Midnight.     Take these medicines the morning of surgery with A SIP OF WATER: Metoprolol Succinate (Toprol-XL) and Atorvastatin (Lipitor)                                You may not have any metal on your body including hair pins and              piercings  Do not wear jewelry, lotions, powders,  cologne or deodorant              Men may shave face and neck.   Do not bring valuables to the hospital. [facility] IS NOT             RESPONSIBLE   FOR VALUABLES.  Contacts, dentures or bridgework may not be worn into surgery.  Leave suitcase in the car. After surgery it may be brought to your room.       Special Instructions: N/A              Please read over the following fact sheets you were given: _____________________________________________________________________             [facility] - Preparing for [facility] Before surgery, you can play an important role.  Because skin is not sterile, your skin needs to be as free of germs as possible.  You can reduce the number of germs on your skin by washing with CHG (chlorahexidine gluconate) soap before surgery.  CHG is an antiseptic cleaner which kills germs and bonds with the skin to continue killing germs even after washing. Please DO NOT use if you have an allergy to CHG or antibacterial soaps.  If your skin becomes reddened/irritated stop using the CHG and inform your nurse when you arrive at Short Stay. Do not shave (including legs and underarms) for at least 48 hours prior to the first CHG shower.  You may shave your face/neck. Please follow these instructions carefully:  1.  Shower with CHG Soap the night before surgery and the  morning  of Surgery.  2.  If you choose to wash your hair, wash your hair first as usual with your  normal  shampoo.  3.  After you shampoo, rinse your hair and body thoroughly to remove the  shampoo.                           4.  Use CHG as you would any other liquid soap.  You can apply chg directly  to the skin and wash                       Gently with a scrungie or clean washcloth.  5.  Apply the CHG Soap to your body ONLY FROM THE NECK DOWN.   Do not use on face/ open  Wound or open sores. Avoid contact with eyes, ears mouth and genitals (private parts).                       Wash face,  Genitals (private parts) with your normal soap.             6.  Wash thoroughly, paying special attention to the area where your surgery  will be performed.  7.  Thoroughly rinse your body with warm water from the neck down.  8.  DO NOT shower/wash with your normal soap after using and rinsing off  the CHG Soap.                9.  Pat yourself dry with a clean towel.            10.  Wear clean pajamas.            11.  Place clean sheets on your bed the night of your first shower and do not  sleep with pets. Day of Surgery : Do not apply any lotions/deodorants the morning of surgery.  Please wear clean clothes to the hospital/surgery center.  FAILURE TO FOLLOW THESE INSTRUCTIONS MAY RESULT IN THE CANCELLATION OF YOUR SURGERY PATIENT SIGNATURE_________________________________  NURSE SIGNATURE__________________________________  ________________________________________________________________________   Adam Phenix  An incentive spirometer is a tool that can help keep your lungs clear and active. This tool measures how well you are filling your lungs with each breath. Taking long deep breaths may help reverse or decrease the chance of developing breathing (pulmonary) problems (especially infection) following:  A long period of time when you are unable to move or be  active. BEFORE THE PROCEDURE   If the spirometer includes an indicator to show your best effort, your nurse or respiratory therapist will set it to a desired goal.  If possible, sit up straight or lean slightly forward. Try not to slouch.  Hold the incentive spirometer in an upright position. INSTRUCTIONS FOR USE  1. Sit on the edge of your bed if possible, or sit up as far as you can in bed or on a chair. 2. Hold the incentive spirometer in an upright position. 3. Breathe out normally. 4. Place the mouthpiece in your mouth and seal your lips tightly around it. 5. Breathe in slowly and as deeply as possible, raising the piston or the ball toward the top of the column. 6. Hold your breath for 3-5 seconds or for as long as possible. Allow the piston or ball to fall to the bottom of the column. 7. Remove the mouthpiece from your mouth and breathe out normally. 8. Rest for a few seconds and repeat Steps 1 through 7 at least 10 times every 1-2 hours when you are awake. Take your time and take a few normal breaths between deep breaths. 9. The spirometer may include an indicator to show your best effort. Use the indicator as a goal to work toward during each repetition. 10. After each set of 10 deep breaths, practice coughing to be sure your lungs are clear. If you have an incision (the cut made at the time of surgery), support your incision when coughing by placing a pillow or rolled up towels firmly against it. Once you are able to get out of bed, walk around indoors and cough well. You may stop using the incentive spirometer when instructed by your caregiver.  RISKS AND COMPLICATIONS  Take your time so you do not get  dizzy or light-headed.  If you are in pain, you may need to take or ask for pain medication before doing incentive spirometry. It is harder to take a deep breath if you are having pain. AFTER USE  Rest and breathe slowly and easily.  It can be helpful to keep track of a log of  your progress. Your caregiver can provide you with a simple table to help with this. If you are using the spirometer at home, follow these instructions: [place] IF:   You are having difficultly using the spirometer.  You have trouble using the spirometer as often as instructed.  Your pain medication is not giving enough relief while using the spirometer.  You develop fever of 100.5 F (38.1 C) or higher. SEEK IMMEDIATE MEDICAL CARE IF:   You cough up bloody sputum that had not been present before.  You develop fever of 102 F (38.9 C) or greater.  You develop worsening pain at or near the incision site. MAKE SURE YOU:   Understand these instructions.  Will watch your condition.  Will get help right away if you are not doing well or get worse. Document Released: 08/02/2006 Document Revised: 06/14/2011 Document Reviewed: 10/03/2006 Exit[employer] Patient Information 2014 Exit[employer], [place].   ________________________________________________________________________  WHAT IS A BLOOD TRANSFUSION? Blood Transfusion Information  A transfusion is the replacement of blood or some of its parts. Blood is made up of multiple cells which provide different functions.  Red blood cells carry oxygen and are used for blood loss replacement.  White blood cells fight against infection.  Platelets control bleeding.  Plasma helps clot blood.  Other blood products are available for specialized needs, such as hemophilia or other clotting disorders. BEFORE THE TRANSFUSION  Who gives blood for transfusions?   Healthy volunteers who are fully evaluated to make sure their blood is safe. This is blood bank blood. Transfusion therapy is the safest it has ever been in the practice of medicine. Before blood is taken from a donor, a complete history is taken to make sure that person has no history of diseases nor engages in risky social behavior (examples are intravenous drug use or sexual activity  with multiple partners). The donor's travel history is screened to minimize risk of transmitting infections, such as malaria. The donated blood is tested for signs of infectious diseases, such as HIV and hepatitis. The blood is then tested to be sure it is compatible with you in order to minimize the chance of a transfusion reaction. If you or a relative donates blood, this is often done in anticipation of surgery and is not appropriate for emergency situations. It takes many days to process the donated blood. RISKS AND COMPLICATIONS Although transfusion therapy is very safe and saves many lives, the main dangers of transfusion include:   Getting an infectious disease.  Developing a transfusion reaction. This is an allergic reaction to something in the blood you were given. Every precaution is taken to prevent this. The decision to have a blood transfusion has been considered carefully by your caregiver before blood is given. Blood is not given unless the benefits outweigh the risks. AFTER THE TRANSFUSION  Right after receiving a blood transfusion, you will usually feel much better and more energetic. This is especially true if your red blood cells have gotten low (anemic). The transfusion raises the level of the red blood cells which carry oxygen, and this usually causes an energy increase.  The nurse administering the transfusion will  monitor you carefully for complications. HOME CARE INSTRUCTIONS  No special instructions are needed after a transfusion. You may find your energy is better. Speak with your caregiver about any limitations on activity for underlying diseases you may have. SEEK MEDICAL CARE IF:   Your condition is not improving after your transfusion.  You develop redness or irritation at the intravenous (IV) site. SEEK IMMEDIATE MEDICAL CARE IF:  Any of the following symptoms occur over the next 12 hours:  Shaking chills.  You have a temperature by mouth above 102 F (38.9  C), not controlled by medicine.  Chest, back, or muscle pain.  People around you feel you are not acting correctly or are confused.  Shortness of breath or difficulty breathing.  Dizziness and fainting.  You get a rash or develop hives.  You have a decrease in urine output.  Your urine turns a dark color or changes to pink, red, or brown. Any of the following symptoms occur over the next 10 days:  You have a temperature by mouth above 102 F (38.9 C), not controlled by medicine.  Shortness of breath.  Weakness after normal activity.  The white part of the eye turns yellow (jaundice).  You have a decrease in the amount of urine or are urinating less often.  Your urine turns a dark color or changes to pink, red, or brown. Document Released: 03/19/2000 Document Revised: 06/14/2011 Document Reviewed: 11/06/2007 [employer] Patient Information 2014 [place], [place].  _______________________________________________________________________

## 2017-11-16 ENCOUNTER — Encounter (HOSPITAL_COMMUNITY): Payer: Self-pay

## 2017-11-16 ENCOUNTER — Other Ambulatory Visit: Payer: Self-pay

## 2017-11-16 ENCOUNTER — Encounter (HOSPITAL_COMMUNITY)
Admission: RE | Admit: 2017-11-16 | Discharge: 2017-11-16 | Disposition: A | Discharge status: Home / Self Care | Payer: Medicare Other | Source: Ambulatory Visit | Attending: Orthopedic Surgery | Admitting: Orthopedic Surgery

## 2017-11-16 DIAGNOSIS — M25661 Stiffness of right knee, not elsewhere classified: Secondary | ICD-10-CM | POA: Insufficient documentation

## 2017-11-16 DIAGNOSIS — Z0183 Encounter for blood typing: Secondary | ICD-10-CM | POA: Diagnosis not present

## 2017-11-16 DIAGNOSIS — Z96651 Presence of right artificial knee joint: Secondary | ICD-10-CM | POA: Diagnosis not present

## 2017-11-16 DIAGNOSIS — M25561 Pain in right knee: Secondary | ICD-10-CM | POA: Diagnosis not present

## 2017-11-16 DIAGNOSIS — M1712 Unilateral primary osteoarthritis, left knee: Secondary | ICD-10-CM | POA: Diagnosis not present

## 2017-11-16 DIAGNOSIS — T8484XA Pain due to internal orthopedic prosthetic devices, implants and grafts, initial encounter: Secondary | ICD-10-CM | POA: Insufficient documentation

## 2017-11-16 DIAGNOSIS — Z01812 Encounter for preprocedural laboratory examination: Secondary | ICD-10-CM | POA: Insufficient documentation

## 2017-11-16 DIAGNOSIS — Y838 Other surgical procedures as the cause of abnormal reaction of the patient, or of later complication, without mention of misadventure at the time of the procedure: Secondary | ICD-10-CM | POA: Diagnosis not present

## 2017-11-16 LAB — SURGICAL PCR SCREEN
MRSA, PCR: NEGATIVE
Staphylococcus aureus: NEGATIVE

## 2017-11-16 LAB — BASIC METABOLIC PANEL
Anion gap: 7 (ref 5–15)
BUN: 19 mg/dL (ref 8–23)
CHLORIDE: 103 mmol/L (ref 98–111)
CO2: 29 mmol/L (ref 22–32)
CREATININE: 0.85 mg/dL (ref 0.61–1.24)
Calcium: 9 mg/dL (ref 8.9–10.3)
GFR calc Af Amer: >60 mL/min (ref >60)
GFR calc non Af Amer: >60 mL/min (ref >60)
GLUCOSE: 67 mg/dL — AB (ref 70–99)
Potassium: 4.3 mmol/L (ref 3.5–5.1)
SODIUM: 139 mmol/L (ref 135–145)

## 2017-11-16 LAB — CBC
HCT: 38.6 % — ABNORMAL LOW (ref 39.0–52.0)
HEMOGLOBIN: 13.1 g/dL (ref 13.0–17.0)
MCH: 31.3 pg (ref 26.0–34.0)
MCHC: 33.9 g/dL (ref 30.0–36.0)
MCV: 92.1 fL (ref 78.0–100.0)
Platelets: 244 10*3/uL (ref 150–400)
RBC: 4.19 MIL/uL — ABNORMAL LOW (ref 4.22–5.81)
RDW: 14 % (ref 11.5–15.5)
WBC: 6.9 10*3/uL (ref 4.0–10.5)

## 2017-11-23 NOTE — Anesthesia Preprocedure Evaluation (Addendum)
Anesthesia Evaluation  Patient identified by MRN, date of birth, ID band Patient awake    Reviewed: Allergy & Precautions, H&P , NPO status , Patient's Chart, lab work & pertinent test results, reviewed documented beta blocker date and time   Airway Mallampati: III  TM Distance: >3 FB Neck ROM: Full    Dental no notable dental hx. (+) Teeth Intact, Dental Advisory Given   Pulmonary neg pulmonary ROS, former smoker,    Pulmonary exam normal breath sounds clear to auscultation       Cardiovascular hypertension, On Medications and On Home Beta Blockers + CAD and + Cardiac Stents   Rhythm:Regular Rate:Normal  Cath 16 1. Normal LV systolic function, 71-69%. 2. Small nondominant RCA. Large dominant circumflex coronary artery. Circumflex 99%/subtotally occluded. 3. Diffusely diseased LAD, proximal 40-50%, mid diffuse moderate disease, mid LAD with a 80% stenosis. 4. Diagonal 1 with a proximal 70% stenosis, small to moderate-sized vessel. 5. Successful PTCA and stenting of the mid circumflex coronary artery with implantation of a 4.0 x 30 mm resolute integrity DES, stenosis reduced from 99% to 0%, TIMI flow improved from 2-3 6. Successful PTCA and stenting of the mid LAD with implantation of a 2.75 x 22 mm resolute DES, 80% reduced to 0%.   Neuro/Psych negative neurological ROS  negative psych ROS   GI/Hepatic negative GI ROS, Neg liver ROS,   Endo/Other  negative endocrine ROS  Renal/GU negative Renal ROS  negative genitourinary   Musculoskeletal  (+) Arthritis , Osteoarthritis,    Abdominal   Peds  Hematology negative hematology ROS (+)   Anesthesia Other Findings   Reproductive/Obstetrics negative OB ROS                            Anesthesia Physical  Anesthesia Plan  ASA: III  Anesthesia Plan: MAC and Spinal   Post-op Pain Management:    Induction: Intravenous  PONV Risk Score and  Plan:   Airway Management Planned: Simple Face Mask and Nasal Cannula  Additional Equipment:   Intra-op Plan:   Post-operative Plan:   Informed Consent: I have reviewed the patients History and Physical, chart, labs and discussed the procedure including the risks, benefits and alternatives for the proposed anesthesia with the patient or authorized representative who has indicated his/her understanding and acceptance.   Dental advisory given  Plan Discussed with: CRNA, Anesthesiologist and Surgeon  Anesthesia Plan Comments: (Pt off Brilinta 5 days.)        Anesthesia Quick Evaluation

## 2017-11-24 ENCOUNTER — Inpatient Hospital Stay (HOSPITAL_COMMUNITY)
Admission: RE | Admit: 2017-11-24 | Discharge: 2017-11-25 | DRG: 465 | Disposition: A | Discharge status: Home / Self Care | Payer: Medicare Other | Source: Ambulatory Visit | Attending: Orthopedic Surgery | Admitting: Orthopedic Surgery

## 2017-11-24 ENCOUNTER — Encounter (HOSPITAL_COMMUNITY)
Admission: RE | Disposition: A | Discharge status: Home / Self Care | Payer: Self-pay | Source: Ambulatory Visit | Attending: Orthopedic Surgery

## 2017-11-24 ENCOUNTER — Other Ambulatory Visit: Payer: Self-pay

## 2017-11-24 ENCOUNTER — Inpatient Hospital Stay (HOSPITAL_COMMUNITY): Payer: Medicare Other | Admitting: Anesthesiology

## 2017-11-24 ENCOUNTER — Encounter (HOSPITAL_COMMUNITY): Payer: Self-pay | Admitting: *Deleted

## 2017-11-24 DIAGNOSIS — Z7901 Long term (current) use of anticoagulants: Secondary | ICD-10-CM | POA: Diagnosis not present

## 2017-11-24 DIAGNOSIS — Y792 Prosthetic and other implants, materials and accessory orthopedic devices associated with adverse incidents: Secondary | ICD-10-CM | POA: Diagnosis present

## 2017-11-24 DIAGNOSIS — M1712 Unilateral primary osteoarthritis, left knee: Secondary | ICD-10-CM | POA: Diagnosis present

## 2017-11-24 DIAGNOSIS — L905 Scar conditions and fibrosis of skin: Secondary | ICD-10-CM | POA: Diagnosis present

## 2017-11-24 DIAGNOSIS — Z79899 Other long term (current) drug therapy: Secondary | ICD-10-CM | POA: Diagnosis not present

## 2017-11-24 DIAGNOSIS — Z96651 Presence of right artificial knee joint: Secondary | ICD-10-CM

## 2017-11-24 DIAGNOSIS — Z87891 Personal history of nicotine dependence: Secondary | ICD-10-CM

## 2017-11-24 DIAGNOSIS — Z955 Presence of coronary angioplasty implant and graft: Secondary | ICD-10-CM | POA: Diagnosis not present

## 2017-11-24 DIAGNOSIS — E785 Hyperlipidemia, unspecified: Secondary | ICD-10-CM | POA: Diagnosis present

## 2017-11-24 DIAGNOSIS — Y834 Other reconstructive surgery as the cause of abnormal reaction of the patient, or of later complication, without mention of misadventure at the time of the procedure: Secondary | ICD-10-CM | POA: Diagnosis present

## 2017-11-24 DIAGNOSIS — Z885 Allergy status to narcotic agent status: Secondary | ICD-10-CM | POA: Diagnosis not present

## 2017-11-24 DIAGNOSIS — T8484XA Pain due to internal orthopedic prosthetic devices, implants and grafts, initial encounter: Secondary | ICD-10-CM | POA: Diagnosis present

## 2017-11-24 DIAGNOSIS — I2510 Atherosclerotic heart disease of native coronary artery without angina pectoris: Secondary | ICD-10-CM | POA: Diagnosis present

## 2017-11-24 HISTORY — PX: TOTAL KNEE REVISION WITH SCAR DEBRIDEMENT/PATELLA REVISION WITH POLY EXCHANGE: SHX6128

## 2017-11-24 HISTORY — PX: STERIOD INJECTION: SHX5046

## 2017-11-24 LAB — TYPE AND SCREEN
ABO/RH(D): O POS
ANTIBODY SCREEN: NEGATIVE

## 2017-11-24 SURGERY — TOTAL KNEE REVISION WITH SCAR DEBRIDEMENT/PATELLA REVISION WITH POLY EXCHANGE
Anesthesia: Monitor Anesthesia Care | Site: Knee | Laterality: Right

## 2017-11-24 MED ORDER — SODIUM CHLORIDE 0.9 % IV SOLN
INTRAVENOUS | Status: DC | PRN
  Administered 2017-11-24: 40 ug/min via INTRAVENOUS

## 2017-11-24 MED ORDER — METHOCARBAMOL 500 MG IVPB - SIMPLE MED
500.0000 mg | Freq: Four times a day (QID) | INTRAVENOUS | Status: DC | PRN
  Filled 2017-11-24: qty 50

## 2017-11-24 MED ORDER — GLYCOPYRROLATE PF 0.2 MG/ML IJ SOSY
PREFILLED_SYRINGE | INTRAMUSCULAR | Status: AC
  Filled 2017-11-24: qty 1

## 2017-11-24 MED ORDER — MIDAZOLAM HCL 5 MG/5ML IJ SOLN
INTRAMUSCULAR | Status: DC | PRN
  Administered 2017-11-24: 1 mg via INTRAVENOUS

## 2017-11-24 MED ORDER — METHYLPREDNISOLONE ACETATE 40 MG/ML IJ SUSP
INTRAMUSCULAR | Status: DC | PRN
  Administered 2017-11-24: 80 mg via INTRA_ARTICULAR

## 2017-11-24 MED ORDER — BISACODYL 10 MG RE SUPP: 10.0000 mg | Freq: Every day | RECTAL | Status: DC | PRN

## 2017-11-24 MED ORDER — FENTANYL CITRATE (PF) 100 MCG/2ML IJ SOLN: 25.0000 ug | INTRAMUSCULAR | Status: DC | PRN

## 2017-11-24 MED ORDER — PHENOL 1.4 % MT LIQD: 1.0000 | OROMUCOSAL | Status: DC | PRN

## 2017-11-24 MED ORDER — SODIUM CHLORIDE 0.9 % IV SOLN
INTRAVENOUS | Status: DC
  Administered 2017-11-24 – 2017-11-25 (×2): via INTRAVENOUS

## 2017-11-24 MED ORDER — PHENYLEPHRINE HCL 10 MG/ML IJ SOLN
INTRAMUSCULAR | Status: AC
  Filled 2017-11-24: qty 1

## 2017-11-24 MED ORDER — BUPIVACAINE-EPINEPHRINE (PF) 0.25% -1:200000 IJ SOLN
INTRAMUSCULAR | Status: AC
  Filled 2017-11-24: qty 30

## 2017-11-24 MED ORDER — HYDROCODONE-ACETAMINOPHEN 5-325 MG PO TABS: 1.0000 | ORAL_TABLET | ORAL | Status: DC | PRN

## 2017-11-24 MED ORDER — MIDAZOLAM HCL 2 MG/2ML IJ SOLN
INTRAMUSCULAR | Status: AC
  Filled 2017-11-24: qty 2

## 2017-11-24 MED ORDER — GLYCOPYRROLATE 0.2 MG/ML IJ SOLN
INTRAMUSCULAR | Status: DC | PRN
  Administered 2017-11-24: 0.2 mg via INTRAVENOUS

## 2017-11-24 MED ORDER — CEFAZOLIN SODIUM-DEXTROSE 2-4 GM/100ML-% IV SOLN
2.0000 g | INTRAVENOUS | Status: AC
  Administered 2017-11-24: 2 g via INTRAVENOUS
  Filled 2017-11-24: qty 100

## 2017-11-24 MED ORDER — ALUM & MAG HYDROXIDE-SIMETH 200-200-20 MG/5ML PO SUSP: 15.0000 mL | ORAL | Status: DC | PRN

## 2017-11-24 MED ORDER — ONDANSETRON HCL 4 MG PO TABS: 4.0000 mg | ORAL_TABLET | Freq: Four times a day (QID) | ORAL | Status: DC | PRN

## 2017-11-24 MED ORDER — DEXAMETHASONE SODIUM PHOSPHATE 10 MG/ML IJ SOLN
10.0000 mg | Freq: Once | INTRAMUSCULAR | Status: AC
  Administered 2017-11-24: 10 mg via INTRAVENOUS

## 2017-11-24 MED ORDER — MAGNESIUM CITRATE PO SOLN: 1.0000 | Freq: Once | ORAL | Status: DC | PRN

## 2017-11-24 MED ORDER — 0.9 % SODIUM CHLORIDE (POUR BTL) OPTIME
TOPICAL | Status: DC | PRN
  Administered 2017-11-24: 1000 mL

## 2017-11-24 MED ORDER — ONDANSETRON HCL 4 MG/2ML IJ SOLN
INTRAMUSCULAR | Status: AC
  Filled 2017-11-24: qty 2

## 2017-11-24 MED ORDER — POLYETHYLENE GLYCOL 3350 17 G PO PACK: 17.0000 g | PACK | Freq: Two times a day (BID) | ORAL | 0 refills | Status: DC

## 2017-11-24 MED ORDER — ONDANSETRON HCL 4 MG/2ML IJ SOLN: 4.0000 mg | Freq: Four times a day (QID) | INTRAMUSCULAR | Status: DC | PRN

## 2017-11-24 MED ORDER — FERROUS SULFATE 325 (65 FE) MG PO TABS
325.0000 mg | ORAL_TABLET | Freq: Two times a day (BID) | ORAL | Status: DC
  Administered 2017-11-25: 325 mg via ORAL
  Filled 2017-11-24: qty 1

## 2017-11-24 MED ORDER — TAMSULOSIN HCL 0.4 MG PO CAPS
0.4000 mg | ORAL_CAPSULE | Freq: Every day | ORAL | Status: DC
  Administered 2017-11-25: 0.4 mg via ORAL
  Filled 2017-11-24: qty 1

## 2017-11-24 MED ORDER — ATORVASTATIN CALCIUM 40 MG PO TABS
40.0000 mg | ORAL_TABLET | Freq: Every day | ORAL | Status: DC
  Administered 2017-11-25: 40 mg via ORAL
  Filled 2017-11-24: qty 1

## 2017-11-24 MED ORDER — BUPIVACAINE IN DEXTROSE 0.75-8.25 % IT SOLN
INTRATHECAL | Status: DC | PRN
  Administered 2017-11-24: 1.8 mL via INTRATHECAL

## 2017-11-24 MED ORDER — CEFAZOLIN SODIUM-DEXTROSE 2-4 GM/100ML-% IV SOLN
2.0000 g | Freq: Four times a day (QID) | INTRAVENOUS | Status: AC
  Administered 2017-11-24 (×2): 2 g via INTRAVENOUS
  Filled 2017-11-24: qty 100

## 2017-11-24 MED ORDER — METHYLPREDNISOLONE ACETATE 40 MG/ML IJ SUSP
INTRAMUSCULAR | Status: AC
  Filled 2017-11-24: qty 1

## 2017-11-24 MED ORDER — NITROGLYCERIN 0.4 MG SL SUBL: 0.4000 mg | SUBLINGUAL_TABLET | SUBLINGUAL | Status: DC | PRN

## 2017-11-24 MED ORDER — DIPHENHYDRAMINE HCL 12.5 MG/5ML PO ELIX: 12.5000 mg | ORAL_SOLUTION | ORAL | Status: DC | PRN

## 2017-11-24 MED ORDER — PROPOFOL 10 MG/ML IV BOLUS
INTRAVENOUS | Status: AC
  Filled 2017-11-24: qty 60

## 2017-11-24 MED ORDER — LACTATED RINGERS IV SOLN
INTRAVENOUS | Status: DC
  Administered 2017-11-24 (×2): via INTRAVENOUS

## 2017-11-24 MED ORDER — TRANEXAMIC ACID 1000 MG/10ML IV SOLN
1000.0000 mg | Freq: Once | INTRAVENOUS | Status: AC
  Administered 2017-11-24: 1000 mg via INTRAVENOUS
  Filled 2017-11-24: qty 1000

## 2017-11-24 MED ORDER — BUPIVACAINE HCL (PF) 0.25 % IJ SOLN
INTRAMUSCULAR | Status: AC
  Filled 2017-11-24: qty 30

## 2017-11-24 MED ORDER — BUPIVACAINE HCL (PF) 0.25 % IJ SOLN
INTRAMUSCULAR | Status: DC | PRN
  Administered 2017-11-24: 6 mL

## 2017-11-24 MED ORDER — METOCLOPRAMIDE HCL 5 MG PO TABS: 5.0000 mg | ORAL_TABLET | Freq: Three times a day (TID) | ORAL | Status: DC | PRN

## 2017-11-24 MED ORDER — SODIUM CHLORIDE 0.9 % IV SOLN: 30.0000 ug/min | INTRAVENOUS | Status: DC

## 2017-11-24 MED ORDER — APIXABAN 2.5 MG PO TABS
2.5000 mg | ORAL_TABLET | Freq: Two times a day (BID) | ORAL | Status: DC
  Administered 2017-11-25: 2.5 mg via ORAL
  Filled 2017-11-24: qty 1

## 2017-11-24 MED ORDER — CELECOXIB 200 MG PO CAPS
200.0000 mg | ORAL_CAPSULE | Freq: Two times a day (BID) | ORAL | Status: DC
  Administered 2017-11-24: 200 mg via ORAL
  Filled 2017-11-24 (×2): qty 1

## 2017-11-24 MED ORDER — FERROUS SULFATE 325 (65 FE) MG PO TABS: 325.0000 mg | ORAL_TABLET | Freq: Three times a day (TID) | ORAL | 3 refills | Status: DC

## 2017-11-24 MED ORDER — ONDANSETRON HCL 4 MG/2ML IJ SOLN
INTRAMUSCULAR | Status: DC | PRN
  Administered 2017-11-24: 4 mg via INTRAVENOUS

## 2017-11-24 MED ORDER — SODIUM CHLORIDE 0.9 % IJ SOLN
INTRAMUSCULAR | Status: AC
  Filled 2017-11-24: qty 50

## 2017-11-24 MED ORDER — BUPIVACAINE-EPINEPHRINE (PF) 0.25% -1:200000 IJ SOLN
INTRAMUSCULAR | Status: DC | PRN
  Administered 2017-11-24: 30 mL

## 2017-11-24 MED ORDER — HYDROCODONE-ACETAMINOPHEN 5-325 MG PO TABS: 2.0000 | ORAL_TABLET | ORAL | Status: DC | PRN

## 2017-11-24 MED ORDER — DOCUSATE SODIUM 100 MG PO CAPS
100.0000 mg | ORAL_CAPSULE | Freq: Two times a day (BID) | ORAL | Status: DC
  Administered 2017-11-24 – 2017-11-25 (×2): 100 mg via ORAL
  Filled 2017-11-24 (×2): qty 1

## 2017-11-24 MED ORDER — METHOCARBAMOL 500 MG PO TABS: 500.0000 mg | ORAL_TABLET | Freq: Four times a day (QID) | ORAL | 0 refills | Status: DC | PRN

## 2017-11-24 MED ORDER — SODIUM CHLORIDE 0.9 % IR SOLN
Status: DC | PRN
  Administered 2017-11-24: 1000 mL

## 2017-11-24 MED ORDER — DOCUSATE SODIUM 100 MG PO CAPS: 100.0000 mg | ORAL_CAPSULE | Freq: Two times a day (BID) | ORAL | 0 refills | Status: DC

## 2017-11-24 MED ORDER — ASPIRIN 81 MG PO CHEW: 81.0000 mg | CHEWABLE_TABLET | Freq: Two times a day (BID) | ORAL | 0 refills | Status: DC

## 2017-11-24 MED ORDER — METHOCARBAMOL 500 MG PO TABS: 500.0000 mg | ORAL_TABLET | Freq: Four times a day (QID) | ORAL | Status: DC | PRN

## 2017-11-24 MED ORDER — HYDROMORPHONE HCL 1 MG/ML IJ SOLN: 0.5000 mg | INTRAMUSCULAR | Status: DC | PRN

## 2017-11-24 MED ORDER — HYDROCODONE-ACETAMINOPHEN 5-325 MG PO TABS: 1.0000 | ORAL_TABLET | ORAL | 0 refills | Status: DC | PRN

## 2017-11-24 MED ORDER — SODIUM CHLORIDE 0.9 % IV SOLN
1000.0000 mg | INTRAVENOUS | Status: AC
  Administered 2017-11-24: 1000 mg via INTRAVENOUS
  Filled 2017-11-24: qty 10

## 2017-11-24 MED ORDER — KETOROLAC TROMETHAMINE 30 MG/ML IJ SOLN
INTRAMUSCULAR | Status: AC
  Filled 2017-11-24: qty 2

## 2017-11-24 MED ORDER — FENTANYL CITRATE (PF) 100 MCG/2ML IJ SOLN
INTRAMUSCULAR | Status: AC
  Filled 2017-11-24: qty 2

## 2017-11-24 MED ORDER — DEXAMETHASONE SODIUM PHOSPHATE 10 MG/ML IJ SOLN
INTRAMUSCULAR | Status: AC
  Filled 2017-11-24: qty 1

## 2017-11-24 MED ORDER — KETOROLAC TROMETHAMINE 30 MG/ML IJ SOLN
INTRAMUSCULAR | Status: DC | PRN
  Administered 2017-11-24: 30 mg

## 2017-11-24 MED ORDER — METOPROLOL SUCCINATE ER 25 MG PO TB24
12.5000 mg | ORAL_TABLET | Freq: Every day | ORAL | Status: DC
  Administered 2017-11-25: 12.5 mg via ORAL
  Filled 2017-11-24: qty 1

## 2017-11-24 MED ORDER — CHLORHEXIDINE GLUCONATE 4 % EX LIQD: 60.0000 mL | Freq: Once | CUTANEOUS | Status: DC

## 2017-11-24 MED ORDER — DEXAMETHASONE SODIUM PHOSPHATE 10 MG/ML IJ SOLN
10.0000 mg | Freq: Once | INTRAMUSCULAR | Status: AC
  Administered 2017-11-25: 10 mg via INTRAVENOUS
  Filled 2017-11-24: qty 1

## 2017-11-24 MED ORDER — PROPOFOL 500 MG/50ML IV EMUL
INTRAVENOUS | Status: DC | PRN
  Administered 2017-11-24: 50 ug/kg/min via INTRAVENOUS

## 2017-11-24 MED ORDER — MEPERIDINE HCL 50 MG/ML IJ SOLN: 6.2500 mg | INTRAMUSCULAR | Status: DC | PRN

## 2017-11-24 MED ORDER — METOCLOPRAMIDE HCL 5 MG/ML IJ SOLN: 5.0000 mg | Freq: Three times a day (TID) | INTRAMUSCULAR | Status: DC | PRN

## 2017-11-24 MED ORDER — POLYETHYLENE GLYCOL 3350 17 G PO PACK
17.0000 g | PACK | Freq: Two times a day (BID) | ORAL | Status: DC
  Administered 2017-11-24: 17 g via ORAL
  Filled 2017-11-24 (×2): qty 1

## 2017-11-24 MED ORDER — SODIUM CHLORIDE 0.9 % IJ SOLN
INTRAMUSCULAR | Status: DC | PRN
  Administered 2017-11-24: 30 mL

## 2017-11-24 MED ORDER — MENTHOL 3 MG MT LOZG: 1.0000 | LOZENGE | OROMUCOSAL | Status: DC | PRN

## 2017-11-24 MED ORDER — PROPOFOL 10 MG/ML IV BOLUS
INTRAVENOUS | Status: DC | PRN
  Administered 2017-11-24: 20 mg via INTRAVENOUS
  Administered 2017-11-24: 30 mg via INTRAVENOUS

## 2017-11-24 SURGICAL SUPPLY — 64 items
ADH SKN CLS APL DERMABOND .7 (GAUZE/BANDAGES/DRESSINGS) ×2
BAG SPEC THK2 15X12 ZIP CLS (MISCELLANEOUS) ×2
BAG ZIPLOCK 12X15 (MISCELLANEOUS) ×4 IMPLANT
BANDAGE ACE 6X5 VEL STRL LF (GAUZE/BANDAGES/DRESSINGS) ×4 IMPLANT
BANDAGE ESMARK 6X9 LF (GAUZE/BANDAGES/DRESSINGS) ×2 IMPLANT
BLADE SAW SGTL 11.0X1.19X90.0M (BLADE) IMPLANT
BLADE SAW SGTL 13.0X1.19X90.0M (BLADE) ×2 IMPLANT
BLADE SAW SGTL 81X20 HD (BLADE) ×2 IMPLANT
BNDG CMPR 9X6 STRL LF SNTH (GAUZE/BANDAGES/DRESSINGS) ×2
BNDG ESMARK 6X9 LF (GAUZE/BANDAGES/DRESSINGS) ×4
BOWL SMART MIX CTS (DISPOSABLE) IMPLANT
COVER SURGICAL LIGHT HANDLE (MISCELLANEOUS) ×4 IMPLANT
CUFF TOURN SGL QUICK 34 (TOURNIQUET CUFF) ×4
CUFF TRNQT CYL 34X4X40X1 (TOURNIQUET CUFF) ×2 IMPLANT
DERMABOND ADVANCED (GAUZE/BANDAGES/DRESSINGS) ×2
DERMABOND ADVANCED .7 DNX12 (GAUZE/BANDAGES/DRESSINGS) ×2 IMPLANT
DRAPE EXTREMITY T 121X128X90 (DRAPE) ×4 IMPLANT
DRAPE POUCH INSTRU U-SHP 10X18 (DRAPES) ×4 IMPLANT
DRAPE U-SHAPE 47X51 STRL (DRAPES) ×4 IMPLANT
DRESSING AQUACEL AG SP 3.5X10 (GAUZE/BANDAGES/DRESSINGS) IMPLANT
DRSG ADAPTIC 3X8 NADH LF (GAUZE/BANDAGES/DRESSINGS) ×4 IMPLANT
DRSG AQUACEL AG ADV 3.5X10 (GAUZE/BANDAGES/DRESSINGS) ×4 IMPLANT
DRSG AQUACEL AG SP 3.5X10 (GAUZE/BANDAGES/DRESSINGS) ×4
DRSG PAD ABDOMINAL 8X10 ST (GAUZE/BANDAGES/DRESSINGS) ×4 IMPLANT
DURAPREP 26ML APPLICATOR (WOUND CARE) ×4 IMPLANT
ELECT REM PT RETURN 15FT ADLT (MISCELLANEOUS) ×4 IMPLANT
FACESHIELD WRAPAROUND (MASK) ×20 IMPLANT
FACESHIELD WRAPAROUND OR TEAM (MASK) ×10 IMPLANT
GAUZE SPONGE 4X4 12PLY STRL (GAUZE/BANDAGES/DRESSINGS) ×8 IMPLANT
GLOVE BIOGEL M 7.0 STRL (GLOVE) IMPLANT
GLOVE BIOGEL PI IND STRL 7.5 (GLOVE) ×2 IMPLANT
GLOVE BIOGEL PI IND STRL 8.5 (GLOVE) ×4 IMPLANT
GLOVE BIOGEL PI INDICATOR 7.5 (GLOVE) ×2
GLOVE BIOGEL PI INDICATOR 8.5 (GLOVE) ×4
GLOVE ECLIPSE 8.0 STRL XLNG CF (GLOVE) ×4 IMPLANT
GLOVE ORTHO TXT STRL SZ7.5 (GLOVE) ×8 IMPLANT
GOWN STRL REUS W/TWL LRG LVL3 (GOWN DISPOSABLE) ×4 IMPLANT
GOWN STRL REUS W/TWL XL LVL3 (GOWN DISPOSABLE) ×8 IMPLANT
HANDPIECE INTERPULSE COAX TIP (DISPOSABLE) ×4
IMMOBILIZER KNEE 20 (SOFTGOODS)
IMMOBILIZER KNEE 20 THIGH 36 (SOFTGOODS) IMPLANT
MANIFOLD NEPTUNE II (INSTRUMENTS) ×4 IMPLANT
NDL SAFETY ECLIPSE 18X1.5 (NEEDLE) ×4 IMPLANT
NEEDLE HYPO 18GX1.5 SHARP (NEEDLE) ×4
NS IRRIG 1000ML POUR BTL (IV SOLUTION) ×4 IMPLANT
PADDING CAST COTTON 6X4 STRL (CAST SUPPLIES) ×8 IMPLANT
PLATE ROT INSERT 12.5MM SIZE 4 (Plate) ×2 IMPLANT
POSITIONER SURGICAL ARM (MISCELLANEOUS) ×4 IMPLANT
SET HNDPC FAN SPRY TIP SCT (DISPOSABLE) ×2 IMPLANT
SET PAD KNEE POSITIONER (MISCELLANEOUS) ×4 IMPLANT
SPONGE LAP 18X18 RF (DISPOSABLE) ×4 IMPLANT
STAPLER VISISTAT 35W (STAPLE) IMPLANT
SUCTION FRAZIER HANDLE 12FR (TUBING) ×2
SUCTION TUBE FRAZIER 12FR DISP (TUBING) ×2 IMPLANT
SUT VIC AB 1 CT1 36 (SUTURE) ×12 IMPLANT
SUT VIC AB 2-0 CT1 27 (SUTURE) ×12
SUT VIC AB 2-0 CT1 TAPERPNT 27 (SUTURE) ×6 IMPLANT
SYR 50ML LL SCALE MARK (SYRINGE) ×4 IMPLANT
TOWEL OR 17X26 10 PK STRL BLUE (TOWEL DISPOSABLE) ×8 IMPLANT
TOWER CARTRIDGE SMART MIX (DISPOSABLE) ×2 IMPLANT
TRAY FOLEY MTR SLVR 16FR STAT (SET/KITS/TRAYS/PACK) ×4 IMPLANT
WATER STERILE IRR 1000ML POUR (IV SOLUTION) ×4 IMPLANT
WRAP KNEE MAXI GEL POST OP (GAUZE/BANDAGES/DRESSINGS) ×4 IMPLANT
YANKAUER SUCT BULB TIP 10FT TU (MISCELLANEOUS) IMPLANT

## 2017-11-24 NOTE — Interval H&P Note (Signed)
History and Physical Interval Note:  11/24/2017 7:29 AM  Stuart Flores  has presented today for surgery, with the diagnosis of Right knee painful scarring total knee, left knee osteoarthritis  The various methods of treatment have been discussed with the patient and family. After consideration of risks, benefits and other options for treatment, the patient has consented to  Procedure(s) with comments: RIGHT KNEE OPEN ARTHROTOMY WITH SCAR DEBRIDEMENT AND POSSIBLE POLY EXCHANGE (Right) - 90 mins LEFT KNEE CORTISONE INJECTION (Left) as a surgical intervention .  The patient's history has been reviewed, patient examined, no change in status, stable for surgery.  I have reviewed the patient's chart and labs.  Questions were answered to the patient's satisfaction.     Shelda PalMatthew D Kearstin Learn

## 2017-11-24 NOTE — Brief Op Note (Signed)
11/24/2017  8:42 AM  PATIENT:  Stuart HazyKenneth W Flores  84 y.o. male  PRE-OPERATIVE DIAGNOSIS:  Right knee painful scarring total knee, left knee osteoarthritis  POST-OPERATIVE DIAGNOSIS:  1.  Right knee painful scarring total knee (patella clunk), 2.  left knee osteoarthritis  PROCEDURE:  Procedure(s) with comments: RIGHT KNEE OPEN ARTHROTOMY WITH SCAR DEBRIDEMENT AND POSSIBLE POLY EXCHANGE (Right) - 90 mins LEFT KNEE CORTISONE INJECTION (Left)  SURGEON:  Surgeon(s) and Role:    Durene Romans* Jeyson Deshotel, MD - Primary  PHYSICIAN ASSISTANT: Lanney GinsMatthew Babish, PA-C  ANESTHESIA:   spinal  EBL:  50 mL   BLOOD ADMINISTERED:none  DRAINS: none   LOCAL MEDICATIONS USED:  MARCAINE preparation in the right knee and 80 mg of Depomedrol and marcaine in the left  SPECIMEN:  No Specimen  DISPOSITION OF SPECIMEN:  N/A  COUNTS:  YES  TOURNIQUET:   Total Tourniquet Time Documented: Thigh (Right) - 15 minutes Total: Thigh (Right) - 15 minutes   DICTATION: .Other Dictation: Dictation Number [id number]  PLAN OF CARE: Admit for overnight observation  PATIENT DISPOSITION:  PACU - hemodynamically stable.   Delay start of Pharmacological VTE agent (>24hrs) due to surgical blood loss or risk of bleeding: no

## 2017-11-24 NOTE — Evaluation (Signed)
Physical Therapy Evaluation Patient Details Name: Stuart Flores MRN: [id number] DOB: 1933-10-23 Today's Date: 11/24/2017   History of Present Illness  84 YO male s/p R TKR revision (open arthrotomy with scar debridement and poly exchange) on 11/24/17. PMH includes squamous cell carcinoma, hyperlipidemia, glaucoma (resolved), CAD, bradycardia, OA. Surgical history includes R TKR, knee arthroscopy, L hand surgery, coronary angioplasty with stent placement in 2016, cardiac cath 2016, cataracts extraction in 2015.  Clinical Impression   Pt is a 84 YO male s/p R TKR revision on 11/24/17. PMH as listed above. Pt presents with R knee pain, difficulty with bed mobility/transfers/ambulation, and decreased activity tolerance. Pt would benefit from acute PT to address these deficits. Pt ambulated 70 feet with RW with min guard assist today. Pt's plan is to d/c home tomorrow with OPPT. Pt states that "PT didn't help me last time", but this is a appropriate recommendation for pt. Will continue to follow acutely.    Follow Up Recommendations Follow surgeon's recommendation for DC plan and follow-up therapies;Supervision for mobility/OOB(OPPT )    Equipment Recommendations  None recommended by PT    Recommendations for Other Services       Precautions / Restrictions Precautions Precautions: Knee;Fall Restrictions Weight Bearing Restrictions: No Other Position/Activity Restrictions: WBAT       Mobility  Bed Mobility Overal bed mobility: Needs Assistance Bed Mobility: Supine to Sit     Supine to sit: Min assist     General bed mobility comments: Min assist for RLE management and scooting. Verbal cuing for sequencing and scooting.   Transfers Overall transfer level: Needs assistance Equipment used: Rolling walker (2 wheeled) Transfers: Sit to/from Stand Sit to Stand: Min assist         General transfer comment: Min assist for power up, trunk elevation, and steadying upon standing.  Weightshifting L and R upon initial standing. Verbal cuing for hand placement.   Ambulation/Gait Ambulation/Gait assistance: Min guard Gait Distance (Feet): 70 Feet Assistive device: Rolling walker (2 wheeled) Gait Pattern/deviations: Step-to pattern;Decreased stride length;Trunk flexed;Antalgic;Decreased weight shift to right Gait velocity: decreased    General Gait Details: Min guard for safety. Verbal cuing for stepping into RW, sequencing RW (push RW, RLE, LLE).   Stairs            Wheelchair Mobility    Modified Rankin (Stroke Patients Only)       Balance Overall balance assessment: Mild deficits observed, not formally tested                                           Pertinent Vitals/Pain Pain Assessment: 0-10 Pain Score: 2  Pain Descriptors / Indicators: Grimacing;Operative site guarding Pain Intervention(s): Limited activity within patient's tolerance;Ice applied;Monitored during session    Home Living Family/patient expects to be discharged to:: Private residence Living Arrangements: Spouse/significant other Available Help at Discharge: Family;Available 24 hours/day Type of Home: House Home Access: Stairs to enter Entrance Stairs-Rails: Can reach both Entrance Stairs-Number of Steps: 2  Home Layout: Two level;Able to live on main level with bedroom/bathroom(master bedroom and bath on first floor ) Home Equipment: Walker - 2 wheels;Cane - single point;Toilet riser;Bedside commode      Prior Function Level of Independence: Independent               Hand Dominance        Extremity/Trunk Assessment   Upper  Extremity Assessment Upper Extremity Assessment: Overall WFL for tasks assessed    Lower Extremity Assessment Lower Extremity Assessment: RLE deficits/detail RLE Deficits / Details: suspected post-surgical RLE weakness; able to perform quad sets x10  RLE: Unable to fully assess due to pain RLE Sensation: WNL RLE  Coordination: WNL       Communication   Communication: No difficulties  Cognition Arousal/Alertness: Awake/alert Behavior During Therapy: WFL for tasks assessed/performed Overall Cognitive Status: Within Functional Limits for tasks assessed                                        General Comments      Exercises Total Joint Exercises Ankle Circles/Pumps: AROM;20 reps;Both;Supine Quad Sets: AROM;Right;10 reps;Supine Heel Slides: AAROM;Right;10 reps;Supine Goniometric ROM: knee AAROM approximately 10-60 degrees, limited by pain    Assessment/Plan    PT Assessment Patient needs continued PT services  PT Problem List Decreased strength;Decreased range of motion;Decreased activity tolerance;Decreased knowledge of use of DME;Decreased safety awareness;Decreased mobility;Decreased balance       PT Treatment Interventions DME instruction;Therapeutic activities;Gait training;Therapeutic exercise;Patient/family education;Stair training;Balance training;Functional mobility training    PT Goals (Current goals can be found in the Care Plan section)  Acute Rehab PT Goals PT Goal Formulation: With patient Time For Goal Achievement: 12/08/17 Potential to Achieve Goals: Good    Frequency 7X/week   Barriers to discharge        Co-evaluation               AM-PAC PT "6 Clicks" Daily Activity  Outcome Measure Difficulty turning over in bed (including adjusting bedclothes, sheets and blankets)?: Unable Difficulty moving from lying on back to sitting on the side of the bed? : Unable Difficulty sitting down on and standing up from a chair with arms (e.g., wheelchair, bedside commode, etc,.)?: Unable Help needed moving to and from a bed to chair (including a wheelchair)?: A Little Help needed walking in hospital room?: A Little Help needed climbing 3-5 steps with a railing? : A Little 6 Click Score: 12    End of Session Equipment Utilized During Treatment: Gait  belt;Oxygen(O2 via [place] applied post-session ) Activity Tolerance: Patient tolerated treatment well;No increased pain Patient left: in chair;with chair alarm set;with SCD's reapplied;with call bell/phone within reach Nurse Communication: Mobility status PT Visit Diagnosis: Other abnormalities of gait and mobility (R26.89);Difficulty in walking, not elsewhere classified (R26.2)    Time: 1350-1415 PT Time Calculation (min) (ACUTE ONLY): 25 min   Charges:   PT Evaluation $PT Eval Low Complexity: 1 Low PT Treatments $Gait Training: 8-22 mins        [place] Dowd Terrial Rhodes Cameron Schwinn, PT, DPT  Pager # [phone]    Marivel Mcclarty D Samanvitha Germany 11/24/2017, 3:17 PM

## 2017-11-24 NOTE — Anesthesia Procedure Notes (Addendum)
Spinal  Patient location during procedure: OR Start time: 11/24/2017 7:38 AM End time: 11/24/2017 7:43 AM Reason for block: at surgeon's request Staffing Resident/CRNA: Anne Fu, CRNA Performed: resident/CRNA  Preanesthetic Checklist Completed: patient identified, site marked, surgical consent, pre-op evaluation, timeout performed, IV checked, risks and benefits discussed and monitors and equipment checked Spinal Block Patient position: sitting Prep: DuraPrep Patient monitoring: heart rate, continuous pulse ox and blood pressure Approach: right paramedian Location: L2-3 Injection technique: single-shot Needle Needle type: Pencan  Needle gauge: 24 G Needle length: 9 cm Assessment Sensory level: T6 Additional Notes  Functioning IV was confirmed and monitors were applied. Expiration date of kit checked and confirmed. Sterile prep and drape, including hand hygiene and sterile gloves were used. The patient was positioned and the spine was prepped. The skin was anesthetized with lidocaine.  Free flow of clear CSF was obtained prior to injecting local anesthetic into the CSF X 2 attempts (first 24G right paramedian).  The spinal needle aspirated freely following injection.  The needle was carefully withdrawn. Patient tolerated procedure well, without complications. Loss of motor and sensory on exam post injection.

## 2017-11-24 NOTE — Transfer of Care (Signed)
Immediate Anesthesia Transfer of Care Note  Patient: Stuart HazyKenneth W Ohair  Procedure(s) Performed: Procedure(s) with comments: RIGHT KNEE OPEN ARTHROTOMY WITH SCAR DEBRIDEMENT AND POSSIBLE POLY EXCHANGE (Right) - 90 mins LEFT KNEE CORTISONE INJECTION (Left)  Patient Location: PACU  Anesthesia Type:Spinal  Level of Consciousness:  sedated, patient cooperative and responds to stimulation  Airway & Oxygen Therapy:Patient Spontanous Breathing and Patient connected to face mask oxgen  Post-op Assessment:  Report given to PACU RN and Post -op Vital signs reviewed and stable  Post vital signs:  Reviewed and stable  Last Vitals:  Vitals:   11/24/17 0556 11/24/17 0909  BP: 116/81 100/65  Pulse: (!) 46 (!) 59  Resp:  15  Temp: 36.6 C (!) 36.4 C  SpO2: 100% 100%    Complications: No apparent anesthesia complications

## 2017-11-24 NOTE — Discharge Instructions (Addendum)
Information on my medicine - ELIQUIS (apixaban)  This medication education was reviewed with me or my healthcare representative as part of my discharge preparation.  The pharmacist that spoke with me during my hospital stay was:    Why was Eliquis prescribed for you? Eliquis was prescribed for you to reduce the risk of blood clots forming after orthopedic surgery.    What do You need to know about Eliquis? Take your Eliquis TWICE DAILY - one tablet in the morning and one tablet in the evening with or without food.  It would be best to take the dose about the same time each day.  If you have difficulty swallowing the tablet whole please discuss with your pharmacist how to take the medication safely.  Take Eliquis exactly as prescribed by your doctor and DO NOT stop taking Eliquis without talking to the doctor who prescribed the medication.  Stopping without other medication to take the place of Eliquis may increase your risk of developing a clot.  After discharge, you should have regular check-up appointments with your healthcare provider that is prescribing your Eliquis.  What do you do if you miss a dose? If a dose of ELIQUIS is not taken at the scheduled time, take it as soon as possible on the same day and twice-daily administration should be resumed.  The dose should not be doubled to make up for a missed dose.  Do not take more than one tablet of ELIQUIS at the same time.  Important Safety Information A possible side effect of Eliquis is bleeding. You should call your healthcare provider right away if you experience any of the following: ? Bleeding from an injury or your nose that does not stop. ? Unusual colored urine (red or dark brown) or unusual colored stools (red or black). ? Unusual bruising for unknown reasons. ? A serious fall or if you hit your head (even if there is no bleeding).  Some medicines may interact with Eliquis and might increase your risk of bleeding  or clotting while on Eliquis. To help avoid this, consult your healthcare provider or pharmacist prior to using any new prescription or non-prescription medications, including herbals, vitamins, non-steroidal anti-inflammatory drugs (NSAIDs) and supplements.  This website has more information on Eliquis (apixaban): http://www.eliquis.com/eliquis/home  ____________________________________________________________________________________________   INSTRUCTIONS AFTER JOINT REPLACEMENT   o Remove items at home which could result in a fall. This includes throw rugs or furniture in walking pathways o ICE to the affected joint every three hours while awake for 30 minutes at a time, for at least the first 3-5 days, and then as needed for pain and swelling.  Continue to use ice for pain and swelling. You may notice swelling that will progress down to the foot and ankle.  This is normal after surgery.  Elevate your leg when you are not up walking on it.   o Continue to use the breathing machine you got in the hospital (incentive spirometer) which will help keep your temperature down.  It is common for your temperature to cycle up and down following surgery, especially at night when you are not up moving around and exerting yourself.  The breathing machine keeps your lungs expanded and your temperature down.   DIET:  As you were doing prior to hospitalization, we recommend a well-balanced diet.  DRESSING / WOUND CARE / SHOWERING  Keep the surgical dressing until follow up.  The dressing is water proof, so you can shower without any extra covering.  IF THE DRESSING FALLS OFF or the wound gets wet inside, change the dressing with sterile gauze.  Please use good hand washing techniques before changing the dressing.  Do not use any lotions or creams on the incision until instructed by your surgeon.    ACTIVITY  o Increase activity slowly as tolerated, but follow the weight bearing instructions below.   o No  driving for 6 weeks or until further direction given by your physician.  You cannot drive while taking narcotics.  o No lifting or carrying greater than 10 lbs. until further directed by your surgeon. o Avoid periods of inactivity such as sitting longer than an hour when not asleep. This helps prevent blood clots.  o You may return to work once you are authorized by your doctor.     WEIGHT BEARING   Weight bearing as tolerated with assist device (walker, cane, etc) as directed, use it as long as suggested by your surgeon or therapist, typically at least 4-6 weeks.   EXERCISES  Results after joint replacement surgery are often greatly improved when you follow the exercise, range of motion and muscle strengthening exercises prescribed by your doctor. Safety measures are also important to protect the joint from further injury. Any time any of these exercises cause you to have increased pain or swelling, decrease what you are doing until you are comfortable again and then slowly increase them. If you have problems or questions, call your caregiver or physical therapist for advice.   Rehabilitation is important following a joint replacement. After just a few days of immobilization, the muscles of the leg can become weakened and shrink (atrophy).  These exercises are designed to build up the tone and strength of the thigh and leg muscles and to improve motion. Often times heat used for twenty to thirty minutes before working out will loosen up your tissues and help with improving the range of motion but do not use heat for the first two weeks following surgery (sometimes heat can increase post-operative swelling).   These exercises can be done on a training (exercise) mat, on the floor, on a table or on a bed. Use whatever works the best and is most comfortable for you.    Use music or television while you are exercising so that the exercises are a pleasant break in your day. This will make your life  better with the exercises acting as a break in your routine that you can look forward to.   Perform all exercises about fifteen times, three times per day or as directed.  You should exercise both the operative leg and the other leg as well.  Exercises include:    Quad Sets - Tighten up the muscle on the front of the thigh (Quad) and hold for 5-10 seconds.    Straight Leg Raises - With your knee straight (if you were given a brace, keep it on), lift the leg to 60 degrees, hold for 3 seconds, and slowly lower the leg.  Perform this exercise against resistance later as your leg gets stronger.   Leg Slides: Lying on your back, slowly slide your foot toward your buttocks, bending your knee up off the floor (only go as far as is comfortable). Then slowly slide your foot back down until your leg is flat on the floor again.   Angel Wings: Lying on your back spread your legs to the side as far apart as you can without causing discomfort.   Hamstring Strength:  Lying on your back, push your heel against the floor with your leg straight by tightening up the muscles of your buttocks.  Repeat, but this time bend your knee to a comfortable angle, and push your heel against the floor.  You may put a pillow under the heel to make it more comfortable if necessary.   A rehabilitation program following joint replacement surgery can speed recovery and prevent re-injury in the future due to weakened muscles. Contact your doctor or a physical therapist for more information on knee rehabilitation.    CONSTIPATION  Constipation is defined medically as fewer than three stools per week and severe constipation as less than one stool per week.  Even if you have a regular bowel pattern at home, your normal regimen is likely to be disrupted due to multiple reasons following surgery.  Combination of anesthesia, postoperative narcotics, change in appetite and fluid intake all can affect your bowels.   YOU MUST use at least  one of the following options; they are listed in order of increasing strength to get the job done.  They are all available over the counter, and you may need to use some, POSSIBLY even all of these options:    Drink plenty of fluids (prune juice may be helpful) and high fiber foods Colace 100 mg by mouth twice a day  Senokot for constipation as directed and as needed Dulcolax (bisacodyl), take with full glass of water  Miralax (polyethylene glycol) once or twice a day as needed.  If you have tried all these things and are unable to have a bowel movement in the first 3-4 days after surgery call either your surgeon or your primary doctor.    If you experience loose stools or diarrhea, hold the medications until you stool forms back up.  If your symptoms do not get better within 1 week or if they get worse, check with your doctor.  If you experience "the worst abdominal pain ever" or develop nausea or vomiting, please contact the office immediately for further recommendations for treatment.   ITCHING:  If you experience itching with your medications, try taking only a single pain pill, or even half a pain pill at a time.  You can also use Benadryl over the counter for itching or also to help with sleep.   TED HOSE STOCKINGS:  Use stockings on both legs until for at least 2 weeks or as directed by physician office. They may be removed at night for sleeping.  MEDICATIONS:  See your medication summary on the After Visit Summary that nursing will review with you.  You may have some home medications which will be placed on hold until you complete the course of blood thinner medication.  It is important for you to complete the blood thinner medication as prescribed.  PRECAUTIONS:  If you experience chest pain or shortness of breath - call 911 immediately for transfer to the hospital emergency department.   If you develop a fever greater that 101 F, purulent drainage from wound, increased redness or  drainage from wound, foul odor from the wound/dressing, or calf pain - CONTACT YOUR SURGEON.                                                   FOLLOW-UP APPOINTMENTS:  If you do not already have a post-op  appointment, please call the office for an appointment to be seen by your surgeon.  Guidelines for how soon to be seen are listed in your After Visit Summary, but are typically between 1-4 weeks after surgery.  OTHER INSTRUCTIONS:   Knee Replacement:  Do not place pillow under knee, focus on keeping the knee straight while resting.   MAKE SURE YOU:   Understand these instructions.   Get help right away if you are not doing well or get worse.    Thank you for letting us be a part of your medical care team.  It is a privilege we respect greatly.  We hope these instructions will help you stay on track for a fast and full recovery!

## 2017-11-24 NOTE — Anesthesia Procedure Notes (Deleted)
Spinal  Patient location during procedure: OR Start time: 11/24/2017 7:38 AM End time: 11/24/2017 7:43 AM Reason for block: at surgeon's request Staffing Resident/CRNA: Anne Fu, CRNA Performed: resident/CRNA  Preanesthetic Checklist Completed: patient identified, site marked, surgical consent, pre-op evaluation, timeout performed, IV checked, risks and benefits discussed and monitors and equipment checked Spinal Block Patient position: sitting Prep: DuraPrep Patient monitoring: heart rate, continuous pulse ox and blood pressure Approach: midline Location: L2-3 Injection technique: single-shot Needle Needle type: Spinocan  Needle gauge: 22 G Needle length: 9 cm Assessment Sensory level: T6 Additional Notes Expiration date of kit checked and confirmed. Patient tolerated procedure well, without complications. X 2 attempt (first attempt right paramedian 24g without success) with noted clear CSF return. Loss of motor and sensory on exam post injection.

## 2017-11-24 NOTE — Anesthesia Postprocedure Evaluation (Signed)
Anesthesia Post Note  Patient: MILFRED KRAMMES  Procedure(s) Performed: RIGHT KNEE OPEN ARTHROTOMY WITH SCAR DEBRIDEMENT AND POSSIBLE POLY EXCHANGE (Right Knee) LEFT KNEE CORTISONE INJECTION (Left Knee)     Patient location during evaluation: PACU Anesthesia Type: MAC Level of consciousness: awake and alert Pain management: pain level controlled Vital Signs Assessment: post-procedure vital signs reviewed and stable Respiratory status: spontaneous breathing, nonlabored ventilation, respiratory function stable and patient connected to nasal cannula oxygen Cardiovascular status: stable and blood pressure returned to baseline Postop Assessment: no apparent nausea or vomiting Anesthetic complications: no    Last Vitals:  Vitals:   11/24/17 0915 11/24/17 0930  BP: 112/69 114/67  Pulse: (!) 50 (!) 52  Resp: 12 12  Temp:    SpO2: 100% 100%    Last Pain:  Vitals:   11/24/17 0930  TempSrc:   PainSc: 0-No pain                 Danilynn Jemison

## 2017-11-24 NOTE — Op Note (Signed)
NAME: Stuart Flores, Stuart Flores MEDICAL RECORD ZO:[id number] ACCOUNT [id number] DATE OF BIRTH:[birth year] FACILITY: [facility] LOCATION: [facility] PHYSICIAN:Winifred Bodiford DCharlann Boxer, MD  OPERATIVE REPORT  DATE OF PROCEDURE:  11/24/2017  PREOPERATIVE DIAGNOSES: 1.  Painful intraarticular scarring, patellar clunk following right total knee replacement approximately 2 years ago. 2.  Left knee arthritis and pain.  POSTOPERATIVE DIAGNOSES:   1.  Painful intraarticular scarring, patellar clunk following right total knee replacement approximately 2 years ago. 2.  Left knee arthritis and pain.  PROCEDURE: 1.  Right knee open arthrotomy and scar excision.  This involved sharp dissection and excision with a scalpel of an 8-inch incision, subcutaneous tissue as well as intraarticular scarring. 2.  Revision polyethylene to a size 4 x 12.5 mm posterior stabilized insert. 3.  Left knee intraarticular injection with Depo-Medrol and Marcaine.  SURGEON:  Stuart Romans, MD  ASSISTANT:  Lanney Gins PA-C.  Note that [name] was present for the entirety of the case from preoperative position, perioperative management of the operative extremity, general facilitation of the case and primary wound closure.  ANESTHESIA:  Spinal.  SPECIMENS:  None.  COMPLICATIONS:  None.  FINDINGS:  See the body of the paragraph for findings.  DRAINS:  None.  TOURNIQUET TIME:  Fifteen minutes at 250 mmHg.  INDICATIONS:  The patient is an 84 year old gentleman who presented to the office for a second-opinion evaluation of his right knee following total knee arthroplasty approximately 2 years ago.  He had findings consistent with excessive scarring that was  painful with range of motion and functional limitations associated with this.  We discussed the diagnosis and plan.  He also is having ongoing left knee arthritis and pain, which may ultimately lead to total knee arthroplasty discussion.  We discussed  injecting his left knee to  facilitate postoperative recovery from his right knee surgery.  Risks of infection, recurrence of scar, persistent pain, need for future surgeries all discussed and reviewed.  It was determined preoperatively that there was no  concern for infection and no concern for loosening of his components.  PROCEDURE IN DETAIL:  The patient was brought to the operative theater.  Once adequate anesthesia, preoperative antibiotic Ancef administered as well as tranexamic acid and Decadron, he was positioned supine.  A right thigh tourniquet was placed.  The  right lower extremity was prepped and draped in sterile fashion.  Timeout was performed identifying the patient, the planned procedure and extremity.  His old incision was identified.  It was excised to try to straighten it out a bit more.  Soft tissue  planes created.  Median arthrotomy was then made encountering clear synovial fluid.  Following initial exposure, we identified significant peripatellar scarring that was elevated off the patella by about a centimeter.  I did perform a synovectomy along the medial aspect of the joint as well as in the suprapatellar pouch.  At this point, we focused on the patella.  The patella was inverted and everted with towel clips in place.  I then excised the scar around the patella down to the quadriceps tendon proximally and the patellar tendon inferiorly.  I then performed a little  bit of an internal lateral release of the lateral facet and removed some excessive bone laterally as well as medially.  At this point, the patella articulated through the trochlea without application of pressure with smooth transition without excessive  scar.  Once this was completed and the knee flexed, I removed the old polyethylene insert.  The knee was then  subluxated anteriorly for further debridement of scar posteriorly, and then a final 4 x 12.5 posterior stabilized insert was placed in the knee.   The knee was irrigated with normal  saline solution pulse lavage.  The knee was injected in the synovial capsular junction with 0.25% Marcaine with epinephrine, 1 mL of Toradol and saline.  The knee was then closed in routine fashion with #1 Vicryl and  #1 Stratafix suture on the extensor mechanism, 2-0 Vicryl and a running Monocryl stitch on the skin.  The skin was then cleaned, dried and dressed sterilely with surgical glue and Aquacel dressing.  The tourniquet had been let down after 15 minutes.   There was no significant hemostasis that was required during the closure for the irrigation.  Once the right knee was dressed, the left knee was prepped with Betadine and injected in the superior lateral portal area with Depo-Medrol and Marcaine.  He  tolerated the procedures well and was transferred to the recovery room in stable condition, tolerating the procedure well.  He will be admitted to the hospital for observation overnight for IV antibiotics.  Findings were reviewed with the family and will  be reviewed with him postoperatively.  Pictures taken in the OR to review with him.  LN/NUANCE  D:11/24/2017 T:11/24/2017 JOB:[id number]

## 2017-11-25 ENCOUNTER — Encounter (HOSPITAL_COMMUNITY): Payer: Self-pay | Admitting: Orthopedic Surgery

## 2017-11-25 LAB — BASIC METABOLIC PANEL
Anion gap: 5 (ref 5–15)
BUN: 19 mg/dL (ref 8–23)
CHLORIDE: 108 mmol/L (ref 98–111)
CO2: 27 mmol/L (ref 22–32)
CREATININE: 0.92 mg/dL (ref 0.61–1.24)
Calcium: 8.8 mg/dL — ABNORMAL LOW (ref 8.9–10.3)
GFR calc Af Amer: >60 mL/min (ref >60)
GFR calc non Af Amer: >60 mL/min (ref >60)
GLUCOSE: 142 mg/dL — AB (ref 70–99)
Potassium: 4.9 mmol/L (ref 3.5–5.1)
SODIUM: 140 mmol/L (ref 135–145)

## 2017-11-25 LAB — CBC
HCT: 33.7 % — ABNORMAL LOW (ref 39.0–52.0)
HEMOGLOBIN: 11.3 g/dL — AB (ref 13.0–17.0)
MCH: 30.9 pg (ref 26.0–34.0)
MCHC: 33.5 g/dL (ref 30.0–36.0)
MCV: 92.1 fL (ref 78.0–100.0)
Platelets: 234 10*3/uL (ref 150–400)
RBC: 3.66 MIL/uL — ABNORMAL LOW (ref 4.22–5.81)
RDW: 14.2 % (ref 11.5–15.5)
WBC: 12 10*3/uL — ABNORMAL HIGH (ref 4.0–10.5)

## 2017-11-25 NOTE — Progress Notes (Signed)
Physical Therapy Treatment Patient Details Name: Stuart Flores MRN: [id number] DOB: 01/02/1934 Today's Date: 11/25/2017    History of Present Illness 84 YO male s/p R TKR revision (open arthrotomy with scar debridement and poly exchange) on 11/24/17. PMH includes squamous cell carcinoma, hyperlipidemia, glaucoma (resolved), CAD, bradycardia, OA. Surgical history includes R TKR, knee arthroscopy, L hand surgery, coronary angioplasty with stent placement in 2016, cardiac cath 2016, cataracts extraction in 2015.    PT Comments    POD # 1 am session Assisted with amb a greater distance in hallway then returned to bed to perform all supine TE's following HEP handout.  Instructed on proper tech, freq as well as use of ICE. Pt will need another PT session to address stairs.   Follow Up Recommendations  Follow surgeon's recommendation for DC plan and follow-up therapies;Supervision for mobility/OOB     Equipment Recommendations  None recommended by PT    Recommendations for Other Services       Precautions / Restrictions Precautions Precautions: Knee;Fall Restrictions Weight Bearing Restrictions: No Other Position/Activity Restrictions: WBAT     Mobility  Bed Mobility Overal bed mobility: Needs Assistance Bed Mobility: Supine to Sit;Sit to Supine     Supine to sit: Supervision Sit to supine: Supervision   General bed mobility comments: increased time  Transfers Overall transfer level: Needs assistance Equipment used: Rolling walker (2 wheeled) Transfers: Sit to/from Stand Sit to Stand: Supervision;Min guard         General transfer comment: 25% VC's safety with turns using walker   Ambulation/Gait Ambulation/Gait assistance: Supervision Gait Distance (Feet): 145 Feet Assistive device: Rolling walker (2 wheeled) Gait Pattern/deviations: Step-to pattern;Decreased stride length;Trunk flexed;Antalgic;Decreased weight shift to right Gait velocity: decreased     General Gait Details: <25% VC's on safety with turns and proper walker to self distance    Stairs             Wheelchair Mobility    Modified Rankin (Stroke Patients Only)       Balance                                            Cognition Arousal/Alertness: Awake/alert Behavior During Therapy: WFL for tasks assessed/performed Overall Cognitive Status: Within Functional Limits for tasks assessed                                        Exercises   Total Knee Replacement TE's 10 reps B LE ankle pumps 10 reps towel squeezes 10 reps knee presses 10 reps heel slides  10 reps SAQ's 10 reps SLR's 10 reps ABD Followed by ICE     General Comments        Pertinent Vitals/Pain Pain Assessment: 0-10 Pain Score: 2  Pain Location: R knee  Pain Descriptors / Indicators: Grimacing;Operative site guarding Pain Intervention(s): Monitored during session;Repositioned;Ice applied    Home Living                      Prior Function            PT Goals (current goals can now be found in the care plan section) Progress towards PT goals: Progressing toward goals    Frequency    7X/week      PT  Plan Current plan remains appropriate    Co-evaluation              AM-PAC PT "6 Clicks" Daily Activity  Outcome Measure  Difficulty turning over in bed (including adjusting bedclothes, sheets and blankets)?: A Lot Difficulty moving from lying on back to sitting on the side of the bed? : A Lot Difficulty sitting down on and standing up from a chair with arms (e.g., wheelchair, bedside commode, etc,.)?: A Lot Help needed moving to and from a bed to chair (including a wheelchair)?: A Lot Help needed walking in hospital room?: A Lot Help needed climbing 3-5 steps with a railing? : A Lot 6 Click Score: 12    End of Session Equipment Utilized During Treatment: Gait belt Activity Tolerance: Patient tolerated treatment well;No  increased pain Patient left: in bed;with call bell/phone within reach;with family/visitor present Nurse Communication: Mobility status PT Visit Diagnosis: Other abnormalities of gait and mobility (R26.89);Difficulty in walking, not elsewhere classified (R26.2)     Time: 4098-11910900-0940 PT Time Calculation (min) (ACUTE ONLY): 40 min  Charges:  $Gait Training: 8-22 mins $Therapeutic Exercise: 8-22 mins $Therapeutic Activity: 8-22 mins                     Felecia ShellingLori Terrilee Dudzik  PTA [facility]  Acute  Rehab Pager      [phone]

## 2017-11-25 NOTE — Progress Notes (Signed)
Physical Therapy Treatment Patient Details Name: Stuart HazyKenneth W Bouchie MRN: [id number] DOB: 11-Mar-1934 Today's Date: 11/25/2017    History of Present Illness 84 YO male s/p R TKR revision (open arthrotomy with scar debridement and poly exchange) on 11/24/17. PMH includes squamous cell carcinoma, hyperlipidemia, glaucoma (resolved), CAD, bradycardia, OA. Surgical history includes R TKR, knee arthroscopy, L hand surgery, coronary angioplasty with stent placement in 2016, cardiac cath 2016, cataracts extraction in 2015.    PT Comments    Assisted with amb again and practiced stairs.  Pt ready for D/C to home.    Follow Up Recommendations  Follow surgeon's recommendation for DC plan and follow-up therapies;Supervision for mobility/OOB     Equipment Recommendations  None recommended by PT    Recommendations for Other Services       Precautions / Restrictions Precautions Precautions: Knee;Fall Restrictions Weight Bearing Restrictions: No Other Position/Activity Restrictions: WBAT     Mobility  Bed Mobility Overal bed mobility: Needs Assistance Bed Mobility: Supine to Sit;Sit to Supine     Supine to sit: Supervision Sit to supine: Supervision   General bed mobility comments: increased time  Transfers Overall transfer level: Needs assistance Equipment used: Rolling walker (2 wheeled) Transfers: Sit to/from Stand Sit to Stand: Supervision;Min guard         General transfer comment: 25% VC's safety with turns using walker   Ambulation/Gait Ambulation/Gait assistance: Supervision Gait Distance (Feet): 145 Feet Assistive device: Rolling walker (2 wheeled) Gait Pattern/deviations: Step-to pattern;Decreased stride length;Trunk flexed;Antalgic;Decreased weight shift to right Gait velocity: decreased    General Gait Details: <25% VC's on safety with turns and proper walker to self distance    Stairs Stairs: Yes Stairs assistance: Supervision;Min guard Stair Management: Two  rails;Step to pattern;Forwards Number of Stairs: 3 General stair comments: 25% VC's proper tech and sequencing/safety   Wheelchair Mobility    Modified Rankin (Stroke Patients Only)       Balance                                            Cognition Arousal/Alertness: Awake/alert Behavior During Therapy: WFL for tasks assessed/performed Overall Cognitive Status: Within Functional Limits for tasks assessed                                        Exercises      General Comments        Pertinent Vitals/Pain Pain Assessment: 0-10 Pain Score: 2  Pain Location: R knee  Pain Descriptors / Indicators: Grimacing;Operative site guarding Pain Intervention(s): Monitored during session;Repositioned;Ice applied    Home Living                      Prior Function            PT Goals (current goals can now be found in the care plan section) Progress towards PT goals: Progressing toward goals    Frequency    7X/week      PT Plan Current plan remains appropriate    Co-evaluation              AM-PAC PT "6 Clicks" Daily Activity  Outcome Measure  Difficulty turning over in bed (including adjusting bedclothes, sheets and blankets)?: A Lot Difficulty moving from lying on back to  sitting on the side of the bed? : A Lot Difficulty sitting down on and standing up from a chair with arms (e.g., wheelchair, bedside commode, etc,.)?: A Lot Help needed moving to and from a bed to chair (including a wheelchair)?: A Lot Help needed walking in hospital room?: A Lot Help needed climbing 3-5 steps with a railing? : A Lot 6 Click Score: 12    End of Session Equipment Utilized During Treatment: Gait belt Activity Tolerance: Patient tolerated treatment well;No increased pain Patient left: in bed;with call bell/phone within reach;with family/visitor present Nurse Communication: Mobility status PT Visit Diagnosis: Other abnormalities of  gait and mobility (R26.89);Difficulty in walking, not elsewhere classified (R26.2)     Time: 4098-1191 PT Time Calculation (min) (ACUTE ONLY): 24 min  Charges:  $Gait Training: 8-22 mins $Therapeutic Activity: 8-22 mins                     Felecia Shelling  PTA [facility]  Acute  Rehab Pager      [phone]

## 2017-11-25 NOTE — Progress Notes (Signed)
Patient ID: Stuart HazyKenneth W Weaver, male   DOB: 03-06-1934, 84 y.o.   MRN: [id number] Subjective: 1 Day Post-Op Procedure(s) (LRB): RIGHT KNEE OPEN ARTHROTOMY WITH SCAR DEBRIDEMENT AND POSSIBLE POLY EXCHANGE (Right) LEFT KNEE CORTISONE INJECTION (Left)    Patient reports pain as mild.  Doing well, no real pain as of yet. Up with therapy yesterday.  Reviewed findings  Objective:   VITALS:   Vitals:   11/25/17 0113 11/25/17 0647  BP: 100/65 92/66  Pulse: (!) 44 (!) 42  Resp: 14 16  Temp: 97.7 F (36.5 C) 97.9 F (36.6 C)  SpO2: 100% 100%    Neurovascular intact Incision: dressing C/D/I  LABS Recent Labs    11/25/17 0506  HGB 11.3*  HCT 33.7*  WBC 12.0*  PLT 234    Recent Labs    11/25/17 0506  NA 140  K 4.9  BUN 19  CREATININE 0.92  GLUCOSE 142*    No results for input(s): LABPT, INR in the last 72 hours.   Assessment/Plan: 1 Day Post-Op Procedure(s) (LRB): RIGHT KNEE OPEN ARTHROTOMY WITH SCAR DEBRIDEMENT AND POSSIBLE POLY EXCHANGE (Right) LEFT KNEE CORTISONE INJECTION (Left)   Advance diet Up with therapy  Home today after therapy RTC in 2 weeks PT at office on Monday

## 2017-11-29 NOTE — Discharge Summary (Signed)
Physician Discharge Summary  Patient ID: Stuart Flores MRN: [id number] DOB/AGE: 82-Aug-1935 82 y.o.  Admit date: 11/24/2017 Discharge date: 11/25/2017   Procedures:  Procedure(s) (LRB): RIGHT KNEE OPEN ARTHROTOMY WITH SCAR DEBRIDEMENT AND POSSIBLE POLY EXCHANGE (Right) LEFT KNEE CORTISONE INJECTION (Left)  Attending Physician:  Dr. Durene Romans   Admission Diagnoses:   Right total knee with painful scar  Discharge Diagnoses:  Active Problems:   S/P revision of total knee, right  Past Medical History:  Diagnosis Date  . Arthritis    ostearthritis- back, knee  . Bradycardia   . Coronary artery disease   . Glaucoma    not a problem now  . Hyperlipemia   . Squamous carcinoma     HPI:    Stuart Flores is a 84 y.o. male complaining of right knee pain for < 2 years. Pain had continually increased since the beginning.  Hx of a right TKA in 2017 per Dr. Lequita Halt.   X-rays in the clinic show previous right TKA. Stuart Flores has tried various conservative treatments which have failed to alleviate their symptoms. Various options are discussed with the patient. Risks, benefits and expectations were discussed with the patient. Patient understand the risks, benefits and expectations and wishes to proceed with surgery.   PCP: Chilton Greathouse, MD   Discharged Condition: good  Hospital Course:  Patient underwent the above stated procedure on 11/24/2017. Patient tolerated the procedure well and brought to the recovery room in good condition and subsequently to the floor.  POD #1 BP: 92/66 ; Pulse: 42 ; Temp: 97.9 F (36.6 C) ; Resp: 16 Patient reports pain as mild.  Doing well, no real pain as of yet. Up with therapy yesterday.  Reviewed findings.  Ready to be discharged home.  Dorsiflexion/plantar flexion intact, incision: dressing C/D/I, no cellulitis present and compartment soft. Patient reports pain Neurovascular intact and incision: dressing C/D/I.   LABS  Basename    HGB     11.3  HCT     33.7     Discharge Exam: General appearance: alert, cooperative and no distress Extremities: Homans sign is negative, no sign of DVT, no edema, redness or tenderness in the calves or thighs and no ulcers, gangrene or trophic changes  Disposition:  Home with follow up in 2 weeks   Follow-up Information    Durene Romans, MD. Schedule an appointment as soon as possible for a visit in 2 weeks.   Specialty:  Orthopedic Surgery Contact information: [address] [place] [address] [phone]           Discharge Instructions    Call MD / Call 911   Complete by:  As directed    If you experience chest pain or shortness of breath, CALL 911 and be transported to the hospital emergency room.  If you develope a fever above 101 F, pus (white drainage) or increased drainage or redness at the wound, or calf pain, call your surgeon's office.   Change dressing   Complete by:  As directed    Maintain surgical dressing until follow up in the clinic. If the edges start to pull up, may reinforce with tape. If the dressing is no longer working, may remove and cover with gauze and tape, but must keep the area dry and clean.  Call with any questions or concerns.   Constipation Prevention   Complete by:  As directed    Drink plenty of fluids.  Prune juice may be helpful.  You  may use a stool softener, such as Colace (over the counter) 100 mg twice a day.  Use MiraLax (over the counter) for constipation as needed.   Diet - low sodium heart healthy   Complete by:  As directed    Discharge instructions   Complete by:  As directed    Maintain surgical dressing until follow up in the clinic. If the edges start to pull up, may reinforce with tape. If the dressing is no longer working, may remove and cover with gauze and tape, but must keep the area dry and clean.  [facility] Call with any questions or concerns.   Increase activity slowly as tolerated    Complete by:  As directed    Weight bearing as tolerated with assist device (walker, cane, etc) as directed, use it as long as suggested by your surgeon or therapist, typically at least 4-6 weeks.   TED hose   Complete by:  As directed    Use stockings (TED hose) for 2 weeks on both leg(s).  You may remove them at night for sleeping.      Allergies as of 11/25/2017      Reactions   Oxycontin [oxycodone Hcl] Other (See Comments)   Behavioral changes      Medication List    STOP taking these medications   acetaminophen 500 MG tablet Commonly known as:  TYLENOL     TAKE these medications   apixaban 5 MG Tabs tablet Commonly known as:  ELIQUIS Take 1 tablet (5 mg total) by mouth 2 (two) times daily.   atorvastatin 40 MG tablet Commonly known as:  LIPITOR Take 40 mg by mouth daily. In the morning   docusate sodium 100 MG capsule Commonly known as:  COLACE Take 1 capsule (100 mg total) by mouth 2 (two) times daily.   ferrous sulfate 325 (65 FE) MG tablet Take 1 tablet (325 mg total) by mouth 3 (three) times daily with meals.   HYDROcodone-acetaminophen 5-325 MG tablet Commonly known as:  NORCO/VICODIN Take 1-2 tablets by mouth every 4 (four) hours as needed for moderate pain.   methocarbamol 500 MG tablet Commonly known as:  ROBAXIN Take 1 tablet (500 mg total) by mouth every 6 (six) hours as needed for muscle spasms.   metoprolol succinate 25 MG 24 hr tablet Commonly known as:  TOPROL-XL Take 12.5 mg by mouth daily with breakfast.   multivitamin with minerals Tabs tablet Take 1 tablet by mouth daily.   nitroGLYCERIN 0.4 MG SL tablet Commonly known as:  NITROSTAT Place 0.4 mg under the tongue every 5 (five) minutes as needed for chest pain (No more than 3 doses).   polyethylene glycol packet Commonly known as:  MIRALAX / GLYCOLAX Take 17 g by mouth 2 (two) times daily.   PROBIOTIC DAILY PO Take 1 capsule by mouth daily.            Discharge Care  Instructions  (From admission, onward)         Start     Ordered   11/24/17 0000  Change dressing    Comments:  Maintain surgical dressing until follow up in the clinic. If the edges start to pull up, may reinforce with tape. If the dressing is no longer working, may remove and cover with gauze and tape, but must keep the area dry and clean.  Call with any questions or concerns.   11/24/17 2144  Signed: Anastasio Auerbach. Damarkus Balis   PA-C  11/29/2017, 9:48 AM

## 2018-02-09 NOTE — H&P (Signed)
TOTAL KNEE ADMISSION H&P  Patient is being admitted for left total knee arthroplasty.  Subjective:  Chief Complaint:   Left knee primary OA / pain  HPI: Stuart Flores, 84 y.o. male, has a history of pain and functional disability in the left knee due to arthritis and has failed non-surgical conservative treatments for greater than 12 weeks to include NSAID's and/or analgesics, corticosteriod injections and activity modification.  Onset of symptoms was gradual, starting 2+ years ago with gradually worsening course since that time. The patient noted prior procedures on the knee to include  arthroscopy on the left knee(s).  Patient currently rates pain in the left knee(s) at 5 out of 10 with activity. Patient has worsening of pain with activity and weight bearing, pain that interferes with activities of daily living, pain with passive range of motion, crepitus and joint swelling.  Patient has evidence of periarticular osteophytes and joint space narrowing by imaging studies.  There is no active infection.  Risks, benefits and expectations were discussed with the patient.  Risks including but not limited to the risk of anesthesia, blood clots, nerve damage, blood vessel damage, failure of the prosthesis, infection and up to and including death.  Patient understand the risks, benefits and expectations and wishes to proceed with surgery.   PCP: Chilton Greathouse, MD  D/C Plans:       Home   Post-op Meds:       No Rx given   Tranexamic Acid:      To be given - IV   Decadron:      Is to be given  FYI:      ASA  Norco  DME:   Pt already has equipment   PT:   OPPT    Patient Active Problem List   Diagnosis Date Noted  . 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 OA (osteoarthritis) of knee 10/27/2015  . Post PTCA 03/04/2015  . Angina effort 03/03/2015  . Hyperlipidemia 03/03/2015   Past Medical History:  Diagnosis Date  . Arthritis    ostearthritis- back, knee  . Bradycardia   .  Coronary artery disease   . Glaucoma    not a problem now  . Hyperlipemia   . Squamous carcinoma     Past Surgical History:  Procedure Laterality Date  . CARDIAC CATHETERIZATION N/A 03/04/2015   Procedure: Left Heart Cath and Coronary Angiography;  Surgeon: Yates Decamp, MD;  Location: [facility] INVASIVE CV LAB;  Service: Cardiovascular;  Laterality: N/A;  . CARDIAC CATHETERIZATION  03/04/2015   Procedure: Coronary Stent Intervention;  Surgeon: Yates Decamp, MD;  Location: [facility] INVASIVE CV LAB;  Service: Cardiovascular;;  . CATARACT EXTRACTION W/ INTRAOCULAR LENS  IMPLANT, BILATERAL Bilateral 2015  . CORONARY ANGIOPLASTY WITH STENT PLACEMENT  03/03/2015   "2 stents"  . HAND SURGERY Left    "thumb surgery for Tendon pair"  . KNEE ARTHROSCOPY Bilateral   . SQUAMOUS CELL CARCINOMA EXCISION  X 2  . STERIOD INJECTION Left 11/24/2017   Procedure: LEFT KNEE CORTISONE INJECTION;  Surgeon: Durene Romans, MD;  Location: [facility] ORS;  Service: Orthopedics;  Laterality: Left;  . TONSILLECTOMY    . TOTAL KNEE ARTHROPLASTY Right 10/27/2015   Procedure: TOTAL RIGHT KNEE ARTHROPLASTY;  Surgeon: Ollen Gross, MD;  Location: [facility] ORS;  Service: Orthopedics;  Laterality: Right;  . TOTAL KNEE REVISION WITH SCAR DEBRIDEMENT/PATELLA REVISION WITH POLY EXCHANGE Right 11/24/2017   Procedure: RIGHT KNEE OPEN ARTHROTOMY WITH SCAR DEBRIDEMENT AND POSSIBLE POLY EXCHANGE;  Surgeon: Durene Romans, MD;  Location: [facility] ORS;  Service: Orthopedics;  Laterality: Right;  90 mins    No current facility-administered medications for this encounter.    Current Outpatient Medications  Medication Sig Dispense Refill Last Dose  . apixaban (ELIQUIS) 5 MG TABS tablet Take 1 tablet (5 mg total) by mouth 2 (two) times daily. (Patient taking differently: Take 5 mg by mouth 2 (two) times daily. ) 60 tablet 0 11/21/2017  . atorvastatin (LIPITOR) 40 MG tablet Take 40 mg by mouth daily. In the morning   11/24/2017 at Unknown time  . docusate sodium (COLACE) 100  MG capsule Take 1 capsule (100 mg total) by mouth 2 (two) times daily. 10 capsule 0   . ferrous sulfate (FERROUSUL) 325 (65 FE) MG tablet Take 1 tablet (325 mg total) by mouth 3 (three) times daily with meals.  3   . HYDROcodone-acetaminophen (NORCO/VICODIN) 5-325 MG tablet Take 1-2 tablets by mouth every 4 (four) hours as needed for moderate pain. 60 tablet 0   . methocarbamol (ROBAXIN) 500 MG tablet Take 1 tablet (500 mg total) by mouth every 6 (six) hours as needed for muscle spasms. 40 tablet 0   . metoprolol succinate (TOPROL-XL) 25 MG 24 hr tablet Take 12.5 mg by mouth daily with breakfast.    11/24/2017 at 0400  . Multiple Vitamin (MULTIVITAMIN WITH MINERALS) TABS tablet Take 1 tablet by mouth daily.   Past Week at Unknown time  . nitroGLYCERIN (NITROSTAT) 0.4 MG SL tablet Place 0.4 mg under the tongue every 5 (five) minutes as needed for chest pain (No more than 3 doses).    Unknown at Unknown time  . polyethylene glycol (MIRALAX / GLYCOLAX) packet Take 17 g by mouth 2 (two) times daily. 14 each 0   . Probiotic Product (PROBIOTIC DAILY PO) Take 1 capsule by mouth daily.   Unknown at Unknown time   Allergies  Allergen Reactions  . Oxycontin [Oxycodone Hcl] Other (See Comments)    Behavioral changes    Social History   Tobacco Use  . Smoking status: Former Smoker    Packs/day: 1.00    Years: 9.00    Pack years: 9.00    Types: Cigarettes  . Smokeless tobacco: Never Used  . Tobacco comment: stopped smoking cigarettes at age 18  Substance Use Topics  . Alcohol use: Yes    Alcohol/week: 9.0 standard drinks    Types: 9 Glasses of wine per week    Comment: weekly       Review of Systems  Constitutional: Negative.   HENT: Negative.   Eyes: Negative.   Respiratory: Negative.   Cardiovascular: Negative.   Gastrointestinal: Negative.   Genitourinary: Positive for frequency.  Musculoskeletal: Positive for joint pain.  Skin: Negative.   Neurological: Negative.    Endo/Heme/Allergies: Negative.   Psychiatric/Behavioral: Negative.     Objective:  Physical Exam  Constitutional: He is oriented to person, place, and time. He appears well-developed.  HENT:  Head: Normocephalic.  Eyes: Pupils are equal, round, and reactive to light.  Neck: Neck supple. No JVD present. No tracheal deviation present. No thyromegaly present.  Cardiovascular: Normal rate, regular rhythm and intact distal pulses.  Respiratory: Effort normal and breath sounds normal. No respiratory distress. He has no wheezes.  GI: Soft. There is no tenderness. There is no guarding.  Musculoskeletal:       Left knee: He exhibits swelling and bony tenderness. He exhibits no ecchymosis, no deformity, no laceration and no erythema.  Tenderness found.  Lymphadenopathy:    He has no cervical adenopathy.  Neurological: He is alert and oriented to person, place, and time.  Skin: Skin is warm and dry.  Psychiatric: He has a normal mood and affect.     Labs:  Estimated body mass index is 22.8 kg/m as calculated from the following:   Height as of 11/24/17: 6' (1.829 m).   Weight as of 11/24/17: 76.3 kg.   Imaging Review Plain radiographs demonstrate severe degenerative joint disease of the left knee(s). The bone quality appears to be good for age and reported activity level.   Preoperative templating of the joint replacement has been completed, documented, and submitted to the Operating Room personnel in order to optimize intra-operative equipment management.   Anticipated LOS equal to or greater than 2 midnights due to - Age 27 and older with one or more of the following:  - Expected need for hospital services (PT, OT, Nursing) required for safe discharge    Assessment/Plan:  End stage arthritis, left knee   The patient history, physical examination, clinical judgment of the provider and imaging studies are consistent with end stage degenerative joint disease of the left knee(s)  and total knee arthroplasty is deemed medically necessary. The treatment options including medical management, injection therapy arthroscopy and arthroplasty were discussed at length. The risks and benefits of total knee arthroplasty were presented and reviewed. The risks due to aseptic loosening, infection, stiffness, patella tracking problems, thromboembolic complications and other imponderables were discussed. The patient acknowledged the explanation, agreed to proceed with the plan and consent was signed. Patient is being admitted for inpatient treatment for surgery, pain control, PT, OT, prophylactic antibiotics, VTE prophylaxis, progressive ambulation and ADL's and discharge planning. The patient is planning to be discharged home.    Anastasio Auerbach Regina Ganci   PA-C  02/09/2018, 8:36 AM

## 2018-02-22 ENCOUNTER — Encounter (HOSPITAL_COMMUNITY)
Admission: RE | Admit: 2018-02-22 | Discharge: 2018-02-22 | Disposition: A | Discharge status: Home / Self Care | Payer: Medicare Other | Source: Ambulatory Visit | Attending: Orthopedic Surgery | Admitting: Orthopedic Surgery

## 2018-02-22 ENCOUNTER — Other Ambulatory Visit: Payer: Self-pay

## 2018-02-22 ENCOUNTER — Encounter (HOSPITAL_COMMUNITY): Payer: Self-pay

## 2018-02-22 DIAGNOSIS — M1712 Unilateral primary osteoarthritis, left knee: Secondary | ICD-10-CM | POA: Diagnosis not present

## 2018-02-22 DIAGNOSIS — Z01818 Encounter for other preprocedural examination: Secondary | ICD-10-CM | POA: Diagnosis not present

## 2018-02-22 HISTORY — DX: Benign prostatic hyperplasia with lower urinary tract symptoms: N40.1

## 2018-02-22 HISTORY — DX: Personal history of other diseases of the nervous system and sense organs: Z86.69

## 2018-02-22 HISTORY — DX: Presence of coronary angioplasty implant and graft: Z95.5

## 2018-02-22 HISTORY — DX: Paroxysmal atrial fibrillation: I48.0

## 2018-02-22 HISTORY — DX: Other specified postprocedural states: Z98.890

## 2018-02-22 HISTORY — DX: Presence of spectacles and contact lenses: Z97.3

## 2018-02-22 HISTORY — DX: Other obstructive and reflux uropathy: N13.8

## 2018-02-22 HISTORY — DX: Other specified postprocedural states: Z85.9

## 2018-02-22 HISTORY — DX: Presence of external hearing-aid: Z97.4

## 2018-02-22 LAB — BASIC METABOLIC PANEL
ANION GAP: 3 — AB (ref 5–15)
BUN: 18 mg/dL (ref 8–23)
CHLORIDE: 108 mmol/L (ref 98–111)
CO2: 28 mmol/L (ref 22–32)
Calcium: 8.8 mg/dL — ABNORMAL LOW (ref 8.9–10.3)
Creatinine, Ser: 0.74 mg/dL (ref 0.61–1.24)
GFR calc Af Amer: >60 mL/min (ref >60)
GFR calc non Af Amer: >60 mL/min (ref >60)
GLUCOSE: 76 mg/dL (ref 70–99)
Potassium: 4.9 mmol/L (ref 3.5–5.1)
Sodium: 139 mmol/L (ref 135–145)

## 2018-02-22 LAB — SURGICAL PCR SCREEN
MRSA, PCR: NEGATIVE
Staphylococcus aureus: NEGATIVE

## 2018-02-22 LAB — CBC
HEMATOCRIT: 35 % — AB (ref 39.0–52.0)
HEMOGLOBIN: 11.3 g/dL — AB (ref 13.0–17.0)
MCH: 31 pg (ref 26.0–34.0)
MCHC: 32.3 g/dL (ref 30.0–36.0)
MCV: 95.9 fL (ref 80.0–100.0)
NRBC: 0 % (ref 0.0–0.2)
PLATELETS: 229 10*3/uL (ref 150–400)
RBC: 3.65 MIL/uL — ABNORMAL LOW (ref 4.22–5.81)
RDW: 14.9 % (ref 11.5–15.5)
WBC: 7.1 10*3/uL (ref 4.0–10.5)

## 2018-02-22 NOTE — Progress Notes (Signed)
Cardiology clearance dated 10/ 2019 from dr Jacinto Halimganji with chart. Also, lov note dated 10-28-2017.  EKG dated 10-28-2017, ECHo dated 01-20-2017 and Nuclear Stress Test dated 12-27-2016 with chart.

## 2018-02-22 NOTE — Patient Instructions (Signed)
Stuart Flores  July 05, 1933    Your procedure is scheduled on:  03-09-2018   Report to [facility] Main  Entrance, Report to admitting at  6:30 AM    Call this number if you have problems the morning of surgery [phone]       Remember: Do not eat food or drink liquids :After Midnight.                                       BRUSH YOUR TEETH MORNING OF SURGERY AND RINSE YOUR MOUTH OUT, NO CHEWING GUM CANDY OR MINTS.        Take these medicines the morning of surgery with A SIP OF WATER:  Alfuzosin, Atorvastatin, Metoprolol                                  You may not have any metal on your body including hair pins and               piercings  Do not wear jewelry, make-up, lotions, powders or perfumes, deodorant              Men may shave face and neck.       Do not bring valuables to the hospital. [facility] IS NOT             RESPONSIBLE   FOR VALUABLES.  Contacts, dentures or bridgework may not be worn into surgery.  Leave suitcase in the car. After surgery it may be brought to your room.      _____________________________________________________________________             [facility] - Preparing for Surgery Before surgery, you can play an important role.  Because skin is not sterile, your skin needs to be as free of germs as possible.  You can reduce the number of germs on your skin by washing with CHG (chlorahexidine gluconate) soap before surgery.  CHG is an antiseptic cleaner which kills germs and bonds with the skin to continue killing germs even after washing. Please DO NOT use if you have an allergy to CHG or antibacterial soaps.  If your skin becomes reddened/irritated stop using the CHG and inform your nurse when you arrive at Short Stay. Do not shave (including legs and underarms) for at least 48 hours prior to the first CHG shower.  You may shave your face/neck. Please follow these instructions carefully:  1.  Shower with  CHG Soap the night before surgery and the  morning of Surgery.  2.  If you choose to wash your hair, wash your hair first as usual with your  normal  shampoo.  3.  After you shampoo, rinse your hair and body thoroughly to remove the  shampoo.                            4.  Use CHG as you would any other liquid soap.  You can apply chg directly  to the skin and wash                       Gently with a scrungie or clean washcloth.  5.  Apply the  CHG Soap to your body ONLY FROM THE NECK DOWN.   Do not use on face/ open                           Wound or open sores. Avoid contact with eyes, ears mouth and genitals (private parts).                       Wash face,  Genitals (private parts) with your normal soap.             6.  Wash thoroughly, paying special attention to the area where your surgery  will be performed.  7.  Thoroughly rinse your body with warm water from the neck down.  8.  DO NOT shower/wash with your normal soap after using and rinsing off  the CHG Soap.             9.  Pat yourself dry with a clean towel.            10.  Wear clean pajamas.            11.  Place clean sheets on your bed the night of your first shower and do not  sleep with pets. Day of Surgery : Do not apply any lotions/deodorants the morning of surgery.  Please wear clean clothes to the hospital/surgery center.  FAILURE TO FOLLOW THESE INSTRUCTIONS MAY RESULT IN THE CANCELLATION OF YOUR SURGERY PATIENT SIGNATURE_________________________________  NURSE SIGNATURE__________________________________  ________________________________________________________________________   Stuart MireIncentive Spirometer  An incentive spirometer is a tool that can help keep your lungs clear and active. This tool measures how well you are filling your lungs with each breath. Taking long deep breaths may help reverse or decrease the chance of developing breathing (pulmonary) problems (especially infection) following:  A long period of  time when you are unable to move or be active. BEFORE THE PROCEDURE   If the spirometer includes an indicator to show your best effort, your nurse or respiratory therapist will set it to a desired goal.  If possible, sit up straight or lean slightly forward. Try not to slouch.  Hold the incentive spirometer in an upright position. INSTRUCTIONS FOR USE  1. Sit on the edge of your bed if possible, or sit up as far as you can in bed or on a chair. 2. Hold the incentive spirometer in an upright position. 3. Breathe out normally. 4. Place the mouthpiece in your mouth and seal your lips tightly around it. 5. Breathe in slowly and as deeply as possible, raising the piston or the ball toward the top of the column. 6. Hold your breath for 3-5 seconds or for as long as possible. Allow the piston or ball to fall to the bottom of the column. 7. Remove the mouthpiece from your mouth and breathe out normally. 8. Rest for a few seconds and repeat Steps 1 through 7 at least 10 times every 1-2 hours when you are awake. Take your time and take a few normal breaths between deep breaths. 9. The spirometer may include an indicator to show your best effort. Use the indicator as a goal to work toward during each repetition. 10. After each set of 10 deep breaths, practice coughing to be sure your lungs are clear. If you have an incision (the cut made at the time of surgery), support your incision when coughing by placing a pillow or rolled up towels firmly against  it. Once you are able to get out of bed, walk around indoors and cough well. You may stop using the incentive spirometer when instructed by your caregiver.  RISKS AND COMPLICATIONS  Take your time so you do not get dizzy or light-headed.  If you are in pain, you may need to take or ask for pain medication before doing incentive spirometry. It is harder to take a deep breath if you are having pain. AFTER USE  Rest and breathe slowly and easily.  It can  be helpful to keep track of a log of your progress. Your caregiver can provide you with a simple table to help with this. If you are using the spirometer at home, follow these instructions: SEEK MEDICAL CARE IF:   You are having difficultly using the spirometer.  You have trouble using the spirometer as often as instructed.  Your pain medication is not giving enough relief while using the spirometer.  You develop fever of 100.5 F (38.1 C) or higher. SEEK IMMEDIATE MEDICAL CARE IF:   You cough up bloody sputum that had not been present before.  You develop fever of 102 F (38.9 C) or greater.  You develop worsening pain at or near the incision site. MAKE SURE YOU:   Understand these instructions.  Will watch your condition.  Will get help right away if you are not doing well or get worse. Document Released: 08/02/2006 Document Revised: 06/14/2011 Document Reviewed: 10/03/2006 Exit[employer] Patient Information 2014 Exit[employer], [place].   ________________________________________________________________________  WHAT IS A BLOOD TRANSFUSION? Blood Transfusion Information  A transfusion is the replacement of blood or some of its parts. Blood is made up of multiple cells which provide different functions.  Red blood cells carry oxygen and are used for blood loss replacement.  White blood cells fight against infection.  Platelets control bleeding.  Plasma helps clot blood.  Other blood products are available for specialized needs, such as hemophilia or other clotting disorders. BEFORE THE TRANSFUSION  Who gives blood for transfusions?   Healthy volunteers who are fully evaluated to make sure their blood is safe. This is blood bank blood. Transfusion therapy is the safest it has ever been in the practice of medicine. Before blood is taken from a donor, a complete history is taken to make sure that person has no history of diseases nor engages in risky social behavior (examples are  intravenous drug use or sexual activity with multiple partners). The donor's travel history is screened to minimize risk of transmitting infections, such as malaria. The donated blood is tested for signs of infectious diseases, such as HIV and hepatitis. The blood is then tested to be sure it is compatible with you in order to minimize the chance of a transfusion reaction. If you or a relative donates blood, this is often done in anticipation of surgery and is not appropriate for emergency situations. It takes many days to process the donated blood. RISKS AND COMPLICATIONS Although transfusion therapy is very safe and saves many lives, the main dangers of transfusion include:   Getting an infectious disease.  Developing a transfusion reaction. This is an allergic reaction to something in the blood you were given. Every precaution is taken to prevent this. The decision to have a blood transfusion has been considered carefully by your caregiver before blood is given. Blood is not given unless the benefits outweigh the risks. AFTER THE TRANSFUSION  Right after receiving a blood transfusion, you will usually feel much better and more  energetic. This is especially true if your red blood cells have gotten low (anemic). The transfusion raises the level of the red blood cells which carry oxygen, and this usually causes an energy increase.  The nurse administering the transfusion will monitor you carefully for complications. HOME CARE INSTRUCTIONS  No special instructions are needed after a transfusion. You may find your energy is better. Speak with your caregiver about any limitations on activity for underlying diseases you may have. SEEK MEDICAL CARE IF:   Your condition is not improving after your transfusion.  You develop redness or irritation at the intravenous (IV) site. SEEK IMMEDIATE MEDICAL CARE IF:  Any of the following symptoms occur over the next 12 hours:  Shaking chills.  You have a  temperature by mouth above 102 F (38.9 C), not controlled by medicine.  Chest, back, or muscle pain.  People around you feel you are not acting correctly or are confused.  Shortness of breath or difficulty breathing.  Dizziness and fainting.  You get a rash or develop hives.  You have a decrease in urine output.  Your urine turns a dark color or changes to pink, red, or brown. Any of the following symptoms occur over the next 10 days:  You have a temperature by mouth above 102 F (38.9 C), not controlled by medicine.  Shortness of breath.  Weakness after normal activity.  The [place] part of the eye turns yellow (jaundice).  You have a decrease in the amount of urine or are urinating less often.  Your urine turns a dark color or changes to pink, red, or brown. Document Released: 03/19/2000 Document Revised: 06/14/2011 Document Reviewed: 11/06/2007 [facility] Patient Information 2014 [place], [place].  _______________________________________________________________________

## 2018-03-09 ENCOUNTER — Inpatient Hospital Stay (HOSPITAL_COMMUNITY)
Admission: RE | Admit: 2018-03-09 | Discharge: 2018-03-12 | DRG: 470 | Disposition: A | Discharge status: Home Health | Payer: Medicare Other | Attending: Orthopedic Surgery | Admitting: Orthopedic Surgery

## 2018-03-09 ENCOUNTER — Inpatient Hospital Stay (HOSPITAL_COMMUNITY): Payer: Medicare Other | Admitting: Anesthesiology

## 2018-03-09 ENCOUNTER — Encounter (HOSPITAL_COMMUNITY): Payer: Self-pay | Admitting: Anesthesiology

## 2018-03-09 ENCOUNTER — Encounter (HOSPITAL_COMMUNITY)
Admission: RE | Disposition: A | Discharge status: Home Health | Payer: Self-pay | Source: Home / Self Care | Attending: Orthopedic Surgery

## 2018-03-09 ENCOUNTER — Other Ambulatory Visit: Payer: Self-pay

## 2018-03-09 DIAGNOSIS — Z885 Allergy status to narcotic agent status: Secondary | ICD-10-CM | POA: Diagnosis not present

## 2018-03-09 DIAGNOSIS — Z955 Presence of coronary angioplasty implant and graft: Secondary | ICD-10-CM

## 2018-03-09 DIAGNOSIS — Z9842 Cataract extraction status, left eye: Secondary | ICD-10-CM | POA: Diagnosis not present

## 2018-03-09 DIAGNOSIS — Z87891 Personal history of nicotine dependence: Secondary | ICD-10-CM

## 2018-03-09 DIAGNOSIS — Z96651 Presence of right artificial knee joint: Secondary | ICD-10-CM | POA: Diagnosis present

## 2018-03-09 DIAGNOSIS — D649 Anemia, unspecified: Secondary | ICD-10-CM | POA: Diagnosis not present

## 2018-03-09 DIAGNOSIS — Z96659 Presence of unspecified artificial knee joint: Secondary | ICD-10-CM

## 2018-03-09 DIAGNOSIS — I2510 Atherosclerotic heart disease of native coronary artery without angina pectoris: Secondary | ICD-10-CM | POA: Diagnosis present

## 2018-03-09 DIAGNOSIS — Z9841 Cataract extraction status, right eye: Secondary | ICD-10-CM

## 2018-03-09 DIAGNOSIS — Z961 Presence of intraocular lens: Secondary | ICD-10-CM | POA: Diagnosis present

## 2018-03-09 DIAGNOSIS — Z85828 Personal history of other malignant neoplasm of skin: Secondary | ICD-10-CM

## 2018-03-09 DIAGNOSIS — I959 Hypotension, unspecified: Secondary | ICD-10-CM | POA: Diagnosis present

## 2018-03-09 DIAGNOSIS — Z96652 Presence of left artificial knee joint: Secondary | ICD-10-CM

## 2018-03-09 DIAGNOSIS — E785 Hyperlipidemia, unspecified: Secondary | ICD-10-CM | POA: Diagnosis present

## 2018-03-09 DIAGNOSIS — Z7901 Long term (current) use of anticoagulants: Secondary | ICD-10-CM | POA: Diagnosis not present

## 2018-03-09 DIAGNOSIS — M1712 Unilateral primary osteoarthritis, left knee: Principal | ICD-10-CM | POA: Diagnosis present

## 2018-03-09 DIAGNOSIS — I480 Paroxysmal atrial fibrillation: Secondary | ICD-10-CM | POA: Diagnosis present

## 2018-03-09 DIAGNOSIS — M659 Synovitis and tenosynovitis, unspecified: Secondary | ICD-10-CM | POA: Diagnosis present

## 2018-03-09 DIAGNOSIS — R55 Syncope and collapse: Secondary | ICD-10-CM | POA: Diagnosis present

## 2018-03-09 HISTORY — PX: TOTAL KNEE ARTHROPLASTY: SHX125

## 2018-03-09 LAB — TYPE AND SCREEN
ABO/RH(D): O POS
ANTIBODY SCREEN: NEGATIVE

## 2018-03-09 SURGERY — ARTHROPLASTY, KNEE, TOTAL
Anesthesia: Regional | Site: Knee | Laterality: Left

## 2018-03-09 MED ORDER — ONDANSETRON HCL 4 MG/2ML IJ SOLN: 4.0000 mg | Freq: Four times a day (QID) | INTRAMUSCULAR | Status: DC | PRN

## 2018-03-09 MED ORDER — METOCLOPRAMIDE HCL 5 MG PO TABS: 5.0000 mg | ORAL_TABLET | Freq: Three times a day (TID) | ORAL | Status: DC | PRN

## 2018-03-09 MED ORDER — FERROUS SULFATE 325 (65 FE) MG PO TABS
325.0000 mg | ORAL_TABLET | Freq: Two times a day (BID) | ORAL | Status: DC
  Administered 2018-03-10 – 2018-03-12 (×5): 325 mg via ORAL
  Filled 2018-03-09 (×5): qty 1

## 2018-03-09 MED ORDER — CEFAZOLIN SODIUM-DEXTROSE 2-4 GM/100ML-% IV SOLN
2.0000 g | INTRAVENOUS | Status: AC
  Administered 2018-03-09: 2 g via INTRAVENOUS
  Filled 2018-03-09: qty 100

## 2018-03-09 MED ORDER — BISACODYL 10 MG RE SUPP: 10.0000 mg | Freq: Every day | RECTAL | Status: DC | PRN

## 2018-03-09 MED ORDER — CHLORHEXIDINE GLUCONATE 4 % EX LIQD: 60.0000 mL | Freq: Once | CUTANEOUS | Status: DC

## 2018-03-09 MED ORDER — DIPHENHYDRAMINE HCL 12.5 MG/5ML PO ELIX: 12.5000 mg | ORAL_SOLUTION | ORAL | Status: DC | PRN

## 2018-03-09 MED ORDER — BUPIVACAINE-EPINEPHRINE (PF) 0.25% -1:200000 IJ SOLN
INTRAMUSCULAR | Status: AC
  Filled 2018-03-09: qty 30

## 2018-03-09 MED ORDER — TOBRAMYCIN SULFATE 1.2 G IJ SOLR
INTRAMUSCULAR | Status: AC
  Filled 2018-03-09: qty 1.2

## 2018-03-09 MED ORDER — KETOROLAC TROMETHAMINE 30 MG/ML IJ SOLN
INTRAMUSCULAR | Status: AC
  Filled 2018-03-09: qty 1

## 2018-03-09 MED ORDER — EPHEDRINE SULFATE-NACL 50-0.9 MG/10ML-% IV SOSY
PREFILLED_SYRINGE | INTRAVENOUS | Status: DC | PRN
  Administered 2018-03-09 (×2): 10 mg via INTRAVENOUS

## 2018-03-09 MED ORDER — ONDANSETRON HCL 4 MG/2ML IJ SOLN
INTRAMUSCULAR | Status: AC
  Filled 2018-03-09: qty 4

## 2018-03-09 MED ORDER — PROPOFOL 10 MG/ML IV BOLUS
INTRAVENOUS | Status: AC
  Filled 2018-03-09: qty 60

## 2018-03-09 MED ORDER — CLONIDINE HCL (ANALGESIA) 100 MCG/ML EP SOLN
EPIDURAL | Status: DC | PRN
  Administered 2018-03-09: 100 ug

## 2018-03-09 MED ORDER — METHOCARBAMOL 500 MG IVPB - SIMPLE MED
INTRAVENOUS | Status: AC
  Filled 2018-03-09: qty 50

## 2018-03-09 MED ORDER — LACTATED RINGERS IV SOLN
INTRAVENOUS | Status: DC
  Administered 2018-03-09 (×2): via INTRAVENOUS

## 2018-03-09 MED ORDER — DEXAMETHASONE SODIUM PHOSPHATE 10 MG/ML IJ SOLN
10.0000 mg | Freq: Once | INTRAMUSCULAR | Status: AC
  Administered 2018-03-09: 10 mg via INTRAVENOUS

## 2018-03-09 MED ORDER — CELECOXIB 200 MG PO CAPS
200.0000 mg | ORAL_CAPSULE | Freq: Two times a day (BID) | ORAL | Status: DC
  Administered 2018-03-09 – 2018-03-12 (×6): 200 mg via ORAL
  Filled 2018-03-09 (×7): qty 1

## 2018-03-09 MED ORDER — METHOCARBAMOL 500 MG IVPB - SIMPLE MED
500.0000 mg | Freq: Four times a day (QID) | INTRAVENOUS | Status: DC | PRN
  Administered 2018-03-09: 500 mg via INTRAVENOUS
  Filled 2018-03-09: qty 50

## 2018-03-09 MED ORDER — STERILE WATER FOR IRRIGATION IR SOLN
Status: DC | PRN
  Administered 2018-03-09: 2000 mL

## 2018-03-09 MED ORDER — DEXAMETHASONE SODIUM PHOSPHATE 10 MG/ML IJ SOLN
INTRAMUSCULAR | Status: AC
  Filled 2018-03-09: qty 2

## 2018-03-09 MED ORDER — MAGNESIUM CITRATE PO SOLN: 1.0000 | Freq: Once | ORAL | Status: DC | PRN

## 2018-03-09 MED ORDER — SODIUM CHLORIDE (PF) 0.9 % IJ SOLN
INTRAMUSCULAR | Status: DC | PRN
  Administered 2018-03-09: 30 mL

## 2018-03-09 MED ORDER — KETOROLAC TROMETHAMINE 30 MG/ML IJ SOLN
INTRAMUSCULAR | Status: DC | PRN
  Administered 2018-03-09: 30 mg

## 2018-03-09 MED ORDER — NITROGLYCERIN 0.4 MG SL SUBL: 0.4000 mg | SUBLINGUAL_TABLET | SUBLINGUAL | Status: DC | PRN

## 2018-03-09 MED ORDER — METOCLOPRAMIDE HCL 5 MG/ML IJ SOLN: 5.0000 mg | Freq: Three times a day (TID) | INTRAMUSCULAR | Status: DC | PRN

## 2018-03-09 MED ORDER — DEXAMETHASONE SODIUM PHOSPHATE 10 MG/ML IJ SOLN
10.0000 mg | Freq: Once | INTRAMUSCULAR | Status: AC
  Administered 2018-03-10: 10 mg via INTRAVENOUS
  Filled 2018-03-09: qty 1

## 2018-03-09 MED ORDER — SODIUM CHLORIDE 0.9 % IV SOLN
INTRAVENOUS | Status: DC
  Administered 2018-03-09 – 2018-03-10 (×2): via INTRAVENOUS

## 2018-03-09 MED ORDER — HYDROCODONE-ACETAMINOPHEN 5-325 MG PO TABS: 1.0000 | ORAL_TABLET | ORAL | Status: DC | PRN

## 2018-03-09 MED ORDER — MENTHOL 3 MG MT LOZG: 1.0000 | LOZENGE | OROMUCOSAL | Status: DC | PRN

## 2018-03-09 MED ORDER — METHOCARBAMOL 500 MG PO TABS
500.0000 mg | ORAL_TABLET | Freq: Four times a day (QID) | ORAL | Status: DC | PRN
  Administered 2018-03-09 – 2018-03-12 (×3): 500 mg via ORAL
  Filled 2018-03-09 (×3): qty 1

## 2018-03-09 MED ORDER — HYDROMORPHONE HCL 1 MG/ML IJ SOLN: 0.5000 mg | INTRAMUSCULAR | Status: DC | PRN

## 2018-03-09 MED ORDER — PROPOFOL 500 MG/50ML IV EMUL
INTRAVENOUS | Status: DC | PRN
  Administered 2018-03-09: 50 ug/kg/min via INTRAVENOUS

## 2018-03-09 MED ORDER — MIDAZOLAM HCL 2 MG/2ML IJ SOLN
INTRAMUSCULAR | Status: AC
  Filled 2018-03-09: qty 2

## 2018-03-09 MED ORDER — FENTANYL CITRATE (PF) 100 MCG/2ML IJ SOLN
50.0000 ug | INTRAMUSCULAR | Status: DC
  Administered 2018-03-09: 75 ug via INTRAVENOUS
  Administered 2018-03-09: 25 ug via INTRAVENOUS
  Filled 2018-03-09: qty 2

## 2018-03-09 MED ORDER — DOCUSATE SODIUM 100 MG PO CAPS
100.0000 mg | ORAL_CAPSULE | Freq: Two times a day (BID) | ORAL | Status: DC
  Administered 2018-03-09 – 2018-03-12 (×6): 100 mg via ORAL
  Filled 2018-03-09 (×6): qty 1

## 2018-03-09 MED ORDER — HYDROCODONE-ACETAMINOPHEN 7.5-325 MG PO TABS
1.0000 | ORAL_TABLET | ORAL | Status: DC | PRN
  Administered 2018-03-09 – 2018-03-10 (×3): 1 via ORAL
  Administered 2018-03-10: 2 via ORAL
  Administered 2018-03-10 – 2018-03-12 (×6): 1 via ORAL
  Filled 2018-03-09 (×6): qty 1
  Filled 2018-03-09: qty 2
  Filled 2018-03-09 (×3): qty 1
  Filled 2018-03-09: qty 2

## 2018-03-09 MED ORDER — SODIUM CHLORIDE 0.9 % IR SOLN
Status: DC | PRN
  Administered 2018-03-09: 1000 mL

## 2018-03-09 MED ORDER — PROPOFOL 10 MG/ML IV BOLUS
INTRAVENOUS | Status: DC | PRN
  Administered 2018-03-09: 20 mg via INTRAVENOUS

## 2018-03-09 MED ORDER — BUPIVACAINE-EPINEPHRINE (PF) 0.25% -1:200000 IJ SOLN
INTRAMUSCULAR | Status: DC | PRN
  Administered 2018-03-09: 30 mL

## 2018-03-09 MED ORDER — ONDANSETRON HCL 4 MG/2ML IJ SOLN: 4.0000 mg | Freq: Once | INTRAMUSCULAR | Status: DC | PRN

## 2018-03-09 MED ORDER — PHENOL 1.4 % MT LIQD: 1.0000 | OROMUCOSAL | Status: DC | PRN

## 2018-03-09 MED ORDER — FENTANYL CITRATE (PF) 100 MCG/2ML IJ SOLN
INTRAMUSCULAR | Status: AC
  Filled 2018-03-09: qty 2

## 2018-03-09 MED ORDER — EPHEDRINE 5 MG/ML INJ
INTRAVENOUS | Status: AC
  Filled 2018-03-09: qty 10

## 2018-03-09 MED ORDER — FENTANYL CITRATE (PF) 100 MCG/2ML IJ SOLN: 25.0000 ug | INTRAMUSCULAR | Status: DC | PRN

## 2018-03-09 MED ORDER — ALFUZOSIN HCL ER 10 MG PO TB24
10.0000 mg | ORAL_TABLET | Freq: Every day | ORAL | Status: DC
  Administered 2018-03-10 – 2018-03-12 (×3): 10 mg via ORAL
  Filled 2018-03-09 (×3): qty 1

## 2018-03-09 MED ORDER — CEFAZOLIN SODIUM-DEXTROSE 2-4 GM/100ML-% IV SOLN
2.0000 g | Freq: Four times a day (QID) | INTRAVENOUS | Status: AC
  Administered 2018-03-09 (×2): 2 g via INTRAVENOUS
  Filled 2018-03-09 (×2): qty 100

## 2018-03-09 MED ORDER — ROPIVACAINE HCL 5 MG/ML IJ SOLN
INTRAMUSCULAR | Status: DC | PRN
  Administered 2018-03-09: 30 mL via PERINEURAL

## 2018-03-09 MED ORDER — TRANEXAMIC ACID-NACL 1000-0.7 MG/100ML-% IV SOLN
1000.0000 mg | Freq: Once | INTRAVENOUS | Status: AC
  Administered 2018-03-09: 1000 mg via INTRAVENOUS
  Filled 2018-03-09: qty 100

## 2018-03-09 MED ORDER — 0.9 % SODIUM CHLORIDE (POUR BTL) OPTIME
TOPICAL | Status: DC | PRN
  Administered 2018-03-09: 1000 mL

## 2018-03-09 MED ORDER — TRANEXAMIC ACID-NACL 1000-0.7 MG/100ML-% IV SOLN
1000.0000 mg | INTRAVENOUS | Status: AC
  Administered 2018-03-09: 1000 mg via INTRAVENOUS
  Filled 2018-03-09: qty 100

## 2018-03-09 MED ORDER — APIXABAN 2.5 MG PO TABS
2.5000 mg | ORAL_TABLET | Freq: Two times a day (BID) | ORAL | Status: DC
  Administered 2018-03-10 – 2018-03-12 (×5): 2.5 mg via ORAL
  Filled 2018-03-09 (×5): qty 1

## 2018-03-09 MED ORDER — BUPIVACAINE IN DEXTROSE 0.75-8.25 % IT SOLN
INTRATHECAL | Status: DC | PRN
  Administered 2018-03-09: 1.7 mL via INTRATHECAL

## 2018-03-09 MED ORDER — MIDAZOLAM HCL 5 MG/5ML IJ SOLN
INTRAMUSCULAR | Status: DC | PRN
  Administered 2018-03-09 (×2): 1 mg via INTRAVENOUS

## 2018-03-09 MED ORDER — ALUM & MAG HYDROXIDE-SIMETH 200-200-20 MG/5ML PO SUSP: 15.0000 mL | ORAL | Status: DC | PRN

## 2018-03-09 MED ORDER — SODIUM CHLORIDE (PF) 0.9 % IJ SOLN
INTRAMUSCULAR | Status: AC
  Filled 2018-03-09: qty 50

## 2018-03-09 MED ORDER — SODIUM CHLORIDE 0.9 % IV SOLN
INTRAVENOUS | Status: DC | PRN
  Administered 2018-03-09: 20 ug/min via INTRAVENOUS

## 2018-03-09 MED ORDER — ACETAMINOPHEN 325 MG PO TABS: 325.0000 mg | ORAL_TABLET | Freq: Four times a day (QID) | ORAL | Status: DC | PRN

## 2018-03-09 MED ORDER — POLYETHYLENE GLYCOL 3350 17 G PO PACK
17.0000 g | PACK | Freq: Two times a day (BID) | ORAL | Status: DC
  Administered 2018-03-09 – 2018-03-12 (×4): 17 g via ORAL
  Filled 2018-03-09 (×4): qty 1

## 2018-03-09 MED ORDER — ATORVASTATIN CALCIUM 40 MG PO TABS
40.0000 mg | ORAL_TABLET | Freq: Every day | ORAL | Status: DC
  Administered 2018-03-10 – 2018-03-11 (×2): 40 mg via ORAL
  Filled 2018-03-09 (×2): qty 1

## 2018-03-09 MED ORDER — ONDANSETRON HCL 4 MG PO TABS: 4.0000 mg | ORAL_TABLET | Freq: Four times a day (QID) | ORAL | Status: DC | PRN

## 2018-03-09 MED ORDER — METOPROLOL SUCCINATE ER 25 MG PO TB24
25.0000 mg | ORAL_TABLET | Freq: Every day | ORAL | Status: DC
  Administered 2018-03-10 – 2018-03-12 (×3): 25 mg via ORAL
  Filled 2018-03-09 (×3): qty 1

## 2018-03-09 MED ORDER — ONDANSETRON HCL 4 MG/2ML IJ SOLN
INTRAMUSCULAR | Status: DC | PRN
  Administered 2018-03-09: 4 mg via INTRAVENOUS

## 2018-03-09 MED ORDER — VANCOMYCIN HCL 1000 MG IV SOLR
INTRAVENOUS | Status: AC
  Filled 2018-03-09: qty 1000

## 2018-03-09 SURGICAL SUPPLY — 58 items
ADH SKN CLS APL DERMABOND .7 (GAUZE/BANDAGES/DRESSINGS) ×1
ATTUNE MED ANAT PAT 38 KNEE (Knees) ×1 IMPLANT
ATTUNE MED ANAT PAT 38MM KNEE (Knees) ×1 IMPLANT
ATTUNE PS FEM LT SZ 7 CEM KNEE (Femur) ×2 IMPLANT
ATTUNE PSRP INSR SZ7 8 KNEE (Insert) ×1 IMPLANT
ATTUNE PSRP INSR SZ7 8MM KNEE (Insert) ×1 IMPLANT
BAG SPEC THK2 15X12 ZIP CLS (MISCELLANEOUS)
BAG ZIPLOCK 12X15 (MISCELLANEOUS) IMPLANT
BANDAGE ACE 6X5 VEL STRL LF (GAUZE/BANDAGES/DRESSINGS) ×3 IMPLANT
BANDAGE ELASTIC 6 VELCRO ST LF (GAUZE/BANDAGES/DRESSINGS) ×2 IMPLANT
BASE TIBIAL ROT PLAT SZ 8 KNEE (Knees) IMPLANT
BLADE SAW SGTL 11.0X1.19X90.0M (BLADE) IMPLANT
BLADE SAW SGTL 13.0X1.19X90.0M (BLADE) ×3 IMPLANT
BOWL SMART MIX CTS (DISPOSABLE) ×3 IMPLANT
BSPLAT TIB 8 CMNT ROT PLAT STR (Knees) ×1 IMPLANT
CEMENT HV SMART SET (Cement) ×4 IMPLANT
COVER SURGICAL LIGHT HANDLE (MISCELLANEOUS) ×3 IMPLANT
COVER WAND RF STERILE (DRAPES) IMPLANT
CUFF TOURN SGL QUICK 34 (TOURNIQUET CUFF) ×3
CUFF TRNQT CYL 34X4X40X1 (TOURNIQUET CUFF) ×1 IMPLANT
DECANTER SPIKE VIAL GLASS SM (MISCELLANEOUS) ×4 IMPLANT
DERMABOND ADVANCED (GAUZE/BANDAGES/DRESSINGS) ×2
DERMABOND ADVANCED .7 DNX12 (GAUZE/BANDAGES/DRESSINGS) ×1 IMPLANT
DRAPE U-SHAPE 47X51 STRL (DRAPES) ×3 IMPLANT
DRESSING AQUACEL AG SP 3.5X10 (GAUZE/BANDAGES/DRESSINGS) ×1 IMPLANT
DRSG AQUACEL AG SP 3.5X10 (GAUZE/BANDAGES/DRESSINGS) ×3
DURAPREP 26ML APPLICATOR (WOUND CARE) ×6 IMPLANT
ELECT REM PT RETURN 15FT ADLT (MISCELLANEOUS) ×3 IMPLANT
GLOVE BIOGEL M 7.0 STRL (GLOVE) IMPLANT
GLOVE BIOGEL PI IND STRL 7.5 (GLOVE) ×1 IMPLANT
GLOVE BIOGEL PI IND STRL 8.5 (GLOVE) ×1 IMPLANT
GLOVE BIOGEL PI INDICATOR 7.5 (GLOVE) ×2
GLOVE BIOGEL PI INDICATOR 8.5 (GLOVE) ×2
GLOVE ECLIPSE 8.0 STRL XLNG CF (GLOVE) ×3 IMPLANT
GLOVE ORTHO TXT STRL SZ7.5 (GLOVE) ×6 IMPLANT
GOWN STRL REUS W/TWL 2XL LVL3 (GOWN DISPOSABLE) ×3 IMPLANT
GOWN STRL REUS W/TWL LRG LVL3 (GOWN DISPOSABLE) ×3 IMPLANT
HANDPIECE INTERPULSE COAX TIP (DISPOSABLE) ×3
HOLDER FOLEY CATH W/STRAP (MISCELLANEOUS) ×2 IMPLANT
MANIFOLD NEPTUNE II (INSTRUMENTS) ×3 IMPLANT
NDL SAFETY ECLIPSE 18X1.5 (NEEDLE) IMPLANT
NEEDLE HYPO 18GX1.5 SHARP (NEEDLE)
PACK TOTAL KNEE CUSTOM (KITS) ×3 IMPLANT
PIN FIX SIGMA HP QUICK REL (PIN) ×2 IMPLANT
PROTECTOR NERVE ULNAR (MISCELLANEOUS) ×3 IMPLANT
SET HNDPC FAN SPRY TIP SCT (DISPOSABLE) ×1 IMPLANT
SET PAD KNEE POSITIONER (MISCELLANEOUS) ×3 IMPLANT
SUT MNCRL AB 4-0 PS2 18 (SUTURE) ×3 IMPLANT
SUT STRATAFIX PDS+ 0 24IN (SUTURE) ×3 IMPLANT
SUT VIC AB 1 CT1 36 (SUTURE) ×3 IMPLANT
SUT VIC AB 2-0 CT1 27 (SUTURE) ×9
SUT VIC AB 2-0 CT1 TAPERPNT 27 (SUTURE) ×3 IMPLANT
SYRINGE 3CC LL L/F (MISCELLANEOUS) ×3 IMPLANT
TIBIAL BASE ROT PLAT SZ 8 KNEE (Knees) ×3 IMPLANT
TRAY FOLEY MTR SLVR 16FR STAT (SET/KITS/TRAYS/PACK) ×3 IMPLANT
WATER STERILE IRR 1000ML POUR (IV SOLUTION) ×3 IMPLANT
WRAP KNEE MAXI GEL POST OP (GAUZE/BANDAGES/DRESSINGS) ×3 IMPLANT
YANKAUER SUCT BULB TIP 10FT TU (MISCELLANEOUS) ×3 IMPLANT

## 2018-03-09 NOTE — Anesthesia Postprocedure Evaluation (Signed)
Anesthesia Post Note  Patient: Stuart Flores  Procedure(s) Performed: TOTAL KNEE ARTHROPLASTY (Left Knee)     Patient location during evaluation: PACU Anesthesia Type: Regional and Spinal Level of consciousness: oriented and awake and alert Pain management: pain level controlled Vital Signs Assessment: post-procedure vital signs reviewed and stable Respiratory status: spontaneous breathing, respiratory function stable and patient connected to nasal cannula oxygen Cardiovascular status: blood pressure returned to baseline and stable Postop Assessment: no headache, no backache, no apparent nausea or vomiting and spinal receding Anesthetic complications: no    Last Vitals:  Vitals:   03/09/18 1450 03/09/18 1534  BP: (!) 106/58 107/64  Pulse: (!) 54 (!) 50  Resp: 16 18  Temp:  36.7 C  SpO2: 100% 100%    Last Pain:  Vitals:   03/09/18 1200  TempSrc:   PainSc: 0-No pain                 Kinston Magnan P Deaysia Grigoryan

## 2018-03-09 NOTE — Plan of Care (Signed)

## 2018-03-09 NOTE — Discharge Instructions (Signed)

## 2018-03-09 NOTE — Op Note (Signed)
NAME:  Stuart HazyKenneth W Strain                      MEDICAL RECORD NO.:  [id number]                             [facility]      PHYSICIAN:  Madlyn FrankelMatthew D. Charlann Boxerlin, M.D.  DATE OF BIRTH:  [birth year]      DATE OF PROCEDURE:  03/09/2018                                     OPERATIVE REPORT         PREOPERATIVE DIAGNOSIS:  Left knee osteoarthritis.      POSTOPERATIVE DIAGNOSIS:  Left knee osteoarthritis.      FINDINGS:  The patient was noted to have complete loss of cartilage and   bone-on-bone arthritis with associated osteophytes in the lateral and patellofemoral compartments of   the knee with a significant synovitis and associated effusion.  The patient had failed months of conservative treatment including medications, injection therapy, activity modification.     PROCEDURE:  Left total knee replacement.      COMPONENTS USED:  DePuy Attune rotating platform posterior stabilized knee   system, a size 7 femur, 8 tibia, size 8 mm PS AOX insert, and 38 anatomic patellar   button.      SURGEON:  Madlyn FrankelMatthew D. Charlann Boxerlin, M.D.      ASSISTANT:  Lanney GinsMatthew Babish, PA-C.      ANESTHESIA:  Regional and Spinal.      SPECIMENS:  None.      COMPLICATION:  None.      DRAINS:  None.  EBL: <100cc      TOURNIQUET TIME:   Total Tourniquet Time Documented: Thigh (Left) - 31 minutes Total: Thigh (Left) - 31 minutes  .      The patient was stable to the recovery room.      INDICATION FOR PROCEDURE:  Stuart Flores is a 84 y.o. male patient of   mine.  The patient had been seen, evaluated, and treated for months conservatively in the   office with medication, activity modification, and injections.  The patient had   radiographic changes of bone-on-bone arthritis with endplate sclerosis and osteophytes noted.  Based on the radiographic changes and failed conservative measures, the patient   decided to proceed with definitive treatment, total knee replacement.  Risks of infection, DVT, component failure,  need for revision surgery, neurovascular injury were reviewed in the office setting.  The postop course was reviewed stressing the efforts to maximize post-operative satisfaction and function.  Consent was obtained for benefit of pain   relief.      PROCEDURE IN DETAIL:  The patient was brought to the operative theater.   Once adequate anesthesia, preoperative antibiotics, 2 gm of Ancef,1 gm of Tranexamic Acid, and 10 mg of Decadron administered, the patient was positioned supine with a left thigh tourniquet placed.  The  left lower extremity was prepped and draped in sterile fashion.  A time-   out was performed identifying the patient, planned procedure, and the appropriate extremity.      The left lower extremity was placed in the [facility] leg holder.  The leg was   exsanguinated, tourniquet elevated to 250 mmHg.  A midline incision was   made  followed by median parapatellar arthrotomy.  Following initial   exposure, attention was first directed to the patella.  Precut   measurement was noted to be 26 mm.  I resected down to 15 mm and used a   38 anatomic patellar button to restore patellar height as well as cover the cut surface.      The lug holes were drilled and a metal shim was placed to protect the   patella from retractors and saw blade during the procedure.      At this point, attention was now directed to the femur.  The femoral   canal was opened with a drill, irrigated to try to prevent fat emboli.  An   intramedullary rod was passed at 5 degrees valgus, 9 mm of bone was   resected off the distal femur.  Following this resection, the tibia was   subluxated anteriorly.  Using the extramedullary guide, 2 mm of bone was resected off   the proximal lateral tibia.  We confirmed the gap would be   stable medially and laterally with a size 6 spacer block as well as confirmed that the tibial cut was perpendicular in the coronal plane, checking with an alignment rod.      Once this was  done, I sized the femur to be a size 7 in the anterior-   posterior dimension, chose a standard component based on medial and   lateral dimension.  The size 7 rotation block was then pinned in   position anterior referenced using the C-clamp to set rotation.  The   anterior, posterior, and  chamfer cuts were made without difficulty nor   notching making certain that I was along the anterior cortex to help   with flexion gap stability.      The final box cut was made off the lateral aspect of distal femur.      At this point, the tibia was sized to be a size 8.  The size 8 tray was   then pinned in position through the medial third of the tubercle,   drilled, and keel punched.  Trial reduction was now carried with a 7 femur,  8 tibia, a size 7 then 8 mm PS insert, and the 38 anatomic patella botton.  The knee was brought to full extension with good flexion stability with the patella   tracking through the trochlea without application of pressure.  Given   all these findings the trial components removed.  Final components were   opened and cement was mixed.  The knee was irrigated with normal saline solution and pulse lavage.  The synovial lining was   then injected with 30 cc of 0.25% Marcaine with epinephrine, 1 cc of Toradol and 30 cc of NS for a total of 61 cc.     Final implants were then cemented onto cleaned and dried cut surfaces of bone with the knee brought to extension with a size 8 mm PS trial insert.      Once the cement had fully cured, excess cement was removed   throughout the knee.  I confirmed that I was satisfied with the range of   motion and stability, and the final size 8 mm PS AOX insert was chosen.  It was   placed into the knee.      The tourniquet had been let down at 31 minutes.  No significant   hemostasis was required.  The extensor mechanism was then reapproximated using #1  Vicryl and #1 Stratafix sutures with the knee   in flexion.  The   remaining wound was  closed with 2-0 Vicryl and running 4-0 Monocryl.   The knee was cleaned, dried, dressed sterilely using Dermabond and   Aquacel dressing.  The patient was then   brought to recovery room in stable condition, tolerating the procedure   well.   Please note that Physician Assistant, Lanney Gins, PA-C was present for the entirety of the case, and was utilized for pre-operative positioning, peri-operative retractor management, general facilitation of the procedure and for primary wound closure at the end of the case.              Madlyn Frankel Charlann Boxer, M.D.    03/09/2018 10:33 AM

## 2018-03-09 NOTE — Evaluation (Signed)
Physical Therapy Evaluation Patient Details Name: Stuart Flores MRN: [id number] DOB: [birth year] Today's Date: 03/09/2018   History of Present Illness  84 yo male s/p L TKR on 03/09/18. PMH includes R TKR 2017 with revision 11/2017, OA, angina, CAD, HLD, bilateral knee arthroscopies, coronary angioplasty with stent placement, cataracts.  Clinical Impression   Pt presents with L knee pain, decreased L knee ROM, increased time and effort to perform mobility tasks, and decreased tolerance for ambulation this session. Pt to benefit from acute PT to address deficits. Pt ambulated 80 ft with RW with min guard assist, required verbal cuing throughout session. Pt educated on quad sets (5-10/hour), ankle pumps (20/hour), and heel slides (5-10/hour) to perform this afternoon/evening to lessen stiffness and increase circulation, to pt's tolerance and limited by pain. PT to progress mobility as tolerated, and will continue to follow acutely.      Follow Up Recommendations Follow surgeon's recommendation for DC plan and follow-up therapies;Supervision for mobility/OOB(OPPT)    Equipment Recommendations  None recommended by PT    Recommendations for Other Services       Precautions / Restrictions Precautions Precautions: Fall Restrictions Weight Bearing Restrictions: No Other Position/Activity Restrictions: WBAT       Mobility  Bed Mobility Overal bed mobility: Needs Assistance Bed Mobility: Supine to Sit     Supine to sit: Min guard;HOB elevated     General bed mobility comments: min guard for safety, pt very resistant to physical assist from PT.   Transfers Overall transfer level: Needs assistance Equipment used: Rolling walker (2 wheeled) Transfers: Sit to/from Stand Sit to Stand: From elevated surface;Min guard         General transfer comment: Min guard for safety, verbal cuing for hand placement (hand placement had to be described multiple times).    Ambulation/Gait Ambulation/Gait assistance: Min guard Gait Distance (Feet): 80 Feet Assistive device: Rolling walker (2 wheeled) Gait Pattern/deviations: Step-to pattern;Decreased weight shift to left;Decreased stride length Gait velocity: decr    General Gait Details: min guard for safety, verbal cuing for placement in RW, turning, sequencing.   Stairs            Wheelchair Mobility    Modified Rankin (Stroke Patients Only)       Balance Overall balance assessment: Mild deficits observed, not formally tested                                           Pertinent Vitals/Pain Pain Assessment: 0-10 Pain Score: 5  Pain Location: L knee  Pain Descriptors / Indicators: Sore Pain Intervention(s): Limited activity within patient's tolerance;Repositioned;Ice applied;Monitored during session    Home Living Family/patient expects to be discharged to:: Private residence Living Arrangements: Spouse/significant other Available Help at Discharge: Family;Available 24 hours/day(wife, 4 children that will come over to assist) Type of Home: House Home Access: Stairs to enter Entrance Stairs-Rails: Right Entrance Stairs-Number of Steps: 2  Home Layout: One level Home Equipment: Walker - 2 wheels;Cane - single point;Bedside commode      Prior Function Level of Independence: Independent               Hand Dominance   Dominant Hand: Right    Extremity/Trunk Assessment   Upper Extremity Assessment Upper Extremity Assessment: Overall WFL for tasks assessed    Lower Extremity Assessment Lower Extremity Assessment: Overall WFL for tasks assessed;LLE deficits/detail LLE Deficits /  Details: suspected post-surgical weakness; able to perform ankle pumps, quad sets, SLR without quad lag and without assist LLE Sensation: WNL    Cervical / Trunk Assessment Cervical / Trunk Assessment: Normal  Communication   Communication: HOH  Cognition  Arousal/Alertness: Awake/alert Behavior During Therapy: WFL for tasks assessed/performed Overall Cognitive Status: Within Functional Limits for tasks assessed                                 General Comments: Pt presented with some confusion during conversation at times, somewhat slow processing      General Comments      Exercises Total Joint Exercises Ankle Circles/Pumps: AROM;Both;20 reps;Seated Goniometric ROM: knee aarom ~10-80*, limited by pain    Assessment/Plan    PT Assessment Patient needs continued PT services  PT Problem List Decreased strength;Pain;Decreased range of motion;Decreased activity tolerance;Decreased knowledge of use of DME;Decreased balance;Decreased safety awareness;Decreased mobility       PT Treatment Interventions DME instruction;Therapeutic exercise;Gait training;Balance training;Stair training;Functional mobility training;Patient/family education    PT Goals (Current goals can be found in the Care Plan section)  Acute Rehab PT Goals Patient Stated Goal: none stated  PT Goal Formulation: With patient Time For Goal Achievement: 03/16/18 Potential to Achieve Goals: Good    Frequency 7X/week   Barriers to discharge        Co-evaluation               AM-PAC PT "6 Clicks" Mobility  Outcome Measure Help needed turning from your back to your side while in a flat bed without using bedrails?: A Little Help needed moving from lying on your back to sitting on the side of a flat bed without using bedrails?: A Little Help needed moving to and from a bed to a chair (including a wheelchair)?: A Little Help needed standing up from a chair using your arms (e.g., wheelchair or bedside chair)?: A Little Help needed to walk in hospital room?: A Little Help needed climbing 3-5 steps with a railing? : A Little 6 Click Score: 18    End of Session Equipment Utilized During Treatment: Gait belt Activity Tolerance: Patient tolerated  treatment well Patient left: in chair;with chair alarm set;with call bell/phone within reach;with SCD's reapplied Nurse Communication: Mobility status PT Visit Diagnosis: Other abnormalities of gait and mobility (R26.89);Difficulty in walking, not elsewhere classified (R26.2)    Time: 1710-1740 PT Time Calculation (min) (ACUTE ONLY): 30 min   Charges:   PT Evaluation $PT Eval Low Complexity: 1 Low PT Treatments $Gait Training: 8-22 mins       Nicola PoliceAlexa D Norwin Aleman, PT Acute Rehabilitation Services Pager [phone]  Office [phone]  Audreana Hancox D Despina Hiddenure 03/09/2018, 7:18 PM

## 2018-03-09 NOTE — Interval H&P Note (Signed)
History and Physical Interval Note:  03/09/2018 6:31 AM  Stuart Flores  has presented today for surgery, with the diagnosis of Left knee osteoarthritis  The various methods of treatment have been discussed with the patient and family. After consideration of risks, benefits and other options for treatment, the patient has consented to  Procedure(s) with comments: TOTAL KNEE ARTHROPLASTY (Left) - 70 mins as a surgical intervention .  The patient's history has been reviewed, patient examined, no change in status, stable for surgery.  I have reviewed the patient's chart and labs.  Questions were answered to the patient's satisfaction.     Shelda PalMatthew D Niambi Smoak

## 2018-03-09 NOTE — Anesthesia Procedure Notes (Signed)
Spinal  Patient location during procedure: OR Start time: 03/09/2018 9:10 AM End time: 03/09/2018 9:15 AM Staffing Anesthesiologist: Leonides GrillsEllender, Ryan P, MD Performed: anesthesiologist  Preanesthetic Checklist Completed: patient identified, surgical consent, pre-op evaluation, timeout performed, IV checked, risks and benefits discussed and monitors and equipment checked Spinal Block Patient position: sitting Prep: DuraPrep Patient monitoring: cardiac monitor, continuous pulse ox and blood pressure Approach: midline Location: L4-5 Injection technique: single-shot Needle Needle type: Pencan  Needle gauge: 24 G Needle length: 9 cm Assessment Sensory level: T10 Additional Notes Functioning IV was confirmed and monitors were applied. Sterile prep and drape, including hand hygiene and sterile gloves were used. The patient was positioned and the spine was prepped. The skin was anesthetized with lidocaine.  Free flow of clear CSF was obtained prior to injecting local anesthetic into the CSF.  The spinal needle aspirated freely following injection.  The needle was carefully withdrawn.  The patient tolerated the procedure well.

## 2018-03-09 NOTE — Transfer of Care (Signed)
Immediate Anesthesia Transfer of Care Note  Patient: Stuart Flores  Procedure(s) Performed: TOTAL KNEE ARTHROPLASTY (Left Knee)  Patient Location: PACU  Anesthesia Type:Spinal and MAC combined with regional for post-op pain  Level of Consciousness: drowsy and patient cooperative  Airway & Oxygen Therapy: Patient Spontanous Breathing and Patient connected to face mask oxygen  Post-op Assessment: Report given to RN and Post -op Vital signs reviewed and stable  Post vital signs: Reviewed and stable  Last Vitals:  Vitals Value Taken Time  BP 98/62 03/09/2018 11:00 AM  Temp    Pulse 55 03/09/2018 11:01 AM  Resp 13 03/09/2018 11:01 AM  SpO2 100 % 03/09/2018 11:01 AM  Vitals shown include unvalidated device data.  Last Pain:  Vitals:   03/09/18 0637  TempSrc: Oral      Patients Stated Pain Goal: 3 (28/76/81 1572)  Complications: No apparent anesthesia complications

## 2018-03-09 NOTE — Anesthesia Procedure Notes (Signed)
Anesthesia Regional Block: Adductor canal block   Pre-Anesthetic Checklist: ,, timeout performed, Correct Patient, Correct Site, Correct Laterality, Correct Procedure,, site marked, risks and benefits discussed, Surgical consent,  Pre-op evaluation,  At surgeon's request and post-op pain management  Laterality: Left  Prep: chloraprep       Needles:  Injection technique: Single-shot  Needle Type: Echogenic Stimulator Needle     Needle Length: 9cm  Needle Gauge: 21     Additional Needles:   Procedures:,,,, ultrasound used (permanent image in chart),,,,  Narrative:  Start time: 03/09/2018 8:10 AM End time: 03/09/2018 8:20 AM Injection made incrementally with aspirations every 5 mL.  Performed by: Personally  Anesthesiologist: Leonides GrillsEllender, Ryan P, MD  Additional Notes: Functioning IV was confirmed and monitors were applied. A time-out was performed. Hand hygiene and sterile gloves were used. The thigh was placed in a frog-leg position and prepped in a sterile fashion. A 90mm 21ga Arrow echogenic stimulator needle was placed using ultrasound guidance.  Negative aspiration and negative test dose prior to incremental administration of local anesthetic. The patient tolerated the procedure well.

## 2018-03-09 NOTE — Anesthesia Preprocedure Evaluation (Addendum)
Anesthesia Evaluation  Patient identified by MRN, date of birth, ID band Patient awake    Reviewed: Allergy & Precautions, NPO status , Patient's Chart, lab work & pertinent test results, reviewed documented beta blocker date and time   Airway Mallampati: II  TM Distance: >3 FB Neck ROM: Full    Dental no notable dental hx.    Pulmonary former smoker,    Pulmonary exam normal breath sounds clear to auscultation       Cardiovascular + CAD and + Cardiac Stents  Normal cardiovascular exam+ dysrhythmias Atrial Fibrillation  Rhythm:Regular Rate:Normal  CATH (2016) 1. Normal LV systolic function, 55-60%. 2. Small nondominant RCA. Large dominant circumflex coronary artery. Circumflex 99%/subtotally occluded. 3. Diffusely diseased LAD, proximal 40-50%, mid diffuse moderate disease, mid LAD with a 80% stenosis. 4. Diagonal 1 with a proximal 70% stenosis, small to moderate-sized vessel. 5. Successful PTCA and stenting of the mid circumflex coronary artery with implantation of a 4.0 x 30 mm resolute integrity DES, stenosis reduced from 99% to 0%, TIMI flow improved from 2-3 6. Successful PTCA and stenting of the mid LAD with implantation of a 2.75 x 22 mm resolute DES, 80% reduced to 0%  Cardiology clearance dated 10/ 2019 from dr Jacinto Halimganji with chart. Also, lov note dated 10-28-2017.     Neuro/Psych negative neurological ROS  negative psych ROS   GI/Hepatic negative GI ROS, Neg liver ROS,   Endo/Other  negative endocrine ROS  Renal/GU negative Renal ROS     Musculoskeletal negative musculoskeletal ROS (+)   Abdominal   Peds  Hematology HLD   Anesthesia Other Findings Left knee osteoarthritis  Reproductive/Obstetrics                            Anesthesia Physical Anesthesia Plan  ASA: III  Anesthesia Plan: Spinal and Regional   Post-op Pain Management:    Induction: Intravenous  PONV Risk Score  and Plan: 1 and Propofol infusion and Treatment may vary due to age or medical condition  Airway Management Planned: Natural Airway  Additional Equipment:   Intra-op Plan:   Post-operative Plan:   Informed Consent: I have reviewed the patients History and Physical, chart, labs and discussed the procedure including the risks, benefits and alternatives for the proposed anesthesia with the patient or authorized representative who has indicated his/her understanding and acceptance.   Dental advisory given  Plan Discussed with: CRNA  Anesthesia Plan Comments:         Anesthesia Quick Evaluation

## 2018-03-10 ENCOUNTER — Encounter (HOSPITAL_COMMUNITY): Payer: Self-pay | Admitting: Orthopedic Surgery

## 2018-03-10 LAB — BASIC METABOLIC PANEL WITH GFR
Anion gap: 6 (ref 5–15)
BUN: 19 mg/dL (ref 8–23)
CO2: 25 mmol/L (ref 22–32)
Calcium: 8.4 mg/dL — ABNORMAL LOW (ref 8.9–10.3)
Chloride: 106 mmol/L (ref 98–111)
Creatinine, Ser: 0.84 mg/dL (ref 0.61–1.24)
GFR calc Af Amer: >60 mL/min
GFR calc non Af Amer: >60 mL/min
Glucose, Bld: 110 mg/dL — ABNORMAL HIGH (ref 70–99)
Potassium: 4.7 mmol/L (ref 3.5–5.1)
Sodium: 137 mmol/L (ref 135–145)

## 2018-03-10 LAB — CBC
HCT: 32.5 % — ABNORMAL LOW (ref 39.0–52.0)
Hemoglobin: 10.3 g/dL — ABNORMAL LOW (ref 13.0–17.0)
MCH: 29.9 pg (ref 26.0–34.0)
MCHC: 31.7 g/dL (ref 30.0–36.0)
MCV: 94.2 fL (ref 80.0–100.0)
Platelets: 237 10*3/uL (ref 150–400)
RBC: 3.45 MIL/uL — ABNORMAL LOW (ref 4.22–5.81)
RDW: 14.7 % (ref 11.5–15.5)
WBC: 14.2 10*3/uL — ABNORMAL HIGH (ref 4.0–10.5)
nRBC: 0 % (ref 0.0–0.2)

## 2018-03-10 MED ORDER — POLYETHYLENE GLYCOL 3350 17 G PO PACK: 17.0000 g | PACK | Freq: Two times a day (BID) | ORAL | 0 refills | Status: DC

## 2018-03-10 MED ORDER — DOCUSATE SODIUM 100 MG PO CAPS: 100.0000 mg | ORAL_CAPSULE | Freq: Two times a day (BID) | ORAL | 0 refills | Status: DC

## 2018-03-10 MED ORDER — HYDROCODONE-ACETAMINOPHEN 7.5-325 MG PO TABS: 1.0000 | ORAL_TABLET | ORAL | 0 refills | Status: DC | PRN

## 2018-03-10 MED ORDER — METHOCARBAMOL 500 MG PO TABS: 500.0000 mg | ORAL_TABLET | Freq: Four times a day (QID) | ORAL | 0 refills | Status: DC | PRN

## 2018-03-10 MED ORDER — FERROUS SULFATE 325 (65 FE) MG PO TABS: 325.0000 mg | ORAL_TABLET | Freq: Three times a day (TID) | ORAL | 3 refills | Status: DC

## 2018-03-10 NOTE — Progress Notes (Signed)
Patient ID: Stuart Flores, male   DOB: 1933/12/01, 84 y.o.   MRN: [id number] Subjective: 1 Day Post-Op Procedure(s) (LRB): TOTAL KNEE ARTHROPLASTY (Left)    Patient reports pain as mild to moderate depending on activity. Worried about home discharge today based on lack of support at home. See below  Objective:   VITALS:   Vitals:   03/10/18 0158 03/10/18 0552  BP: 105/70 107/68  Pulse: (!) 50 (!) 47  Resp: 20 16  Temp: 97.6 F (36.4 C) 97.6 F (36.4 C)  SpO2: 100% 100%    Neurovascular intact Incision: dressing C/D/I  LABS Recent Labs    03/10/18 0631  HGB 10.3*  HCT 32.5*  WBC 14.2*  PLT 237    Recent Labs    03/10/18 0631  NA 137  K 4.7  BUN 19  CREATININE 0.84  GLUCOSE 110*    No results for input(s): LABPT, INR in the last 72 hours.   Assessment/Plan: 1 Day Post-Op Procedure(s) (LRB): TOTAL KNEE ARTHROPLASTY (Left)   Advance diet Up with therapy   Assess needs today with therapy Likely will require an additional night in hospital to work with in house therapist to assure safe home discharge versus facility options  Will reassess in am for discharge to home Pain control Monitor vital signs with activity

## 2018-03-10 NOTE — Progress Notes (Signed)
Physical Therapy Treatment Patient Details Name: Stuart Flores MRN: [id number] DOB: 18-Jan-1934 Today's Date: 03/10/2018    History of Present Illness 84 yo male s/p L TKR on 03/09/18. PMH includes R TKR 2017 with revision 11/2017, OA, angina, CAD, HLD, bilateral knee arthroscopies, coronary angioplasty with stent placement, cataracts.    PT Comments    Pt continues cooperative and progressing steadily with mobility.  Will review stairs in am prior to dc.   Follow Up Recommendations  Follow surgeon's recommendation for DC plan and follow-up therapies;Supervision for mobility/OOB     Equipment Recommendations  None recommended by PT    Recommendations for Other Services       Precautions / Restrictions Precautions Precautions: Fall Restrictions Weight Bearing Restrictions: No Other Position/Activity Restrictions: WBAT     Mobility  Bed Mobility Overal bed mobility: Needs Assistance Bed Mobility: Sit to Supine     Supine to sit: Min guard;HOB elevated Sit to supine: Min assist   General bed mobility comments: Increased time and min guard for safety  Transfers Overall transfer level: Needs assistance Equipment used: Rolling walker (2 wheeled) Transfers: Sit to/from Stand Sit to Stand: From elevated surface;Min guard         General transfer comment: Min guard for safety, verbal cuing for hand placement (hand placement had to be described multiple times).   Ambulation/Gait Ambulation/Gait assistance: Min guard Gait Distance (Feet): 200 Feet Assistive device: Rolling walker (2 wheeled) Gait Pattern/deviations: Decreased weight shift to left;Decreased stride length;Step-to pattern;Step-through pattern;Trunk flexed Gait velocity: decr    General Gait Details: cues for posture, position from RW and initial sequence   Stairs             Wheelchair Mobility    Modified Rankin (Stroke Patients Only)       Balance Overall balance assessment: Mild  deficits observed, not formally tested                                          Cognition Arousal/Alertness: Awake/alert Behavior During Therapy: WFL for tasks assessed/performed Overall Cognitive Status: Within Functional Limits for tasks assessed                                        Exercises Total Joint Exercises Ankle Circles/Pumps: AROM;Both;20 reps;Seated Quad Sets: AROM;Both;10 reps;Supine Heel Slides: AAROM;Left;15 reps;Supine Straight Leg Raises: AAROM;AROM;Left;15 reps;Supine Goniometric ROM: L knee AAROM -10 - 90    General Comments        Pertinent Vitals/Pain Pain Assessment: 0-10 Pain Score: 4  Pain Location: L knee  Pain Descriptors / Indicators: Sore Pain Intervention(s): Limited activity within patient's tolerance;Monitored during session;Premedicated before session;Ice applied    Home Living                      Prior Function            PT Goals (current goals can now be found in the care plan section) Acute Rehab PT Goals Patient Stated Goal: none stated  PT Goal Formulation: With patient Time For Goal Achievement: 03/16/18 Potential to Achieve Goals: Good Progress towards PT goals: Progressing toward goals    Frequency    7X/week      PT Plan Current plan remains appropriate    Co-evaluation  AM-PAC PT "6 Clicks" Mobility   Outcome Measure  Help needed turning from your back to your side while in a flat bed without using bedrails?: A Little Help needed moving from lying on your back to sitting on the side of a flat bed without using bedrails?: A Little Help needed moving to and from a bed to a chair (including a wheelchair)?: A Little Help needed standing up from a chair using your arms (e.g., wheelchair or bedside chair)?: A Little Help needed to walk in hospital room?: A Little Help needed climbing 3-5 steps with a railing? : A Little 6 Click Score: 18    End of  Session Equipment Utilized During Treatment: Gait belt Activity Tolerance: Patient tolerated treatment well Patient left: in chair;with chair alarm set;with call bell/phone within reach;with family/visitor present Nurse Communication: Mobility status PT Visit Diagnosis: Other abnormalities of gait and mobility (R26.89);Difficulty in walking, not elsewhere classified (R26.2)     Time: 1310-1350 PT Time Calculation (min) (ACUTE ONLY): 40 min  Charges:  $Gait Training: 23-37 mins $Therapeutic Exercise: 8-22 mins $Therapeutic Activity: 8-22 mins                     Mauro KaufmannHunter Raad Clayson PT Acute Rehabilitation Services Pager [phone] Office [phone]    [name] 03/10/2018, 2:57 PM

## 2018-03-11 LAB — BASIC METABOLIC PANEL
Anion gap: 4 — ABNORMAL LOW (ref 5–15)
BUN: 25 mg/dL — ABNORMAL HIGH (ref 8–23)
CO2: 26 mmol/L (ref 22–32)
Calcium: 8.2 mg/dL — ABNORMAL LOW (ref 8.9–10.3)
Chloride: 108 mmol/L (ref 98–111)
Creatinine, Ser: 0.92 mg/dL (ref 0.61–1.24)
GFR calc Af Amer: >60 mL/min (ref >60)
GFR calc non Af Amer: >60 mL/min (ref >60)
Glucose, Bld: 97 mg/dL (ref 70–99)
Potassium: 4.8 mmol/L (ref 3.5–5.1)
Sodium: 138 mmol/L (ref 135–145)

## 2018-03-11 LAB — CBC
HCT: 27.3 % — ABNORMAL LOW (ref 39.0–52.0)
Hemoglobin: 8.7 g/dL — ABNORMAL LOW (ref 13.0–17.0)
MCH: 30.7 pg (ref 26.0–34.0)
MCHC: 31.9 g/dL (ref 30.0–36.0)
MCV: 96.5 fL (ref 80.0–100.0)
Platelets: 195 10*3/uL (ref 150–400)
RBC: 2.83 MIL/uL — ABNORMAL LOW (ref 4.22–5.81)
RDW: 14.9 % (ref 11.5–15.5)
WBC: 12.3 10*3/uL — ABNORMAL HIGH (ref 4.0–10.5)
nRBC: 0 % (ref 0.0–0.2)

## 2018-03-11 MED ORDER — SODIUM CHLORIDE 0.9 % IV BOLUS
500.0000 mL | Freq: Once | INTRAVENOUS | Status: AC
  Administered 2018-03-11: 500 mL via INTRAVENOUS

## 2018-03-11 NOTE — Progress Notes (Signed)
Physical Therapy Treatment Patient Details Name: Stuart Flores MRN: [id number] DOB: 11-20-1933 Today's Date: 03/11/2018    History of Present Illness 84 yo male s/p L TKR on 03/09/18. PMH includes R TKR 2017 with revision 11/2017, OA, angina, CAD, HLD, bilateral knee arthroscopies, coronary angioplasty with stent placement, cataracts.    PT Comments    Pt motivated this am and eager for dc home.  Pt ambulated in halls and practiced stairs before becoming dizzy and moved to sitting - BP 47/29 - RN aware - pt wheeled to bedside and transferred to bed supine with LEs elevated.  Follow Up Recommendations  Follow surgeon's recommendation for DC plan and follow-up therapies;Supervision for mobility/OOB     Equipment Recommendations  None recommended by PT    Recommendations for Other Services       Precautions / Restrictions Precautions Precautions: Fall Restrictions Weight Bearing Restrictions: No Other Position/Activity Restrictions: WBAT     Mobility  Bed Mobility Overal bed mobility: Needs Assistance Bed Mobility: Sit to Supine       Sit to supine: Min assist;Mod assist   General bed mobility comments: Increased assist 2* low BP  Transfers Overall transfer level: Needs assistance Equipment used: Rolling walker (2 wheeled) Transfers: Sit to/from [employer] Sit to Stand: From elevated surface;Min guard Stand pivot transfers: Mod assist       General transfer comment: min guard for stand but mod assist to stand pvt chair to bed 2* dizziness and low BP  Ambulation/Gait Ambulation/Gait assistance: Min guard Gait Distance (Feet): 200 Feet Assistive device: Rolling walker (2 wheeled) Gait Pattern/deviations: Decreased step length - right;Decreased step length - left;Step-to pattern;Step-through pattern;Shuffle;Antalgic;Trunk flexed Gait velocity: decr    General Gait Details: cues for posture, position from RW and initial  sequence   Stairs Stairs: Yes Stairs assistance: Min assist Stair Management: No rails;Two rails;Step to pattern;Forwards;With walker Number of Stairs: 6 General stair comments: 5 stairs with bil rails, single step fwd with RW; cues for sequence and foot/RW placement   Wheelchair Mobility    Modified Rankin (Stroke Patients Only)       Balance Overall balance assessment: Mild deficits observed, not formally tested                                          Cognition Arousal/Alertness: Awake/alert Behavior During Therapy: WFL for tasks assessed/performed Overall Cognitive Status: Within Functional Limits for tasks assessed                                 General Comments: Pt presented with some confusion during conversation at times, somewhat slow processing      Exercises      General Comments        Pertinent Vitals/Pain Pain Assessment: 0-10 Pain Score: 4  Pain Location: L knee  Pain Descriptors / Indicators: Sore Pain Intervention(s): Limited activity within patient's tolerance;Monitored during session;Premedicated before session;Ice applied    Home Living                      Prior Function            PT Goals (current goals can now be found in the care plan section) Acute Rehab PT Goals Patient Stated Goal: none stated  PT Goal Formulation: With patient Time  For Goal Achievement: 03/16/18 Potential to Achieve Goals: Good Progress towards PT goals: Progressing toward goals    Frequency    7X/week      PT Plan Current plan remains appropriate    Co-evaluation              AM-PAC PT "6 Clicks" Mobility   Outcome Measure  Help needed turning from your back to your side while in a flat bed without using bedrails?: A Little Help needed moving from lying on your back to sitting on the side of a flat bed without using bedrails?: A Little Help needed moving to and from a bed to a chair (including a  wheelchair)?: A Little Help needed standing up from a chair using your arms (e.g., wheelchair or bedside chair)?: A Little Help needed to walk in hospital room?: A Little Help needed climbing 3-5 steps with a railing? : A Little 6 Click Score: 18    End of Session Equipment Utilized During Treatment: Gait belt Activity Tolerance: Patient tolerated treatment well Patient left: with call bell/phone within reach;with family/visitor present;in bed;with nursing/sitter in room Nurse Communication: Mobility status PT Visit Diagnosis: Other abnormalities of gait and mobility (R26.89);Difficulty in walking, not elsewhere classified (R26.2)     Time: [id number] PT Time Calculation (min) (ACUTE ONLY): 30 min  Charges:  $Gait Training: 8-22 mins $Therapeutic Activity: 8-22 mins                     Stuart KaufmannHunter Hesper Flores PT Acute Rehabilitation Services Pager [phone] Office [phone]    Stuart Flores 03/11/2018, 12:55 PM

## 2018-03-11 NOTE — Discharge Summary (Addendum)
Physician Discharge Summary   Patient ID: Stuart Flores MRN: [id number] DOB/AGE: 1934/02/20 84 y.o.  Admit date: 03/09/2018 Discharge date: 03/12/18  Primary Diagnosis: Left knee primary osteoarthritis  Admission Diagnoses:  Past Medical History:  Diagnosis Date  . Arthritis    ostearthritis- back, knee  . BPH with obstruction/lower urinary tract symptoms    urologist-  dr r. evans _0   . Bradycardia   . Coronary artery disease    cardiologist-- dr Einar Gip  . History of glaucoma    per pt no issues since cataract extraction's bilaterally 2015  . History of squamous cell carcinoma excision    face x2  . PAF (paroxysmal atrial fibrillation) [facility] [place])    cardiologist-- dr Einar Gip  . S/P drug eluting coronary stent placement 03/04/2015   DES x1 to mid CFx and DES x1 to mid LAD  . Wears glasses   . Wears hearing aid in both ears    Discharge Diagnoses:   Principal Problem:   S/P left TKA Active Problems:   S/P knee replacement  Estimated body mass index is 22.94 kg/m as calculated from the following:   Height as of this encounter: 6' (1.829 m).   Weight as of this encounter: 76.7 kg.  Procedure:  Procedure(s) (LRB): TOTAL KNEE ARTHROPLASTY (Left)   Consults: None  HPI: see H&P Laboratory Data: Admission on 03/09/2018  Component Date Value Ref Range Status  . ABO/RH(D) 03/09/2018 O POS   Final  . Antibody Screen 03/09/2018 NEG   Final  . Sample Expiration 03/09/2018    Final                   Value:03/12/2018 Performed at [facility], [place] [address] [place], [place] [address]   . WBC 03/10/2018 14.2* 4.0 - 10.5 K/uL Final  . RBC 03/10/2018 3.45* 4.22 - 5.81 MIL/uL Final  . Hemoglobin 03/10/2018 10.3* 13.0 - 17.0 g/dL Final  . HCT 03/10/2018 32.5* 39.0 - 52.0 % Final  . MCV 03/10/2018 94.2  80.0 - 100.0 fL Final  . MCH 03/10/2018 29.9  26.0 - 34.0 pg Final  . MCHC 03/10/2018 31.7  30.0 - 36.0 g/dL Final  . RDW 03/10/2018 14.7  11.5 - 15.5 %  Final  . Platelets 03/10/2018 237  150 - 400 K/uL Final  . nRBC 03/10/2018 0.0  0.0 - 0.2 % Final   Performed at [facility], [place] [address] [place], [place] [address]  . Sodium 03/10/2018 137  135 - 145 mmol/L Final  . Potassium 03/10/2018 4.7  3.5 - 5.1 mmol/L Final  . Chloride 03/10/2018 106  98 - 111 mmol/L Final  . CO2 03/10/2018 25  22 - 32 mmol/L Final  . Glucose, Bld 03/10/2018 110* 70 - 99 mg/dL Final  . BUN 03/10/2018 19  8 - 23 mg/dL Final  . Creatinine, Ser 03/10/2018 0.84  0.61 - 1.24 mg/dL Final  . Calcium 03/10/2018 8.4* 8.9 - 10.3 mg/dL Final  . GFR calc non Af Amer 03/10/2018 >60  >60 mL/min Final  . GFR calc Af Amer 03/10/2018 >60  >60 mL/min Final  . Anion gap 03/10/2018 6  5 - 15 Final   Performed at [facility], [place] [address] [place],  [address]  . WBC 03/11/2018 12.3* 4.0 - 10.5 K/uL Final  . RBC 03/11/2018 2.83* 4.22 - 5.81 MIL/uL Final  . Hemoglobin 03/11/2018 8.7* 13.0 - 17.0 g/dL Final  . HCT 03/11/2018 27.3* 39.0 - 52.0 %  Final  . MCV 03/11/2018 96.5  80.0 - 100.0 fL Final  . MCH 03/11/2018 30.7  26.0 - 34.0 pg Final  . MCHC 03/11/2018 31.9  30.0 - 36.0 g/dL Final  . RDW 03/11/2018 14.9  11.5 - 15.5 % Final  . Platelets 03/11/2018 195  150 - 400 K/uL Final  . nRBC 03/11/2018 0.0  0.0 - 0.2 % Final   Performed at [facility], [place] [address], [place], [place] [address]  . Sodium 03/11/2018 138  135 - 145 mmol/L Final  . Potassium 03/11/2018 4.8  3.5 - 5.1 mmol/L Final  . Chloride 03/11/2018 108  98 - 111 mmol/L Final  . CO2 03/11/2018 26  22 - 32 mmol/L Final  . Glucose, Bld 03/11/2018 97  70 - 99 mg/dL Final  . BUN 03/11/2018 25* 8 - 23 mg/dL Final  . Creatinine, Ser 03/11/2018 0.92  0.61 - 1.24 mg/dL Final  . Calcium 03/11/2018 8.2* 8.9 - 10.3 mg/dL Final  . GFR calc non Af Amer 03/11/2018 >60  >60 mL/min Final  . GFR calc Af Amer 03/11/2018 >60  >60 mL/min Final  . Anion gap  03/11/2018 4* 5 - 15 Final   Performed at [facility], [place] [address] [place], [place] [address]  Hospital Outpatient Visit on 02/22/2018  Component Date Value Ref Range Status  . MRSA, PCR 02/22/2018 NEGATIVE  NEGATIVE Final  . Staphylococcus aureus 02/22/2018 NEGATIVE  NEGATIVE Final   Comment: (NOTE) The Xpert SA Assay (FDA approved for NASAL specimens in patients 42 years of age and older), is one component of a comprehensive surveillance program. It is not intended to diagnose infection nor to guide or monitor treatment. Performed at [facility], [place] [address] [place], [place] [address]   . WBC 02/22/2018 7.1  4.0 - 10.5 K/uL Final  . RBC 02/22/2018 3.65* 4.22 - 5.81 MIL/uL Final  . Hemoglobin 02/22/2018 11.3* 13.0 - 17.0 g/dL Final  . HCT 02/22/2018 35.0* 39.0 - 52.0 % Final  . MCV 02/22/2018 95.9  80.0 - 100.0 fL Final  . MCH 02/22/2018 31.0  26.0 - 34.0 pg Final  . MCHC 02/22/2018 32.3  30.0 - 36.0 g/dL Final  . RDW 02/22/2018 14.9  11.5 - 15.5 % Final  . Platelets 02/22/2018 229  150 - 400 K/uL Final  . nRBC 02/22/2018 0.0  0.0 - 0.2 % Final   Performed at [facility], [place] [address] [place], [place] [address]  . Sodium 02/22/2018 139  135 - 145 mmol/L Final  . Potassium 02/22/2018 4.9  3.5 - 5.1 mmol/L Final  . Chloride 02/22/2018 108  98 - 111 mmol/L Final  . CO2 02/22/2018 28  22 - 32 mmol/L Final  . Glucose, Bld 02/22/2018 76  70 - 99 mg/dL Final  . BUN 02/22/2018 18  8 - 23 mg/dL Final  . Creatinine, Ser 02/22/2018 0.74  0.61 - 1.24 mg/dL Final  . Calcium 02/22/2018 8.8* 8.9 - 10.3 mg/dL Final  . GFR calc non Af Amer 02/22/2018 >60  >60 mL/min Final  . GFR calc Af Amer 02/22/2018 >60  >60 mL/min Final   Comment: (NOTE) The eGFR has been calculated using the CKD EPI equation. This calculation has not been validated in all clinical situations. eGFR's persistently <60 mL/min signify possible Chronic  Kidney Disease.   Georgiann Hahn gap 02/22/2018 3* 5 - 15 Final   Performed at [facility], [place]  [address] [place], [place] [address]     X-Rays:No results found.  EKG: Orders placed or performed during the hospital encounter of 12/09/16  . EKG 12-Lead  . EKG 12-Lead     Hospital Course: Stuart Flores is a 84 y.o. who was admitted to [facility]. They were brought to the operating room on 03/09/2018 and underwent Procedure(s): TOTAL KNEE ARTHROPLASTY.  Patient tolerated the procedure well and was later transferred to the recovery room and then to the orthopaedic floor for postoperative care.  They were given PO and IV analgesics for pain control following their surgery.  They were given 24 hours of postoperative antibiotics of  Anti-infectives (From admission, onward)   Start     Dose/Rate Route Frequency Ordered Stop   03/09/18 1500  ceFAZolin (ANCEF) IVPB 2g/100 mL premix     2 g 200 mL/hr over 30 Minutes Intravenous Every 6 hours 03/09/18 1233 03/09/18 2108   03/09/18 0645  ceFAZolin (ANCEF) IVPB 2g/100 mL premix     2 g 200 mL/hr over 30 Minutes Intravenous On call to O.R. 03/09/18 7616 03/09/18 0910     and started on DVT prophylaxis in the form of TED hose and Eliquis, SCDs.   PT and OT were ordered for total joint protocol.  Discharge planning consulted to help with postop disposition and equipment needs.  Patient had a good night on the evening of surgery.  They started to get up OOB with therapy on day one. Hemovac drain was pulled without difficulty.  Continued to work with therapy into day two.  He did have a hypotensive vasovagal episode on POD 2.  By day three, the patient had progressed with therapy and meeting their goals, Hgb stable and asymptomatic.  Incision was healing well.  Patient was seen in rounds and was ready to go home.   Diet: Regular diet Activity:WBAT Follow-up:in 10-14 days Disposition - Home  With HHPT Discharged Condition:  good   Discharge Instructions    Call MD / Call 911   Complete by:  As directed    If you experience chest pain or shortness of breath, CALL 911 and be transported to the hospital emergency room.  If you develope a fever above 101 F, pus (white drainage) or increased drainage or redness at the wound, or calf pain, call your surgeon's office.   Call MD / Call 911   Complete by:  As directed    If you experience chest pain or shortness of breath, CALL 911 and be transported to the hospital emergency room.  If you develope a fever above 101 F, pus (white drainage) or increased drainage or redness at the wound, or calf pain, call your surgeon's office.   Change dressing   Complete by:  As directed    Maintain surgical dressing until follow up in the clinic. If the edges start to pull up, may reinforce with tape. If the dressing is no longer working, may remove and cover with gauze and tape, but must keep the area dry and clean.  Call with any questions or concerns.   Constipation Prevention   Complete by:  As directed    Drink plenty of fluids.  Prune juice may be helpful.  You may use a stool softener, such as Colace (over the counter) 100 mg twice a day.  Use MiraLax (over the counter) for constipation as needed.   Constipation Prevention   Complete by:  As directed    Drink plenty of fluids.  Prune juice may be helpful.  You may use a stool softener, such as Colace (over the counter) 100 mg twice a day.  Use MiraLax (over the counter) for constipation as needed.   Diet - low sodium heart healthy   Complete by:  As directed    Diet - low sodium heart healthy   Complete by:  As directed    Discharge instructions   Complete by:  As directed    Maintain surgical dressing until follow up in the clinic. If the edges start to pull up, may reinforce with tape. If the dressing is no longer working, may remove and cover with gauze and tape, but must keep the area dry and clean.  Follow up in 2 weeks at  [facility]. Call with any questions or concerns.   Increase activity slowly as tolerated   Complete by:  As directed    Weight bearing as tolerated with assist device (walker, cane, etc) as directed, use it as long as suggested by your surgeon or therapist, typically at least 4-6 weeks.   Increase activity slowly as tolerated   Complete by:  As directed    TED hose   Complete by:  As directed    Use stockings (TED hose) for 2 weeks on both leg(s).  You may remove them at night for sleeping.     Allergies as of 03/11/2018   No Known Allergies     Medication List    TAKE these medications   alfuzosin 10 MG 24 hr tablet Commonly known as:  UROXATRAL Take 10 mg by mouth daily with breakfast.   apixaban 5 MG Tabs tablet Commonly known as:  ELIQUIS Take 1 tablet (5 mg total) by mouth 2 (two) times daily.   atorvastatin 40 MG tablet Commonly known as:  LIPITOR Take 40 mg by mouth daily. In the morning   docusate sodium 100 MG capsule Commonly known as:  COLACE Take 1 capsule (100 mg total) by mouth 2 (two) times daily.   ferrous sulfate 325 (65 FE) MG tablet Take 1 tablet (325 mg total) by mouth 3 (three) times daily with meals.   HYDROcodone-acetaminophen 7.5-325 MG tablet Commonly known as:  NORCO Take 1-2 tablets by mouth every 4 (four) hours as needed for moderate pain.   methocarbamol 500 MG tablet Commonly known as:  ROBAXIN Take 1 tablet (500 mg total) by mouth every 6 (six) hours as needed for muscle spasms.   metoprolol succinate 50 MG 24 hr tablet Commonly known as:  TOPROL-XL Take 25 mg by mouth daily with breakfast.   multivitamin with minerals Tabs tablet Take 1 tablet by mouth daily.   nitroGLYCERIN 0.4 MG SL tablet Commonly known as:  NITROSTAT Place 0.4 mg under the tongue every 5 (five) minutes as needed for chest pain (No more than 3 doses).   polyethylene glycol packet Commonly known as:  MIRALAX / GLYCOLAX Take 17 g by mouth 2 (two)  times daily.   PROBIOTIC DAILY PO Take 1 capsule by mouth daily.            Discharge Care Instructions  (From admission, onward)         Start     Ordered   03/10/18 0000  Change dressing    Comments:  Maintain surgical dressing until follow up in the clinic. If the edges start to pull up, may reinforce with tape. If the dressing is no longer working, may remove and cover with gauze and tape,  but must keep the area dry and clean.  Call with any questions or concerns.   03/10/18 1014         Follow-up Information    Paralee Cancel, MD. Schedule an appointment as soon as possible for a visit in 2 weeks.   Specialty:  Orthopedic Surgery Contact information: [address] [place] [address] [phone]           Signed: Lacie Draft, PA-C Orthopaedic Surgery 03/11/2018, 9:06 AM

## 2018-03-11 NOTE — Progress Notes (Signed)
Physical Therapy Treatment Patient Details Name: Stuart Flores MRN: [id number] DOB: 12-Oct-1933 Today's Date: 03/11/2018    History of Present Illness 84 yo male s/p L TKR on 03/09/18. PMH includes R TKR 2017 with revision 11/2017, OA, angina, CAD, HLD, bilateral knee arthroscopies, coronary angioplasty with stent placement, cataracts.    PT Comments    Pt very cooperative and progressing with mobility and therex program despite c/o low back pain/stiffness.  No c/o dizziness with mobility - BP 98/60 which pt states is close to his normal.   Follow Up Recommendations  Follow surgeon's recommendation for DC plan and follow-up therapies;Supervision for mobility/OOB     Equipment Recommendations  None recommended by PT    Recommendations for Other Services       Precautions / Restrictions Precautions Precautions: Fall Restrictions Weight Bearing Restrictions: No Other Position/Activity Restrictions: WBAT     Mobility  Bed Mobility Overal bed mobility: Needs Assistance Bed Mobility: Supine to Sit;Sit to Supine     Supine to sit: Min assist Sit to supine: Min assist   General bed mobility comments: cues for sequence and use of R LE to self assist  Transfers Overall transfer level: Needs assistance Equipment used: Rolling walker (2 wheeled) Transfers: Sit to/from Stand Sit to Stand: Min guard         General transfer comment: cues for LE management and use of UEs to self assist  Ambulation/Gait Ambulation/Gait assistance: Min guard Gait Distance (Feet): 400 Feet Assistive device: Rolling walker (2 wheeled) Gait Pattern/deviations: Decreased step length - right;Decreased step length - left;Step-to pattern;Step-through pattern;Shuffle;Antalgic;Trunk flexed     General Gait Details: cues for posture, position from RW and initial sequence   Stairs             Wheelchair Mobility    Modified Rankin (Stroke Patients Only)       Balance Overall balance  assessment: Mild deficits observed, not formally tested                                          Cognition Arousal/Alertness: Awake/alert Behavior During Therapy: WFL for tasks assessed/performed Overall Cognitive Status: Within Functional Limits for tasks assessed                                 General Comments: Pt presented with some confusion during conversation at times, somewhat slow processing      Exercises Total Joint Exercises Ankle Circles/Pumps: AROM;Both;20 reps;Seated Quad Sets: AROM;Both;Supine;15 reps Heel Slides: AAROM;Left;15 reps;Supine Straight Leg Raises: AAROM;Left;15 reps;Supine    General Comments        Pertinent Vitals/Pain Pain Assessment: 0-10 Pain Score: 5  Pain Location: L knee and low back Pain Descriptors / Indicators: Sore Pain Intervention(s): Limited activity within patient's tolerance;Monitored during session;Premedicated before session;Ice applied    Home Living                      Prior Function            PT Goals (current goals can now be found in the care plan section) Acute Rehab PT Goals Patient Stated Goal: none stated  PT Goal Formulation: With patient Time For Goal Achievement: 03/16/18 Potential to Achieve Goals: Good Progress towards PT goals: Progressing toward goals    Frequency  7X/week      PT Plan Current plan remains appropriate    Co-evaluation              AM-PAC PT "6 Clicks" Mobility   Outcome Measure  Help needed turning from your back to your side while in a flat bed without using bedrails?: A Little Help needed moving from lying on your back to sitting on the side of a flat bed without using bedrails?: A Little Help needed moving to and from a bed to a chair (including a wheelchair)?: A Little Help needed standing up from a chair using your arms (e.g., wheelchair or bedside chair)?: A Little Help needed to walk in hospital room?: A Little Help  needed climbing 3-5 steps with a railing? : A Little 6 Click Score: 18    End of Session Equipment Utilized During Treatment: Gait belt Activity Tolerance: Patient tolerated treatment well Patient left: with call bell/phone within reach;with family/visitor present;in bed;with nursing/sitter in room Nurse Communication: Mobility status PT Visit Diagnosis: Other abnormalities of gait and mobility (R26.89);Difficulty in walking, not elsewhere classified (R26.2)     Time: 1610-96041505-1547 PT Time Calculation (min) (ACUTE ONLY): 42 min  Charges:  $Gait Training: 8-22 mins $Therapeutic Exercise: 8-22 mins $Therapeutic Activity: 8-22 mins                     Mauro KaufmannHunter Aiyanah Kalama PT Acute Rehabilitation Services Pager [phone] Office [phone]    Kijuan Gallicchio 03/11/2018, 4:40 PM

## 2018-03-11 NOTE — Plan of Care (Signed)
  Problem: Health Behavior/Discharge Planning: Goal: Ability to manage health-related needs will improve Outcome: Progressing   Problem: Clinical Measurements: Goal: Ability to maintain clinical measurements within normal limits will improve Outcome: Progressing Goal: Will remain free from infection Outcome: Progressing Goal: Diagnostic test results will improve Outcome: Progressing Goal: Respiratory complications will improve Outcome: Progressing Goal: Cardiovascular complication will be avoided Outcome: Progressing   Problem: Activity: Goal: Risk for activity intolerance will decrease Outcome: Progressing   Problem: Nutrition: Goal: Adequate nutrition will be maintained Outcome: Progressing   Problem: Coping: Goal: Level of anxiety will decrease Outcome: Progressing   Problem: Elimination: Goal: Will not experience complications related to bowel motility Outcome: Progressing Goal: Will not experience complications related to urinary retention Outcome: Progressing   Problem: Pain Managment: Goal: General experience of comfort will improve Outcome: Progressing   Problem: Safety: Goal: Ability to remain free from injury will improve Outcome: Progressing   Problem: Skin Integrity: Goal: Risk for impaired skin integrity will decrease Outcome: Progressing   Problem: Education: Goal: Knowledge of the prescribed therapeutic regimen will improve Outcome: Progressing Goal: Individualized Educational Video(s) Outcome: Progressing   Problem: Activity: Goal: Ability to avoid complications of mobility impairment will improve Outcome: Progressing Goal: Range of joint motion will improve Outcome: Progressing   Problem: Clinical Measurements: Goal: Postoperative complications will be avoided or minimized Outcome: Progressing   Problem: Pain Management: Goal: Pain level will decrease with appropriate interventions Outcome: Progressing   Problem: Skin  Integrity: Goal: Will show signs of wound healing Outcome: Progressing   

## 2018-03-11 NOTE — Progress Notes (Signed)
Patient up with therapy feeling dizzy, placed in chair then back to bed. Bp with PT 47/29, placed in trendelenberg bp.Dr Shelle Ironbeane on floor notified with orders received.

## 2018-03-11 NOTE — Progress Notes (Addendum)
Subjective: 2 Days Post-Op Procedure(s) (LRB): TOTAL KNEE ARTHROPLASTY (Left) Patient reports pain as 2 on 0-10 scale.   No CP or SOB  Objective: Vital signs in last 24 hours: Temp:  [97.4 F (36.3 C)-97.8 F (36.6 C)] 97.4 F (36.3 C) (12/07 0623) Pulse Rate:  [48-62] 62 (12/07 0623) Resp:  [16] 16 (12/07 0623) BP: (93-112)/(62-74) 112/74 (12/07 0623) SpO2:  [97 %-100 %] 99 % (12/07 0623)  Intake/Output from previous day: 12/06 0701 - 12/07 0700 In: 1601 [P.O.:940; I.V.:661] Out: 720 [Urine:720] Intake/Output this shift: No intake/output data recorded.  Recent Labs    03/10/18 0631 03/11/18 0402  HGB 10.3* 8.7*   Recent Labs    03/10/18 0631 03/11/18 0402  WBC 14.2* 12.3*  RBC 3.45* 2.83*  HCT 32.5* 27.3*  PLT 237 195   Recent Labs    03/10/18 0631 03/11/18 0402  NA 137 138  K 4.7 4.8  CL 106 108  CO2 25 26  BUN 19 25*  CREATININE 0.84 0.92  GLUCOSE 110* 97  CALCIUM 8.4* 8.2*   No results for input(s): LABPT, INR in the last 72 hours.  Neurologically intact Neurovascular intact Sensation intact distally Intact pulses distally Dorsiflexion/Plantar flexion intact Incision: dressing C/D/I No cellulitis present Compartment soft  No DVT Mild swelling  Assessment/Plan: 2 Days Post-Op Procedure(s) (LRB): TOTAL KNEE ARTHROPLASTY (Left) Advance diet Up with therapy D/C IV fluids Discharge home with home health if OK in PT D/C instr given. Discussed with family. Asympt Anemia  Vasovagal episode, hypotensive Will add fluid bolus Hold on d/c today possible tomorrow Check H&H tomorrow 8.7 today If symptomatic today from anemia would consider transfusion  Stuart Flores 03/11/2018, 9:02 AM

## 2018-03-12 LAB — CBC WITH DIFFERENTIAL/PLATELET
Abs Immature Granulocytes: 0.04 10*3/uL (ref 0.00–0.07)
Basophils Absolute: 0 10*3/uL (ref 0.0–0.1)
Basophils Relative: 0 %
Eosinophils Absolute: 0.1 10*3/uL (ref 0.0–0.5)
Eosinophils Relative: 1 %
HEMATOCRIT: 28 % — AB (ref 39.0–52.0)
HEMOGLOBIN: 9 g/dL — AB (ref 13.0–17.0)
Immature Granulocytes: 0 %
LYMPHS ABS: 2 10*3/uL (ref 0.7–4.0)
LYMPHS PCT: 21 %
MCH: 30.9 pg (ref 26.0–34.0)
MCHC: 32.1 g/dL (ref 30.0–36.0)
MCV: 96.2 fL (ref 80.0–100.0)
Monocytes Absolute: 1.7 10*3/uL — ABNORMAL HIGH (ref 0.1–1.0)
Monocytes Relative: 17 %
NRBC: 0 % (ref 0.0–0.2)
Neutro Abs: 5.9 10*3/uL (ref 1.7–7.7)
Neutrophils Relative %: 61 %
Platelets: 187 10*3/uL (ref 150–400)
RBC: 2.91 MIL/uL — ABNORMAL LOW (ref 4.22–5.81)
RDW: 15.3 % (ref 11.5–15.5)
WBC: 9.8 10*3/uL (ref 4.0–10.5)

## 2018-03-12 NOTE — Progress Notes (Signed)
Subjective: 3 Days Post-Op Procedure(s) (LRB): TOTAL KNEE ARTHROPLASTY (Left)  Patient reports pain as mild to moderate.  Tolerating POs well. Admits to flatus.  Denies fever, chills, N/V, CP, SOB, light headedness.  Had vasovagal episode yesterday.  Reports that his back is painful, but that generally he feels better today than he did yesterday.  Hbg is up to 9.0.  Reports that he thinks he can go home today.  Objective:   VITALS:  Temp:  [98 F (36.7 C)-98.1 F (36.7 C)] 98.1 F (36.7 C) (12/08 0542) Pulse Rate:  [60-74] 74 (12/08 0542) Resp:  [16] 16 (12/08 0542) BP: (78-188)/(58-88) 125/88 (12/08 0542) SpO2:  [97 %-100 %] 98 % (12/08 0542)  General: WDWN patient in NAD. Psych:  Appropriate mood and affect. Neuro:  A&O x 3, Moving all extremities, sensation intact to light touch HEENT:  EOMs intact Chest:  Even non-labored respirations Skin:  Dressing C/D/I, no rashes or lesions Extremities: warm/dry, moderate edema, no erythema or echymosis.  No lymphadenopathy. Pulses: Popliteus 2+ MSK:  ROM: lacks 5 degrees of TKE, MMT: able to perform quad set, (-) Homan's    LABS Recent Labs    03/10/18 0631 03/11/18 0402 03/12/18 0453  HGB 10.3* 8.7* 9.0*  WBC 14.2* 12.3* 9.8  PLT 237 195 187   Recent Labs    03/10/18 0631 03/11/18 0402  NA 137 138  K 4.7 4.8  CL 106 108  CO2 25 26  BUN 19 25*  CREATININE 0.84 0.92  GLUCOSE 110* 97   No results for input(s): LABPT, INR in the last 72 hours.   Assessment/Plan: 3 Days Post-Op Procedure(s) (LRB): TOTAL KNEE ARTHROPLASTY (Left)  Patient seen in rounds for Dr. Kathleen Limelin WBAT L LE Up with therapy Plan for D/C home after working with therapy Scripts on chart Plan for outpatient post-op visit with Dr. Genevie Cheshirelin  Koriana Stepien PA-C [facility] Office:  [phone]

## 2018-03-12 NOTE — Progress Notes (Signed)
Physical Therapy Treatment Patient Details Name: Stuart HazyKenneth W Lindsley MRN: [id number] DOB: Oct 28, 1933 Today's Date: 03/12/2018    History of Present Illness 84 yo male s/p L TKR on 03/09/18. PMH includes R TKR 2017 with revision 11/2017, OA, angina, CAD, HLD, bilateral knee arthroscopies, coronary angioplasty with stent placement, cataracts.    PT Comments    Pt cooperative but with increased pain this am.  Pt completed therex program with increased time and assist.  Mobility deferred to after muscle relaxer at pt request.   Follow Up Recommendations  Follow surgeon's recommendation for DC plan and follow-up therapies;Supervision for mobility/OOB     Equipment Recommendations  None recommended by PT    Recommendations for Other Services       Precautions / Restrictions Precautions Precautions: Fall Restrictions Weight Bearing Restrictions: No Other Position/Activity Restrictions: WBAT     Mobility  Bed Mobility               General bed mobility comments: Pt up in chair  Transfers                 General transfer comment: deferred to after muscle relaxor  Ambulation/Gait                 Stairs             Wheelchair Mobility    Modified Rankin (Stroke Patients Only)       Balance                                            Cognition Arousal/Alertness: Awake/alert Behavior During Therapy: WFL for tasks assessed/performed Overall Cognitive Status: Within Functional Limits for tasks assessed                                 General Comments: Pt presented with some confusion during conversation at times, somewhat slow processing      Exercises Total Joint Exercises Ankle Circles/Pumps: AROM;Both;20 reps;Seated Quad Sets: AROM;Both;Supine;15 reps Heel Slides: AAROM;Left;15 reps;Supine Straight Leg Raises: AAROM;Left;15 reps;Supine Goniometric ROM: L knee AAROM -10 - 75    General Comments         Pertinent Vitals/Pain Pain Assessment: 0-10 Pain Score: 6  Pain Location: L knee and low back Pain Descriptors / Indicators: Sore Pain Intervention(s): Limited activity within patient's tolerance;Monitored during session;Premedicated before session;Ice applied;Patient requesting pain meds-RN notified    Home Living                      Prior Function            PT Goals (current goals can now be found in the care plan section) Acute Rehab PT Goals Patient Stated Goal: none stated  PT Goal Formulation: With patient Time For Goal Achievement: 03/16/18 Potential to Achieve Goals: Good Progress towards PT goals: Progressing toward goals    Frequency    7X/week      PT Plan Current plan remains appropriate    Co-evaluation              AM-PAC PT "6 Clicks" Mobility   Outcome Measure  Help needed turning from your back to your side while in a flat bed without using bedrails?: A Little Help needed moving from lying on your back to sitting on  the side of a flat bed without using bedrails?: A Little Help needed moving to and from a bed to a chair (including a wheelchair)?: A Little Help needed standing up from a chair using your arms (e.g., wheelchair or bedside chair)?: A Little Help needed to walk in hospital room?: A Little Help needed climbing 3-5 steps with a railing? : A Little 6 Click Score: 18    End of Session Equipment Utilized During Treatment: Gait belt Activity Tolerance: Patient tolerated treatment well Patient left: with call bell/phone within reach;with family/visitor present;in chair Nurse Communication: Mobility status PT Visit Diagnosis: Difficulty in walking, not elsewhere classified (R26.2)     Time: 1610-9604 PT Time Calculation (min) (ACUTE ONLY): 26 min  Charges:  $Therapeutic Exercise: 23-37 mins                     Mauro Kaufmann PT Acute Rehabilitation Services Pager [phone] Office  [phone]    Kayvon Mo 03/12/2018, 12:51 PM

## 2018-03-12 NOTE — Plan of Care (Signed)
  Problem: Health Behavior/Discharge Planning: Goal: Ability to manage health-related needs will improve Outcome: Progressing   Problem: Clinical Measurements: Goal: Ability to maintain clinical measurements within normal limits will improve Outcome: Progressing Goal: Will remain free from infection Outcome: Progressing Goal: Diagnostic test results will improve Outcome: Progressing Goal: Respiratory complications will improve Outcome: Progressing Goal: Cardiovascular complication will be avoided Outcome: Progressing   Problem: Activity: Goal: Risk for activity intolerance will decrease Outcome: Progressing   Problem: Nutrition: Goal: Adequate nutrition will be maintained Outcome: Progressing   Problem: Coping: Goal: Level of anxiety will decrease Outcome: Progressing   Problem: Elimination: Goal: Will not experience complications related to bowel motility Outcome: Progressing Goal: Will not experience complications related to urinary retention Outcome: Progressing   Problem: Pain Managment: Goal: General experience of comfort will improve Outcome: Progressing   Problem: Safety: Goal: Ability to remain free from injury will improve Outcome: Progressing   Problem: Skin Integrity: Goal: Risk for impaired skin integrity will decrease Outcome: Progressing   Problem: Education: Goal: Knowledge of the prescribed therapeutic regimen will improve Outcome: Progressing Goal: Individualized Educational Video(s) Outcome: Progressing   Problem: Activity: Goal: Ability to avoid complications of mobility impairment will improve Outcome: Progressing Goal: Range of joint motion will improve Outcome: Progressing   Problem: Clinical Measurements: Goal: Postoperative complications will be avoided or minimized Outcome: Progressing   Problem: Pain Management: Goal: Pain level will decrease with appropriate interventions Outcome: Progressing   Problem: Skin  Integrity: Goal: Will show signs of wound healing Outcome: Progressing   

## 2018-03-12 NOTE — Progress Notes (Signed)
Physical Therapy Treatment Patient Details Name: Stuart Flores MRN: [id number] DOB: 10/31/1933 Today's Date: 03/12/2018    History of Present Illness 84 yo male s/p L TKR on 03/09/18. PMH includes R TKR 2017 with revision 11/2017, OA, angina, CAD, HLD, bilateral knee arthroscopies, coronary angioplasty with stent placement, cataracts.    PT Comments    Pt continues to progress with mobility.  Spouse present to review stairs and sit<>stand transitions.   Follow Up Recommendations  Follow surgeon's recommendation for DC plan and follow-up therapies;Supervision for mobility/OOB     Equipment Recommendations  None recommended by PT    Recommendations for Other Services       Precautions / Restrictions Precautions Precautions: Fall Restrictions Weight Bearing Restrictions: No Other Position/Activity Restrictions: WBAT     Mobility  Bed Mobility               General bed mobility comments: Pt up in chair and requests back to same  Transfers Overall transfer level: Needs assistance Equipment used: Rolling walker (2 wheeled) Transfers: Sit to/from Stand Sit to Stand: Min assist;Min guard         General transfer comment: cues for R foot pulled back and use of UEs to self assist.  Repeated x 4 with spouse assisting to progress pt to CGA  Ambulation/Gait Ambulation/Gait assistance: Min guard;Supervision Gait Distance (Feet): 100 Feet Assistive device: Rolling walker (2 wheeled) Gait Pattern/deviations: Decreased step length - right;Decreased step length - left;Step-to pattern;Step-through pattern;Shuffle;Antalgic;Trunk flexed Gait velocity: decr    General Gait Details: cues for posture, position from RW and initial sequence   Stairs Stairs: Yes Stairs assistance: Min assist Stair Management: No rails;Two rails;Step to pattern;Forwards;With walker Number of Stairs: 7 General stair comments: 5 stairs with bil rails, single step fwd with RW; cues for sequence  and foot/RW placement.  Single step twice fwd with RW and cues for sequence and RW placement.  Spouse assisting    Wheelchair Mobility    Modified Rankin (Stroke Patients Only)       Balance Overall balance assessment: Mild deficits observed, not formally tested                                          Cognition Arousal/Alertness: Awake/alert Behavior During Therapy: WFL for tasks assessed/performed Overall Cognitive Status: Within Functional Limits for tasks assessed                                 General Comments: Pt presented with some confusion during conversation at times, somewhat slow processing      Exercises Total Joint Exercises Ankle Circles/Pumps: AROM;Both;20 reps;Seated Quad Sets: AROM;Both;Supine;15 reps Heel Slides: AAROM;Left;15 reps;Supine Straight Leg Raises: AAROM;Left;15 reps;Supine Goniometric ROM: L knee AAROM -10 - 75    General Comments        Pertinent Vitals/Pain Pain Assessment: 0-10 Pain Score: 4  Pain Location: L knee and low back Pain Descriptors / Indicators: Sore Pain Intervention(s): Limited activity within patient's tolerance;Monitored during session;Premedicated before session    Home Living                      Prior Function            PT Goals (current goals can now be found in the care plan section) Acute Rehab PT  Goals Patient Stated Goal: none stated  PT Goal Formulation: With patient Time For Goal Achievement: 03/16/18 Potential to Achieve Goals: Good Progress towards PT goals: Progressing toward goals    Frequency    7X/week      PT Plan Current plan remains appropriate    Co-evaluation              AM-PAC PT "6 Clicks" Mobility   Outcome Measure  Help needed turning from your back to your side while in a flat bed without using bedrails?: A Little Help needed moving from lying on your back to sitting on the side of a flat bed without using bedrails?: A  Little Help needed moving to and from a bed to a chair (including a wheelchair)?: A Little Help needed standing up from a chair using your arms (e.g., wheelchair or bedside chair)?: A Little Help needed to walk in hospital room?: A Little Help needed climbing 3-5 steps with a railing? : A Little 6 Click Score: 18    End of Session Equipment Utilized During Treatment: Gait belt Activity Tolerance: Patient tolerated treatment well Patient left: with call bell/phone within reach;with family/visitor present;in chair Nurse Communication: Mobility status PT Visit Diagnosis: Difficulty in walking, not elsewhere classified (R26.2)     Time: 1114-1140 PT Time Calculation (min) (ACUTE ONLY): 26 min  Charges:  $Gait Training: 8-22 mins $Therapeutic Exercise: 23-37 mins $Therapeutic Activity: 8-22 mins                     Mauro KaufmannHunter Graziella Connery PT Acute Rehabilitation Services Pager [phone] Office [phone]     Sanuel Ladnier 03/12/2018, 12:58 PM

## 2018-03-30 ENCOUNTER — Encounter (HOSPITAL_COMMUNITY): Payer: Self-pay | Admitting: Emergency Medicine

## 2018-03-30 ENCOUNTER — Emergency Department (HOSPITAL_COMMUNITY): Payer: Medicare Other

## 2018-03-30 ENCOUNTER — Observation Stay (HOSPITAL_COMMUNITY)
Admission: EM | Admit: 2018-03-30 | Discharge: 2018-04-02 | Disposition: A | Discharge status: Home / Self Care | Payer: Medicare Other | Attending: Internal Medicine | Admitting: Internal Medicine

## 2018-03-30 ENCOUNTER — Ambulatory Visit (HOSPITAL_BASED_OUTPATIENT_CLINIC_OR_DEPARTMENT_OTHER): Payer: Medicare Other

## 2018-03-30 DIAGNOSIS — Z79899 Other long term (current) drug therapy: Secondary | ICD-10-CM | POA: Insufficient documentation

## 2018-03-30 DIAGNOSIS — Z7901 Long term (current) use of anticoagulants: Secondary | ICD-10-CM | POA: Diagnosis not present

## 2018-03-30 DIAGNOSIS — M79605 Pain in left leg: Secondary | ICD-10-CM | POA: Diagnosis present

## 2018-03-30 DIAGNOSIS — Z96652 Presence of left artificial knee joint: Secondary | ICD-10-CM

## 2018-03-30 DIAGNOSIS — R609 Edema, unspecified: Secondary | ICD-10-CM

## 2018-03-30 DIAGNOSIS — E785 Hyperlipidemia, unspecified: Secondary | ICD-10-CM | POA: Diagnosis not present

## 2018-03-30 DIAGNOSIS — M7989 Other specified soft tissue disorders: Secondary | ICD-10-CM

## 2018-03-30 DIAGNOSIS — Z87891 Personal history of nicotine dependence: Secondary | ICD-10-CM | POA: Diagnosis not present

## 2018-03-30 DIAGNOSIS — I480 Paroxysmal atrial fibrillation: Secondary | ICD-10-CM | POA: Diagnosis not present

## 2018-03-30 DIAGNOSIS — E871 Hypo-osmolality and hyponatremia: Secondary | ICD-10-CM | POA: Diagnosis not present

## 2018-03-30 DIAGNOSIS — I2510 Atherosclerotic heart disease of native coronary artery without angina pectoris: Secondary | ICD-10-CM | POA: Insufficient documentation

## 2018-03-30 DIAGNOSIS — Z955 Presence of coronary angioplasty implant and graft: Secondary | ICD-10-CM | POA: Insufficient documentation

## 2018-03-30 DIAGNOSIS — G9340 Encephalopathy, unspecified: Principal | ICD-10-CM | POA: Diagnosis present

## 2018-03-30 DIAGNOSIS — R4182 Altered mental status, unspecified: Secondary | ICD-10-CM

## 2018-03-30 DIAGNOSIS — D649 Anemia, unspecified: Secondary | ICD-10-CM | POA: Diagnosis not present

## 2018-03-30 LAB — COMPREHENSIVE METABOLIC PANEL
ALT: 19 U/L (ref 0–44)
ANION GAP: 11 (ref 5–15)
AST: 27 U/L (ref 15–41)
Albumin: 3.9 g/dL (ref 3.5–5.0)
Alkaline Phosphatase: 61 U/L (ref 38–126)
BILIRUBIN TOTAL: 0.9 mg/dL (ref 0.3–1.2)
BUN: 18 mg/dL (ref 8–23)
CO2: 19 mmol/L — ABNORMAL LOW (ref 22–32)
Calcium: 9.2 mg/dL (ref 8.9–10.3)
Chloride: 102 mmol/L (ref 98–111)
Creatinine, Ser: 0.94 mg/dL (ref 0.61–1.24)
GFR calc Af Amer: >60 mL/min (ref >60)
GFR calc non Af Amer: >60 mL/min (ref >60)
Glucose, Bld: 95 mg/dL (ref 70–99)
Potassium: 4.4 mmol/L (ref 3.5–5.1)
Sodium: 132 mmol/L — ABNORMAL LOW (ref 135–145)
Total Protein: 5.9 g/dL — ABNORMAL LOW (ref 6.5–8.1)

## 2018-03-30 LAB — CBC
HCT: 32 % — ABNORMAL LOW (ref 39.0–52.0)
Hemoglobin: 10.6 g/dL — ABNORMAL LOW (ref 13.0–17.0)
MCH: 29.5 pg (ref 26.0–34.0)
MCHC: 33.1 g/dL (ref 30.0–36.0)
MCV: 89.1 fL (ref 80.0–100.0)
NRBC: 0 % (ref 0.0–0.2)
Platelets: 491 10*3/uL — ABNORMAL HIGH (ref 150–400)
RBC: 3.59 MIL/uL — ABNORMAL LOW (ref 4.22–5.81)
RDW: 13.8 % (ref 11.5–15.5)
WBC: 8.3 10*3/uL (ref 4.0–10.5)

## 2018-03-30 LAB — TROPONIN I: Troponin I: <0.03 ng/mL (ref <0.03)

## 2018-03-30 LAB — URINALYSIS, ROUTINE W REFLEX MICROSCOPIC
Bilirubin Urine: NEGATIVE
Glucose, UA: NEGATIVE mg/dL
Hgb urine dipstick: NEGATIVE
Ketones, ur: 5 mg/dL — AB
LEUKOCYTES UA: NEGATIVE
NITRITE: NEGATIVE
Protein, ur: NEGATIVE mg/dL
Specific Gravity, Urine: 1.019 (ref 1.005–1.030)
pH: 8 (ref 5.0–8.0)

## 2018-03-30 LAB — I-STAT CG4 LACTIC ACID, ED: Lactic Acid, Venous: 1.43 mmol/L (ref 0.5–1.9)

## 2018-03-30 MED ORDER — ONDANSETRON HCL 4 MG/2ML IJ SOLN
4.0000 mg | Freq: Once | INTRAMUSCULAR | Status: DC
  Filled 2018-03-30: qty 2

## 2018-03-30 MED ORDER — LORAZEPAM 2 MG/ML IJ SOLN
1.0000 mg | Freq: Once | INTRAMUSCULAR | Status: AC
  Administered 2018-03-30: 1 mg via INTRAVENOUS
  Filled 2018-03-30: qty 1

## 2018-03-30 MED ORDER — MORPHINE SULFATE (PF) 4 MG/ML IV SOLN
4.0000 mg | Freq: Once | INTRAVENOUS | Status: DC
  Filled 2018-03-30: qty 1

## 2018-03-30 MED ORDER — OXYCODONE-ACETAMINOPHEN 5-325 MG PO TABS
1.0000 | ORAL_TABLET | Freq: Once | ORAL | Status: AC
  Administered 2018-03-30: 1 via ORAL
  Filled 2018-03-30: qty 1

## 2018-03-30 MED ORDER — SODIUM CHLORIDE 0.9 % IV BOLUS
1000.0000 mL | Freq: Once | INTRAVENOUS | Status: AC
  Administered 2018-03-30: 1000 mL via INTRAVENOUS

## 2018-03-30 NOTE — ED Notes (Signed)
Patient transported to X-ray 

## 2018-03-30 NOTE — ED Provider Notes (Signed)
[facility]-EMERGENCY DEPT Provider Note   CSN: [id number] Arrival date & time: 03/30/18  1629     History   Chief Complaint Chief Complaint  Patient presents with  . Altered Mental Status    HPI Stuart Flores is a 84 y.o. male.  Pt presents to the ED today with [facility].  Pt's wife said he woke up normally this morning.  He then started getting confused and went to take a nap.  His wife said he slept for about 1 hour and seemed to be ok.  He had a left knee replacement done on 12/5 by Dr. Charlann Boxer.  He was due to get PT today and wanted to go, so wife brought him there.  When he arrived, he became confused again.  He was sent to the ED from ortho.  The pt c/o left knee pain.  He is very confused and is answering questions very slowly.  This is not normal according to the wife.  She gives the hx.  Pt was put on Eliquis post-op.     Past Medical History:  Diagnosis Date  . Arthritis    ostearthritis- back, knee  . BPH with obstruction/lower urinary tract symptoms    urologist-  dr r. evans @[facility]   . Bradycardia   . Coronary artery disease    cardiologist-- dr Jacinto Halim  . History of glaucoma    per pt no issues since cataract extraction's bilaterally 2015  . History of squamous cell carcinoma excision    face x2  . PAF (paroxysmal atrial fibrillation) [facility])    cardiologist-- dr Jacinto Halim  . S/P drug eluting coronary stent placement 03/04/2015   DES x1 to mid CFx and DES x1 to mid LAD  . Wears glasses   . Wears hearing aid in both ears     Patient Active Problem List   Diagnosis Date Noted  . S/P left TKA 03/09/2018  . S/P knee replacement 03/09/2018  . 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 OA (osteoarthritis) of knee 10/27/2015  . Post PTCA 03/04/2015  . Angina effort 03/03/2015  . Hyperlipidemia 03/03/2015    Past Surgical History:  Procedure Laterality Date  . CARDIAC CATHETERIZATION N/A 03/04/2015   Procedure: Left Heart Cath and Coronary  Angiography;  Surgeon: Yates Decamp, MD;  Location: [facility] INVASIVE CV LAB;  Service: Cardiovascular;  Laterality: N/A;  . CARDIAC CATHETERIZATION  03/04/2015   Procedure: Coronary Stent Intervention;  Surgeon: Yates Decamp, MD;  Location: [facility] INVASIVE CV LAB;  Service: Cardiovascular;;  DES to mCFx and mLAD  . CARDIOVASCULAR STRESS TEST  12-27-2016   dr Jacinto Halim   Low risk nuclear study w/ no ischemia/  normal LV function and wall function, ef 67%  . CATARACT EXTRACTION W/ INTRAOCULAR LENS  IMPLANT, BILATERAL Bilateral 2015  . HAND SURGERY Left early 2000s   "thumb surgery for Tendon pair"  . KNEE ARTHROSCOPY Bilateral   . STERIOD INJECTION Left 11/24/2017   Procedure: LEFT KNEE CORTISONE INJECTION;  Surgeon: Durene Romans, MD;  Location: [facility];  Service: Orthopedics;  Laterality: Left;  . TONSILLECTOMY  child  . TOTAL KNEE ARTHROPLASTY Right 10/27/2015   Procedure: TOTAL RIGHT KNEE ARTHROPLASTY;  Surgeon: Ollen Gross, MD;  Location: [facility];  Service: Orthopedics;  Laterality: Right;  . TOTAL KNEE ARTHROPLASTY Left 03/09/2018   Procedure: TOTAL KNEE ARTHROPLASTY;  Surgeon: Durene Romans, MD;  Location: [facility];  Service: Orthopedics;  Laterality: Left;  70 mins with block  . TOTAL  KNEE REVISION WITH SCAR DEBRIDEMENT/PATELLA REVISION WITH POLY EXCHANGE Right 11/24/2017   Procedure: RIGHT KNEE OPEN ARTHROTOMY WITH SCAR DEBRIDEMENT AND POSSIBLE POLY EXCHANGE;  Surgeon: Durene Romans, MD;  Location: [facility];  Service: Orthopedics;  Laterality: Right;  90 mins  . TRANSTHORACIC ECHOCARDIOGRAM  01-20-2017   dr Jacinto Halim   mild concentric LVH, ef 55%/  trace AR/  mild MR, TR and  PR        Home Medications    Prior to Admission medications   Medication Sig Start Date End Date Taking? Authorizing Provider  alfuzosin (UROXATRAL) 10 MG 24 hr tablet Take 10 mg by mouth at bedtime.    Yes [provider]  apixaban (ELIQUIS) 5 MG TABS tablet Take 1 tablet (5 mg total) by mouth 2 (two) times daily. 12/09/16  Yes  Newman Nip, NP  atorvastatin (LIPITOR) 40 MG tablet Take 40 mg by mouth daily. In the morning 03/03/15  Yes [provider]  HYDROcodone-acetaminophen (NORCO) 7.5-325 MG tablet Take 1-2 tablets by mouth every 4 (four) hours as needed for moderate pain. Patient taking differently: Take 0.5-1 tablets by mouth every 4 (four) hours as needed for moderate pain.  03/10/18  Yes Babish, Molli Hazard, PA-C  methocarbamol (ROBAXIN) 500 MG tablet Take 1 tablet (500 mg total) by mouth every 6 (six) hours as needed for muscle spasms. 03/10/18  Yes Babish, Molli Hazard, PA-C  metoprolol succinate (TOPROL-XL) 50 MG 24 hr tablet Take 25 mg by mouth daily with breakfast.  02/25/15  Yes [provider]  Multiple Vitamin (MULTIVITAMIN WITH MINERALS) TABS tablet Take 1 tablet by mouth daily.   Yes [provider]  nitroGLYCERIN (NITROSTAT) 0.4 MG SL tablet Place 0.4 mg under the tongue every 5 (five) minutes as needed for chest pain (No more than 3 doses).  02/25/15  Yes [provider]  polyethylene glycol (MIRALAX / GLYCOLAX) packet Take 17 g by mouth 2 (two) times daily. Patient taking differently: Take 17 g by mouth daily.  03/10/18  Yes Babish, Molli Hazard, PA-C  Probiotic Product (PROBIOTIC DAILY PO) Take 1 capsule by mouth daily.   Yes [provider]  docusate sodium (COLACE) 100 MG capsule Take 1 capsule (100 mg total) by mouth 2 (two) times daily. Patient not taking: Reported on 03/30/2018 03/10/18   Lanney Gins, PA-C  ferrous sulfate (FERROUSUL) 325 (65 FE) MG tablet Take 1 tablet (325 mg total) by mouth 3 (three) times daily with meals. Patient not taking: Reported on 03/30/2018 03/10/18   Lanney Gins, PA-C    Family History No family history on file.  Social History Social History   Tobacco Use  . Smoking status: Former Smoker    Packs/day: 1.00    Years: 9.00    Pack years: 9.00    Types: Cigarettes  . Smokeless tobacco: Never Used  . Tobacco comment:  stopped smoking cigarettes at age 21  Substance Use Topics  . Alcohol use: Yes    Alcohol/week: 9.0 standard drinks    Types: 9 Glasses of wine per week    Comment: weekly  . Drug use: Never     Allergies   Patient has no known allergies.   Review of Systems Review of Systems  Unable to perform ROS: Mental status change     Physical Exam Updated Vital Signs BP 110/61   Pulse 92   Temp 98.7 F (37.1 C) (Rectal)   Resp 16   SpO2 95%   Physical Exam Vitals signs and nursing  note reviewed.  Constitutional:      Appearance: Normal appearance. He is normal weight.  HENT:     Head: Normocephalic and atraumatic.     Right Ear: External ear normal.     Left Ear: External ear normal.     Nose: Nose normal.     Mouth/Throat:     Mouth: Mucous membranes are dry.  Eyes:     Pupils: Pupils are equal, round, and reactive to light.  Neck:     Musculoskeletal: Normal range of motion and neck supple.  Cardiovascular:     Rate and Rhythm: Regular rhythm. Tachycardia present.     Pulses: Normal pulses.     Heart sounds: Normal heart sounds.  Pulmonary:     Effort: Pulmonary effort is normal.     Breath sounds: Normal breath sounds.  Abdominal:     General: Abdomen is flat. Bowel sounds are normal.     Palpations: Abdomen is soft.  Musculoskeletal:     Comments: Left knee incision healing well.  No redness or drainage.    Left leg much more swollen than right.  Skin:    General: Skin is warm.     Capillary Refill: Capillary refill takes less than 2 seconds.  Neurological:     General: No focal deficit present.     Mental Status: He is alert. He is confused.     Motor: Motor function is intact.  Psychiatric:     Comments: Unable to assess due to ms change      ED Treatments / Results  Labs (all labs ordered are listed, but only abnormal results are displayed) Labs Reviewed  COMPREHENSIVE METABOLIC PANEL - Abnormal; Notable for the following components:       Result Value   Sodium 132 (*)    CO2 19 (*)    Total Protein 5.9 (*)    All other components within normal limits  CBC - Abnormal; Notable for the following components:   RBC 3.59 (*)    Hemoglobin 10.6 (*)    HCT 32.0 (*)    Platelets 491 (*)    All other components within normal limits  URINALYSIS, ROUTINE W REFLEX MICROSCOPIC - Abnormal; Notable for the following components:   APPearance CLOUDY (*)    Ketones, ur 5 (*)    All other components within normal limits  URINE CULTURE  CULTURE, BLOOD (ROUTINE X 2)  CULTURE, BLOOD (ROUTINE X 2)  TROPONIN I  CBG MONITORING, ED  I-STAT CG4 LACTIC ACID, ED    EKG None  Radiology Dg Chest 2 View  Result Date: 03/30/2018 CLINICAL DATA:  Confusion EXAM: CHEST - 2 VIEW COMPARISON:  05/16/2003 FINDINGS: Heart is normal size. Tortuous aorta. Lungs clear. No effusions or pneumothorax. No acute bony abnormality. IMPRESSION: No active cardiopulmonary disease. Electronically Signed   By: Charlett NoseKevin  Dover M.D.   On: 03/30/2018 18:08   Ct Head Wo Contrast  Result Date: 03/30/2018 CLINICAL DATA:  Altered level of consciousness. EXAM: CT HEAD WITHOUT CONTRAST TECHNIQUE: Contiguous axial images were obtained from the base of the skull through the vertex without intravenous contrast. COMPARISON:  None. FINDINGS: Brain: There is atrophy and chronic small vessel disease changes. No acute intracranial abnormality. Specifically, no hemorrhage, hydrocephalus, mass lesion, acute infarction, or significant intracranial injury. Vascular: No hyperdense vessel or unexpected calcification. Skull: No acute calvarial abnormality. Sinuses/Orbits: Visualized paranasal sinuses and mastoids clear. Orbital soft tissues unremarkable. Other: None IMPRESSION: Atrophy, chronic microvascular disease. No acute intracranial abnormality.  Electronically Signed   By: Charlett Nose M.D.   On: 03/30/2018 18:00   Vas [place] Lower Extremity Venous (dvt) (only [facility])  Result Date:  03/30/2018  Lower Venous Study Indications: Swelling, and Edema.  Limitations: Pain tolerance. Performing Technologist: Blanch Media RVS  Examination Guidelines: A complete evaluation includes B-mode imaging, spectral Doppler, color Doppler, and power Doppler as needed of all accessible portions of each vessel. Bilateral testing is considered an integral part of a complete examination. Limited examinations for reoccurring indications may be performed as noted.  Left Venous Findings: +---------+---------------+---------+-----------+----------+-------------------+          CompressibilityPhasicitySpontaneityPropertiesSummary             +---------+---------------+---------+-----------+----------+-------------------+ CFV      Full           Yes      Yes                                      +---------+---------------+---------+-----------+----------+-------------------+ SFJ      Full                                                             +---------+---------------+---------+-----------+----------+-------------------+ FV Prox  Full                                                             +---------+---------------+---------+-----------+----------+-------------------+ FV Mid   Full                                                             +---------+---------------+---------+-----------+----------+-------------------+ FV Distal               Yes      Yes                  unable to compress                                                        due to pain                                                               tolerance           +---------+---------------+---------+-----------+----------+-------------------+ PFV      Full                                                             +---------+---------------+---------+-----------+----------+-------------------+  POP      Full           Yes      Yes                                       +---------+---------------+---------+-----------+----------+-------------------+ PTV      Full                                                             +---------+---------------+---------+-----------+----------+-------------------+ PERO                                                  Not visualized      +---------+---------------+---------+-----------+----------+-------------------+    Summary: Left: There is no evidence of deep vein thrombosis in the lower extremity. No cystic structure found in the popliteal fossa.  *See table(s) above for measurements and observations.    Preliminary     Procedures Procedures (including critical care time)  Medications Ordered in ED Medications  ondansetron (ZOFRAN) injection 4 mg (0 mg Intravenous Hold 03/30/18 1832)  morphine 4 MG/ML injection 4 mg (4 mg Intravenous Refused 03/30/18 1814)  sodium chloride 0.9 % bolus 1,000 mL (0 mLs Intravenous Stopped 03/30/18 1832)  oxyCODONE-acetaminophen (PERCOCET/ROXICET) 5-325 MG per tablet 1 tablet (1 tablet Oral Given 03/30/18 1831)  LORazepam (ATIVAN) injection 1 mg (1 mg Intravenous Given 03/30/18 2152)     Initial Impression / Assessment and Plan / ED Course  I have reviewed the triage vital signs and the nursing notes.  Pertinent labs & imaging results that were available during my care of the patient were reviewed by me and considered in my medical decision making (see chart for details).    Pt has remained confused while here.  He has been agitated and kept pulling at his monitor leads.  He required ativan IV to calm him down.  No signs of infection.  No DVT in left leg.  The pt was d/w Dr. Toniann FailKakrakandy ([facility]) for admission.  Final Clinical Impressions(s) / ED Diagnoses   Final diagnoses:  Altered mental status, unspecified altered mental status type  Left leg pain    ED Discharge Orders    None       Jacalyn LefevreHaviland, Avacyn Kloosterman, MD 03/30/18 2236

## 2018-03-30 NOTE — ED Triage Notes (Signed)
[facility] due to sudden onset of confusion today. Wife reports that he is slow to respond and does not follow commands per norm. Left knee replacement 3 weeks ago.

## 2018-03-30 NOTE — ED Notes (Signed)
Urine request made 

## 2018-03-30 NOTE — ED Notes (Signed)
Pt and pt's visitor reminded that a urine sample is still needed.

## 2018-03-30 NOTE — Progress Notes (Signed)
*  Preliminary Results* Left lower extremity venous duplex completed. Left lower extremity is negative for deep vein thrombosis. There is no evidence of left Baker's cyst.  Results given to Schering-PloughCrystal, RN.   03/30/2018 5:57 PM  Blanch MediaMegan Eliott Amparan

## 2018-03-30 NOTE — ED Notes (Signed)
Pt very agitated with staff while attempting to place pt's gown back on and connecting pt back to monitor.

## 2018-03-30 NOTE — ED Notes (Signed)
This RN only obtained blue top blood culture for second set of culture due to pt inability to tolerate blood draw. Pt pulling away from RN during blood draw.

## 2018-03-30 NOTE — ED Notes (Signed)
Mr Audrie LiaFrasier picking and pulling at equipt and taking off clothing also attempting to get OOB. Pt unable to follow simple directions and continues to no follow redirection. EDP notified orders obtained

## 2018-03-30 NOTE — ED Notes (Signed)
Agitation returning medicated per order.

## 2018-03-31 ENCOUNTER — Other Ambulatory Visit: Payer: Self-pay

## 2018-03-31 ENCOUNTER — Encounter (HOSPITAL_COMMUNITY): Payer: Self-pay

## 2018-03-31 ENCOUNTER — Observation Stay (HOSPITAL_COMMUNITY): Payer: Medicare Other

## 2018-03-31 DIAGNOSIS — G9340 Encephalopathy, unspecified: Secondary | ICD-10-CM | POA: Diagnosis not present

## 2018-03-31 DIAGNOSIS — I2510 Atherosclerotic heart disease of native coronary artery without angina pectoris: Secondary | ICD-10-CM | POA: Diagnosis present

## 2018-03-31 LAB — HEPATIC FUNCTION PANEL
ALT: 16 U/L (ref 0–44)
AST: 29 U/L (ref 15–41)
Albumin: 3.4 g/dL — ABNORMAL LOW (ref 3.5–5.0)
Alkaline Phosphatase: 52 U/L (ref 38–126)
Bilirubin, Direct: 0.2 mg/dL (ref 0.0–0.2)
Indirect Bilirubin: 0.6 mg/dL (ref 0.3–0.9)
Total Bilirubin: 0.8 mg/dL (ref 0.3–1.2)
Total Protein: 5.4 g/dL — ABNORMAL LOW (ref 6.5–8.1)

## 2018-03-31 LAB — CBC WITH DIFFERENTIAL/PLATELET
Abs Immature Granulocytes: 0.02 10*3/uL (ref 0.00–0.07)
Basophils Absolute: 0.1 10*3/uL (ref 0.0–0.1)
Basophils Relative: 1 %
EOS ABS: 0.1 10*3/uL (ref 0.0–0.5)
Eosinophils Relative: 2 %
HCT: 29.9 % — ABNORMAL LOW (ref 39.0–52.0)
Hemoglobin: 9.5 g/dL — ABNORMAL LOW (ref 13.0–17.0)
IMMATURE GRANULOCYTES: 0 %
Lymphocytes Relative: 24 %
Lymphs Abs: 1.7 10*3/uL (ref 0.7–4.0)
MCH: 29.8 pg (ref 26.0–34.0)
MCHC: 31.8 g/dL (ref 30.0–36.0)
MCV: 93.7 fL (ref 80.0–100.0)
Monocytes Absolute: 1.2 10*3/uL — ABNORMAL HIGH (ref 0.1–1.0)
Monocytes Relative: 16 %
Neutro Abs: 4.2 10*3/uL (ref 1.7–7.7)
Neutrophils Relative %: 57 %
Platelets: 364 10*3/uL (ref 150–400)
RBC: 3.19 MIL/uL — ABNORMAL LOW (ref 4.22–5.81)
RDW: 14 % (ref 11.5–15.5)
WBC: 7.3 10*3/uL (ref 4.0–10.5)
nRBC: 0 % (ref 0.0–0.2)

## 2018-03-31 LAB — BASIC METABOLIC PANEL
Anion gap: 8 (ref 5–15)
BUN: 15 mg/dL (ref 8–23)
CO2: 19 mmol/L — ABNORMAL LOW (ref 22–32)
CREATININE: 0.78 mg/dL (ref 0.61–1.24)
Calcium: 8.1 mg/dL — ABNORMAL LOW (ref 8.9–10.3)
Chloride: 104 mmol/L (ref 98–111)
GFR calc non Af Amer: >60 mL/min (ref >60)
Glucose, Bld: 90 mg/dL (ref 70–99)
Potassium: 3.8 mmol/L (ref 3.5–5.1)
Sodium: 131 mmol/L — ABNORMAL LOW (ref 135–145)

## 2018-03-31 LAB — PROTIME-INR
INR: 1.33
Prothrombin Time: 16.3 seconds — ABNORMAL HIGH (ref 11.4–15.2)

## 2018-03-31 LAB — GLUCOSE, CAPILLARY
GLUCOSE-CAPILLARY: 84 mg/dL (ref 70–99)
GLUCOSE-CAPILLARY: 87 mg/dL (ref 70–99)
Glucose-Capillary: 105 mg/dL — ABNORMAL HIGH (ref 70–99)

## 2018-03-31 LAB — RAPID URINE DRUG SCREEN, HOSP PERFORMED
Amphetamines: NOT DETECTED
Barbiturates: NOT DETECTED
Benzodiazepines: POSITIVE — AB
Cocaine: NOT DETECTED
Opiates: POSITIVE — AB
Tetrahydrocannabinol: NOT DETECTED

## 2018-03-31 LAB — HEPARIN LEVEL (UNFRACTIONATED): HEPARIN UNFRACTIONATED: 1.76 [IU]/mL — AB (ref 0.30–0.70)

## 2018-03-31 LAB — APTT: aPTT: 40 seconds — ABNORMAL HIGH (ref 24–36)

## 2018-03-31 LAB — TROPONIN I: Troponin I: <0.03 ng/mL (ref <0.03)

## 2018-03-31 LAB — SEDIMENTATION RATE: SED RATE: 10 mm/h (ref 0–16)

## 2018-03-31 LAB — MRSA PCR SCREENING: MRSA by PCR: NEGATIVE

## 2018-03-31 LAB — AMMONIA: AMMONIA: 21 umol/L (ref 9–35)

## 2018-03-31 MED ORDER — ACETAMINOPHEN 325 MG PO TABS: 650.0000 mg | ORAL_TABLET | Freq: Four times a day (QID) | ORAL | Status: DC | PRN

## 2018-03-31 MED ORDER — HEPARIN (PORCINE) 25000 UT/250ML-% IV SOLN
1100.0000 [IU]/h | INTRAVENOUS | Status: DC
  Administered 2018-03-31: 1100 [IU]/h via INTRAVENOUS
  Filled 2018-03-31: qty 250

## 2018-03-31 MED ORDER — DEXTROSE 5 % IV SOLN
10.0000 mg/kg | Freq: Three times a day (TID) | INTRAVENOUS | Status: DC
  Administered 2018-03-31: 760 mg via INTRAVENOUS
  Filled 2018-03-31 (×2): qty 15.2

## 2018-03-31 MED ORDER — ONDANSETRON HCL 4 MG/2ML IJ SOLN: 4.0000 mg | Freq: Four times a day (QID) | INTRAMUSCULAR | Status: DC | PRN

## 2018-03-31 MED ORDER — LORAZEPAM 2 MG/ML IJ SOLN: 1.0000 mg | INTRAMUSCULAR | Status: DC | PRN

## 2018-03-31 MED ORDER — DESMOPRESSIN ACETATE 0.1 MG PO TABS
0.1000 mg | ORAL_TABLET | Freq: Every day | ORAL | Status: DC
  Administered 2018-03-31: 0.1 mg via ORAL
  Filled 2018-03-31: qty 1

## 2018-03-31 MED ORDER — METOPROLOL TARTRATE 5 MG/5ML IV SOLN
2.5000 mg | Freq: Three times a day (TID) | INTRAVENOUS | Status: DC
  Administered 2018-03-31: 2.5 mg via INTRAVENOUS
  Filled 2018-03-31 (×3): qty 5

## 2018-03-31 MED ORDER — ACETAMINOPHEN 650 MG RE SUPP: 650.0000 mg | Freq: Four times a day (QID) | RECTAL | Status: DC | PRN

## 2018-03-31 MED ORDER — ATORVASTATIN CALCIUM 40 MG PO TABS
40.0000 mg | ORAL_TABLET | Freq: Every day | ORAL | Status: DC
  Administered 2018-03-31 – 2018-04-02 (×3): 40 mg via ORAL
  Filled 2018-03-31 (×3): qty 1

## 2018-03-31 MED ORDER — NITROGLYCERIN 0.4 MG SL SUBL: 0.4000 mg | SUBLINGUAL_TABLET | SUBLINGUAL | Status: DC | PRN

## 2018-03-31 MED ORDER — SODIUM CHLORIDE 0.9 % IV SOLN
INTRAVENOUS | Status: DC
  Administered 2018-03-31: 09:00:00 via INTRAVENOUS

## 2018-03-31 MED ORDER — DOCUSATE SODIUM 100 MG PO CAPS
100.0000 mg | ORAL_CAPSULE | Freq: Two times a day (BID) | ORAL | Status: DC
  Administered 2018-03-31 – 2018-04-02 (×4): 100 mg via ORAL
  Filled 2018-03-31 (×5): qty 1

## 2018-03-31 MED ORDER — FERROUS SULFATE 325 (65 FE) MG PO TABS
325.0000 mg | ORAL_TABLET | Freq: Three times a day (TID) | ORAL | Status: DC
  Administered 2018-03-31 – 2018-04-01 (×2): 325 mg via ORAL
  Filled 2018-03-31 (×2): qty 1

## 2018-03-31 MED ORDER — SODIUM CHLORIDE 0.9 % IV SOLN
INTRAVENOUS | Status: DC
  Administered 2018-03-31: 04:00:00 via INTRAVENOUS

## 2018-03-31 MED ORDER — SODIUM CHLORIDE 0.9 % IV SOLN
INTRAVENOUS | Status: DC
  Administered 2018-03-31 – 2018-04-02 (×4): via INTRAVENOUS

## 2018-03-31 MED ORDER — ONDANSETRON HCL 4 MG PO TABS: 4.0000 mg | ORAL_TABLET | Freq: Four times a day (QID) | ORAL | Status: DC | PRN

## 2018-03-31 MED ORDER — ALFUZOSIN HCL ER 10 MG PO TB24
10.0000 mg | ORAL_TABLET | Freq: Every day | ORAL | Status: DC
  Administered 2018-04-01: 10 mg via ORAL
  Filled 2018-03-31 (×2): qty 1

## 2018-03-31 MED ORDER — LORAZEPAM 2 MG/ML IJ SOLN: 0.5000 mg | INTRAMUSCULAR | Status: AC | PRN

## 2018-03-31 MED ORDER — APIXABAN 5 MG PO TABS
5.0000 mg | ORAL_TABLET | Freq: Two times a day (BID) | ORAL | Status: DC
  Administered 2018-03-31 – 2018-04-02 (×4): 5 mg via ORAL
  Filled 2018-03-31 (×5): qty 1

## 2018-03-31 NOTE — Progress Notes (Signed)
[facility] PLAN OF CARE NOTE Patient: Stuart HazyKenneth W Flores ZOX:[id number]   PCP: Chilton GreathouseAvva, Ravisankar, MD DOB: [birth year]   DOA: 03/30/2018   DOS: 03/31/2018    Patient was admitted by my colleague Dr. Toniann FailKakrakandy  earlier on 03/31/2018. I have reviewed the H&P as well as assessment and plan and agree with the same. Important changes in the plan are listed below.  Plan of care: Principal Problem:   Acute encephalopathy Active Problems:   Hyperlipidemia   S/P left TKA   CAD (coronary artery disease)  Likely from polypharmacy. Discontinue acyclovir. Resume home regimen. Able to tolerate oral diet without any aspiration. Transfer out of the stepdown unit. MRI negative for stroke.  Author: Lynden OxfordPranav Imani Sherrin, MD [facility] Pager: [phone] 03/31/2018 6:18 PM   If 7PM-7AM, please contact night-coverage at [email or web address], password [email or web address]

## 2018-03-31 NOTE — Progress Notes (Addendum)
Pharmacy Antibiotic and Anticoagulation Note  Caren HazyKenneth W Czerwonka is a 84 y.o. 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  Pharmacy has been consulted for acyclovir dosing.  IV heparin for a-fib, on PTA eliquis. LD 12/26  Plan: Acyclovir 760 mg IV q8h F/u scr Baseline aPtt, PT/INR and HL STAT Heparin 1100 units/hr (No bolus) Will use aPtt to monitor d/t recent apixaban use Check 1st aPtt in 8 hours Daily CBC/HL, use aPtt until aptt and HL correlate   Height: 6' (182.9 cm) IBW/kg (Calculated) : 77.6  Temp (24hrs), Avg:98.1 F (36.7 C), Min:97.4 F (36.3 C), Max:98.7 F (37.1 C)  Recent Labs  Lab 03/30/18 1702 03/30/18 1712  WBC 8.3  --   CREATININE 0.94  --   LATICACIDVEN  --  1.43    CrCl cannot be calculated (Unknown ideal weight.).    No Known Allergies  Antimicrobials this admission: 12/27 acyclovir >>    >>   Dose adjustments this admission:   Microbiology results:  BCx:   UCx:    Sputum:    MRSA PCR:   Thank you for allowing pharmacy to be a part of this patient's care.  Lorenza EvangelistGreen, Mallerie Blok R 03/31/2018 3:34 AM

## 2018-03-31 NOTE — ED Notes (Signed)
Mr Stuart Flores report called to floor

## 2018-03-31 NOTE — ED Notes (Signed)
Family came to desk and states patient is agitated and requesting more medication-Bobby, primary RN aware

## 2018-03-31 NOTE — Progress Notes (Signed)
ANTICOAGULATION CONSULT NOTE - Initial Consult  Pharmacy Consult for Eliquis Indication: atrial fibrillation  No Known Allergies  Patient Measurements: Height: 6' (182.9 cm) Weight: 167 lb 8.8 oz (76 kg) IBW/kg (Calculated) : 77.6  Vital Signs: Temp: 98.1 F (36.7 C) (12/27 1153) Temp Source: Oral (12/27 1153) BP: 110/71 (12/27 1000) Pulse Rate: 65 (12/27 1000)  Labs: Recent Labs    03/30/18 1702 03/30/18 1703 03/31/18 0328 03/31/18 0347  HGB 10.6*  --  9.5*  --   HCT 32.0*  --  29.9*  --   PLT 491*  --  364  --   APTT  --   --   --  40*  LABPROT  --   --   --  16.3*  INR  --   --   --  1.33  HEPARINUNFRC  --   --   --  1.76*  CREATININE 0.94  --  0.78  --   TROPONINI  --  <0.03 <0.03  --     Estimated Creatinine Clearance: 76.5 mL/min (by C-G formula based on SCr of 0.78 mg/dL).   Medical History: Past Medical History:  Diagnosis Date  . Arthritis    ostearthritis- back, knee  . BPH with obstruction/lower urinary tract symptoms    urologist-  dr r. evans @[facility]   . Bradycardia   . Coronary artery disease    cardiologist-- dr Jacinto Halimganji  . History of glaucoma    per pt no issues since cataract extraction's bilaterally 2015  . History of squamous cell carcinoma excision    face x2  . PAF (paroxysmal atrial fibrillation) [facility]    cardiologist-- dr Jacinto Halimganji  . S/P drug eluting coronary stent placement 03/04/2015   DES x1 to mid CFx and DES x1 to mid LAD  . Wears glasses   . Wears hearing aid in both ears     Medications:  PTA Eliquis 5mg - prescribed as BID but patient only takes once daily  Assessment: 84 yo M on Eliquis PTA for afib.  He does not take this as prescribed by his doctor.  Appropriate dose for patient's age, Scr, weight is 5mg  po BID.   CBC reviewed- no bleeding noted.  Hg low, Pltc WNL. IV heparin was stopped ~0800.  MRI brain was negative.   Plan:  Resume Eliquis 5mg  PO BID.  Monitor CBC and s/sx of bleeding  Stuart Flores, Stuart RidingAndrea  Flores 03/31/2018,12:16 PM

## 2018-03-31 NOTE — ED Notes (Signed)
Call to floor unable to take pt at this time will call when bed is ready

## 2018-03-31 NOTE — ED Notes (Signed)
. ED TO INPATIENT HANDOFF REPORT  Name/Age/Gender Stuart Flores 84 y.o. male  Code Status Code Status History    Date Active Date Inactive Code Status Order ID Comments User Context   03/09/2018 1233 03/12/2018 1542 Full Code [id number]  Shelly Coss Inpatient   11/24/2017 1035 11/25/2017 1736 Full Code [id number]  Shelly Coss Inpatient   10/27/2015 1337 10/29/2015 1651 Full Code [id number]  Ollen Gross, MD Inpatient   03/04/2015 1149 03/05/2015 1423 Full Code [id number]  Yates Decamp, MD Inpatient      Home/SNF/Other Home  Chief Complaint dr referral   Level of Care/Admitting Diagnosis ED Disposition    ED Disposition Condition Comment   Admit  Hospital Area: [facility] [[id number]]  Level of Care: Stepdown [14]  Admit to SDU based on following criteria: Severe physiological/psychological symptoms:  Any diagnosis requiring assessment & intervention at least every 4 hours on an ongoing basis to obtain desired patient outcomes including stability and rehabilitation  Diagnosis: Acute encephalopathy [244010]  Admitting Physician: Eduard Clos [phone]  Attending Physician: Eduard Clos [phone]  PT Class (Do Not Modify): Observation [104]  PT Acc Code (Do Not Modify): Observation [10022]       Medical History Past Medical History:  Diagnosis Date  . Arthritis    ostearthritis- back, knee  . BPH with obstruction/lower urinary tract symptoms    urologist-  dr r. evans @[facility]   . Bradycardia   . Coronary artery disease    cardiologist-- dr Jacinto Halim  . History of glaucoma    per pt no issues since cataract extraction's bilaterally 2015  . History of squamous cell carcinoma excision    face x2  . PAF (paroxysmal atrial fibrillation) [facility])    cardiologist-- dr Jacinto Halim  . S/P drug eluting coronary stent placement 03/04/2015   DES x1 to mid CFx and DES x1 to mid LAD  . Wears glasses   . Wears hearing aid in both ears     Allergies No  Known Allergies  IV Location/Drains/Wounds Patient Lines/Drains/Airways Status   Active Line/Drains/Airways    Name:   Placement date:   Placement time:   Site:   Days:   Peripheral IV 03/30/18 Left Antecubital   03/30/18    1711    Antecubital   1   Incision (Closed) 03/09/18 Knee Left   03/09/18    1045     22          Labs/Imaging Results for orders placed or performed during the hospital encounter of 03/30/18 (from the past 48 hour(s))  Comprehensive metabolic panel     Status: Abnormal   Collection Time: 03/30/18  5:02 PM  Result Value Ref Range   Sodium 132 (L) 135 - 145 mmol/L   Potassium 4.4 3.5 - 5.1 mmol/L   Chloride 102 98 - 111 mmol/L   CO2 19 (L) 22 - 32 mmol/L   Glucose, Bld 95 70 - 99 mg/dL   BUN 18 8 - 23 mg/dL   Creatinine, Ser 4.03 0.61 - 1.24 mg/dL   Calcium 9.2 8.9 - 47.4 mg/dL   Total Protein 5.9 (L) 6.5 - 8.1 g/dL   Albumin 3.9 3.5 - 5.0 g/dL   AST 27 15 - 41 U/L   ALT 19 0 - 44 U/L   Alkaline Phosphatase 61 38 - 126 U/L   Total Bilirubin 0.9 0.3 - 1.2 mg/dL   GFR calc non Af Amer >60 >60 mL/min   GFR calc  Af Amer >60 >60 mL/min   Anion gap 11 5 - 15    Comment: Performed at [facility], [address] [place], [place] [address]  CBC     Status: Abnormal   Collection Time: 03/30/18  5:02 PM  Result Value Ref Range   WBC 8.3 4.0 - 10.5 K/uL   RBC 3.59 (L) 4.22 - 5.81 MIL/uL   Hemoglobin 10.6 (L) 13.0 - 17.0 g/dL   HCT 60.432.0 (L) 54.039.0 - 98.152.0 %   MCV 89.1 80.0 - 100.0 fL   MCH 29.5 26.0 - 34.0 pg   MCHC 33.1 30.0 - 36.0 g/dL   RDW 19.113.8 47.811.5 - 29.515.5 %   Platelets 491 (H) 150 - 400 K/uL   nRBC 0.0 0.0 - 0.2 %    Comment: Performed at [facility], [address] [place], [place] [address]  Urinalysis, Routine w reflex microscopic     Status: Abnormal   Collection Time: 03/30/18  5:02 PM  Result Value Ref Range   Color, Urine YELLOW YELLOW   APPearance CLOUDY (A) CLEAR   Specific Gravity, Urine 1.019 1.005 -  1.030   pH 8.0 5.0 - 8.0   Glucose, UA NEGATIVE NEGATIVE mg/dL   Hgb urine dipstick NEGATIVE NEGATIVE   Bilirubin Urine NEGATIVE NEGATIVE   Ketones, ur 5 (A) NEGATIVE mg/dL   Protein, ur NEGATIVE NEGATIVE mg/dL   Nitrite NEGATIVE NEGATIVE   Leukocytes, UA NEGATIVE NEGATIVE    Comment: Performed at [facility], [address] [place], [place] [address]  Troponin I - Once     Status: None   Collection Time: 03/30/18  5:03 PM  Result Value Ref Range   Troponin I <0.03 <0.03 ng/mL    Comment: Performed at [facility], [address] [place], [place] [address]  I-Stat CG4 Lactic Acid, ED     Status: None   Collection Time: 03/30/18  5:12 PM  Result Value Ref Range   Lactic Acid, Venous 1.43 0.5 - 1.9 mmol/L   Dg Chest 2 View  Result Date: 03/30/2018 CLINICAL DATA:  Confusion EXAM: CHEST - 2 VIEW COMPARISON:  05/16/2003 FINDINGS: Heart is normal size. Tortuous aorta. Lungs clear. No effusions or pneumothorax. No acute bony abnormality. IMPRESSION: No active cardiopulmonary disease. Electronically Signed   By: Charlett NoseKevin  Dover M.D.   On: 03/30/2018 18:08   Ct Head Wo Contrast  Result Date: 03/30/2018 CLINICAL DATA:  Altered level of consciousness. EXAM: CT HEAD WITHOUT CONTRAST TECHNIQUE: Contiguous axial images were obtained from the base of the skull through the vertex without intravenous contrast. COMPARISON:  None. FINDINGS: Brain: There is atrophy and chronic small vessel disease changes. No acute intracranial abnormality. Specifically, no hemorrhage, hydrocephalus, mass lesion, acute infarction, or significant intracranial injury. Vascular: No hyperdense vessel or unexpected calcification. Skull: No acute calvarial abnormality. Sinuses/Orbits: Visualized paranasal sinuses and mastoids clear. Orbital soft tissues unremarkable. Other: None IMPRESSION: Atrophy, chronic microvascular disease. No acute intracranial abnormality. Electronically Signed   By:  Charlett NoseKevin  Dover M.D.   On: 03/30/2018 18:00   Vas Koreas Lower Extremity Venous (dvt) (only Mc & Wl)  Result Date: 03/30/2018  Lower Venous Study Indications: Swelling, and Edema.  Limitations: Pain tolerance. Performing Technologist: Blanch MediaMegan Riddle RVS  Examination Guidelines: A complete evaluation includes B-mode imaging, spectral Doppler, color Doppler, and power Doppler as needed of all accessible portions of each vessel. Bilateral testing is considered an integral part of a complete examination. Limited examinations  for reoccurring indications may be performed as noted.  Left Venous Findings: +---------+---------------+---------+-----------+----------+-------------------+          CompressibilityPhasicitySpontaneityPropertiesSummary             +---------+---------------+---------+-----------+----------+-------------------+ CFV      Full           Yes      Yes                                      +---------+---------------+---------+-----------+----------+-------------------+ SFJ      Full                                                             +---------+---------------+---------+-----------+----------+-------------------+ FV Prox  Full                                                             +---------+---------------+---------+-----------+----------+-------------------+ FV Mid   Full                                                             +---------+---------------+---------+-----------+----------+-------------------+ FV Distal               Yes      Yes                  unable to compress                                                        due to pain                                                               tolerance           +---------+---------------+---------+-----------+----------+-------------------+ PFV      Full                                                              +---------+---------------+---------+-----------+----------+-------------------+ POP      Full           Yes      Yes                                      +---------+---------------+---------+-----------+----------+-------------------+  PTV      Full                                                             +---------+---------------+---------+-----------+----------+-------------------+ PERO                                                  Not visualized      +---------+---------------+---------+-----------+----------+-------------------+    Summary: Left: There is no evidence of deep vein thrombosis in the lower extremity. No cystic structure found in the popliteal fossa.  *See table(s) above for measurements and observations.    Preliminary    None  Pending Labs Unresulted Labs (From admission, onward)    Start     Ordered   03/30/18 1657  Culture, blood (routine x 2)  BLOOD CULTURE X 2,   STAT     03/30/18 1656   03/30/18 1650  Urine culture  ONCE - STAT,   STAT     03/30/18 1649          Vitals/Pain Today's Vitals   03/30/18 2145 03/30/18 2146 03/30/18 2200 03/30/18 2305  BP: (!) 113/98 110/61 (!) 108/54 120/68  Pulse:  92 95 92  Resp: 12 16 19 12   Temp:      TempSrc:      SpO2:  95% 92% 96%  PainSc:        Isolation Precautions No active isolations  Medications Medications  ondansetron (ZOFRAN) injection 4 mg (0 mg Intravenous Hold 03/30/18 1832)  morphine 4 MG/ML injection 4 mg (4 mg Intravenous Refused 03/30/18 1814)  sodium chloride 0.9 % bolus 1,000 mL (0 mLs Intravenous Stopped 03/30/18 1832)  oxyCODONE-acetaminophen (PERCOCET/ROXICET) 5-325 MG per tablet 1 tablet (1 tablet Oral Given 03/30/18 1831)  LORazepam (ATIVAN) injection 1 mg (1 mg Intravenous Given 03/30/18 2152)  LORazepam (ATIVAN) injection 1 mg (1 mg Intravenous Given 03/30/18 2307)    Mobility walks

## 2018-03-31 NOTE — H&P (Signed)
History and Physical    Stuart Flores NFA:[id number] DOB: [birth year] DOA: 03/30/2018  PCP: Chilton Greathouse, MD  Patient coming from: Home.  Chief Complaint: Confusion.  History obtained from patient's daughter and patient's wife.  Patient is confused.  HPI: Stuart Flores is a 84 y.o. male with history of CAD status post stenting and atrial fibrillation on apixaban was brought to the ER after patient was found to be increasingly confused since yesterday morning.  Patient has had a recent left knee replacement on December 5 by Dr. Charlann Boxer orthopedic surgeon.  As per the family patient has been doing fine until 24 hours ago when patient started getting more confused mostly after yesterday's breakfast.  Patient was taken to the rehab for his left knee surgery over there patient was not following commands and appeared completely confused and was advised to take him to his orthopedic surgeon and orthopedic surgeon advised to bring him to the ER.  Prior to coming patient had complained of some headache has not had any nausea vomiting fever chills chest pain shortness of breath or any productive cough.  Nobody sick at home and has not had any recent sick visits or travel.  Patient has been taking hydrocodone for his left knee pain and patient's wife is in position of the medication.  Last dose was yesterday morning prior to the breakfast.  No seizure-like activities or any incontinence of urine or bowel.  ED Course: In the ER CT head was unremarkable patient was afebrile no leukocytosis patient had required some lorazepam for agitation.  Also was given some morphine for pain of the left knee.  Left knee was mildly swollen mildly warm to touch but no definite signs of any erythema.  Doppler of the left lower extremity was done which was negative for any DVT given the swelling.  Chest x-ray unremarkable UA did not show anything acute.  Urine drug screen is pending.  On my exam patient is following  commands but encephalopathy at times speaking Spanish which is new for the patient as per the family.  Review of Systems: As per HPI, rest all negative.   Past Medical History:  Diagnosis Date  . Arthritis    ostearthritis- back, knee  . BPH with obstruction/lower urinary tract symptoms    urologist-  dr r. evans @[facility]   . Bradycardia   . Coronary artery disease    cardiologist-- dr Jacinto Halim  . History of glaucoma    per pt no issues since cataract extraction's bilaterally 2015  . History of squamous cell carcinoma excision    face x2  . PAF (paroxysmal atrial fibrillation) [facility])    cardiologist-- dr Jacinto Halim  . S/P drug eluting coronary stent placement 03/04/2015   DES x1 to mid CFx and DES x1 to mid LAD  . Wears glasses   . Wears hearing aid in both ears     Past Surgical History:  Procedure Laterality Date  . CARDIAC CATHETERIZATION N/A 03/04/2015   Procedure: Left Heart Cath and Coronary Angiography;  Surgeon: Yates Decamp, MD;  Location: [facility] INVASIVE CV LAB;  Service: Cardiovascular;  Laterality: N/A;  . CARDIAC CATHETERIZATION  03/04/2015   Procedure: Coronary Stent Intervention;  Surgeon: Yates Decamp, MD;  Location: [facility] INVASIVE CV LAB;  Service: Cardiovascular;;  DES to mCFx and mLAD  . CARDIOVASCULAR STRESS TEST  12-27-2016   dr Jacinto Halim   Low risk nuclear study w/ no ischemia/  normal LV function and wall function, ef 67%  .  CATARACT EXTRACTION W/ INTRAOCULAR LENS  IMPLANT, BILATERAL Bilateral 2015  . HAND SURGERY Left early 2000s   "thumb surgery for Tendon pair"  . KNEE ARTHROSCOPY Bilateral   . STERIOD INJECTION Left 11/24/2017   Procedure: LEFT KNEE CORTISONE INJECTION;  Surgeon: Durene Romans, MD;  Location: [facility];  Service: Orthopedics;  Laterality: Left;  . TONSILLECTOMY  child  . TOTAL KNEE ARTHROPLASTY Right 10/27/2015   Procedure: TOTAL RIGHT KNEE ARTHROPLASTY;  Surgeon: Ollen Gross, MD;  Location: [facility];  Service: Orthopedics;  Laterality: Right;  . TOTAL KNEE  ARTHROPLASTY Left 03/09/2018   Procedure: TOTAL KNEE ARTHROPLASTY;  Surgeon: Durene Romans, MD;  Location: [facility];  Service: Orthopedics;  Laterality: Left;  70 mins with block  . TOTAL KNEE REVISION WITH SCAR DEBRIDEMENT/PATELLA REVISION WITH POLY EXCHANGE Right 11/24/2017   Procedure: RIGHT KNEE OPEN ARTHROTOMY WITH SCAR DEBRIDEMENT AND POSSIBLE POLY EXCHANGE;  Surgeon: Durene Romans, MD;  Location: [facility];  Service: Orthopedics;  Laterality: Right;  90 mins  . TRANSTHORACIC ECHOCARDIOGRAM  01-20-2017   dr Jacinto Halim   mild concentric LVH, ef 55%/  trace AR/  mild MR, TR and  PR     reports that he has quit smoking. His smoking use included cigarettes. He has a 9.00 pack-year smoking history. He has never used smokeless tobacco. He reports current alcohol use of about 9.0 standard drinks of alcohol per week. He reports that he does not use drugs.  No Known Allergies  History reviewed. No pertinent family history.  Prior to Admission medications   Medication Sig Start Date End Date Taking? Authorizing Provider  alfuzosin (UROXATRAL) 10 MG 24 hr tablet Take 10 mg by mouth at bedtime.    Yes [provider]  apixaban (ELIQUIS) 5 MG TABS tablet Take 1 tablet (5 mg total) by mouth 2 (two) times daily. 12/09/16  Yes Newman Nip, NP  atorvastatin (LIPITOR) 40 MG tablet Take 40 mg by mouth daily. In the morning 03/03/15  Yes [provider]  HYDROcodone-acetaminophen (NORCO) 7.5-325 MG tablet Take 1-2 tablets by mouth every 4 (four) hours as needed for moderate pain. Patient taking differently: Take 0.5-1 tablets by mouth every 4 (four) hours as needed for moderate pain.  03/10/18  Yes Babish, Molli Hazard, PA-C  methocarbamol (ROBAXIN) 500 MG tablet Take 1 tablet (500 mg total) by mouth every 6 (six) hours as needed for muscle spasms. 03/10/18  Yes Babish, Molli Hazard, PA-C  metoprolol succinate (TOPROL-XL) 50 MG 24 hr tablet Take 25 mg by mouth daily with breakfast.  02/25/15  Yes [provider]  Multiple Vitamin (MULTIVITAMIN WITH MINERALS) TABS tablet Take 1 tablet by mouth daily.   Yes [provider]  nitroGLYCERIN (NITROSTAT) 0.4 MG SL tablet Place 0.4 mg under the tongue every 5 (five) minutes as needed for chest pain (No more than 3 doses).  02/25/15  Yes [provider]  polyethylene glycol (MIRALAX / GLYCOLAX) packet Take 17 g by mouth 2 (two) times daily. Patient taking differently: Take 17 g by mouth daily.  03/10/18  Yes Babish, Molli Hazard, PA-C  Probiotic Product (PROBIOTIC DAILY PO) Take 1 capsule by mouth daily.   Yes [provider]  docusate sodium (COLACE) 100 MG capsule Take 1 capsule (100 mg total) by mouth 2 (two) times daily. Patient not taking: Reported on 03/30/2018 03/10/18   Lanney Gins, PA-C  ferrous sulfate (FERROUSUL) 325 (65 FE) MG tablet Take 1 tablet (325 mg total) by mouth 3 (three) times daily with  meals. Patient not taking: Reported on 03/30/2018 03/10/18   Lanney Gins, PA-C    Physical Exam: Vitals:   03/30/18 2146 03/30/18 2200 03/30/18 2305 03/31/18 0258  BP: 110/61 (!) 108/54 120/68 (!) 152/79  Pulse: 92 95 92 96  Resp: 16 19 12    Temp:    98.2 F (36.8 C)  TempSrc:    Oral  SpO2: 95% 92% 96% 100%  Height:    6' (1.829 m)      Constitutional: Moderately built and nourished. Vitals:   03/30/18 2146 03/30/18 2200 03/30/18 2305 03/31/18 0258  BP: 110/61 (!) 108/54 120/68 (!) 152/79  Pulse: 92 95 92 96  Resp: 16 19 12    Temp:    98.2 F (36.8 C)  TempSrc:    Oral  SpO2: 95% 92% 96% 100%  Height:    6' (1.829 m)   Eyes: Anicteric no pallor. ENMT: No discharge from the ears eyes nose or mouth. Neck: No mass felt.  No neck rigidity. Respiratory: No rhonchi or crepitations. Cardiovascular: S1-S2 heard. Abdomen: Soft nontender bowel sounds present. Musculoskeletal: No edema.  No joint effusion. Skin: No rash. Neurologic: Lethargic and confused follows commands but not oriented to place  person not time.  Pupils are equal and reacting to light. Psychiatric: Appears confused.   Labs on Admission: I have personally reviewed following labs and imaging studies  CBC: Recent Labs  Lab 03/30/18 1702  WBC 8.3  HGB 10.6*  HCT 32.0*  MCV 89.1  PLT 491*   Basic Metabolic Panel: Recent Labs  Lab 03/30/18 1702  NA 132*  K 4.4  CL 102  CO2 19*  GLUCOSE 95  BUN 18  CREATININE 0.94  CALCIUM 9.2   GFR: CrCl cannot be calculated (Unknown ideal weight.). Liver Function Tests: Recent Labs  Lab 03/30/18 1702  AST 27  ALT 19  ALKPHOS 61  BILITOT 0.9  PROT 5.9*  ALBUMIN 3.9   No results for input(s): LIPASE, AMYLASE in the last 168 hours. No results for input(s): AMMONIA in the last 168 hours. Coagulation Profile: No results for input(s): INR, PROTIME in the last 168 hours. Cardiac Enzymes: Recent Labs  Lab 03/30/18 1703  TROPONINI <0.03   BNP (last 3 results) No results for input(s): PROBNP in the last 8760 hours. HbA1C: No results for input(s): HGBA1C in the last 72 hours. CBG: No results for input(s): GLUCAP in the last 168 hours. Lipid Profile: No results for input(s): CHOL, HDL, LDLCALC, TRIG, CHOLHDL, LDLDIRECT in the last 72 hours. Thyroid Function Tests: No results for input(s): TSH, T4TOTAL, FREET4, T3FREE, THYROIDAB in the last 72 hours. Anemia Panel: No results for input(s): VITAMINB12, FOLATE, FERRITIN, TIBC, IRON, RETICCTPCT in the last 72 hours. Urine analysis:    Component Value Date/Time   COLORURINE YELLOW 03/30/2018 1702   APPEARANCEUR CLOUDY (A) 03/30/2018 1702   LABSPEC 1.019 03/30/2018 1702   PHURINE 8.0 03/30/2018 1702   GLUCOSEU NEGATIVE 03/30/2018 1702   HGBUR NEGATIVE 03/30/2018 1702   BILIRUBINUR NEGATIVE 03/30/2018 1702   KETONESUR 5 (A) 03/30/2018 1702   PROTEINUR NEGATIVE 03/30/2018 1702   NITRITE NEGATIVE 03/30/2018 1702   LEUKOCYTESUR NEGATIVE 03/30/2018 1702   Sepsis  Labs: @LABRCNTIP (procalcitonin:4,lacticidven:4) )No results found for this or any previous visit (from the past 240 hour(s)).   Radiological Exams on Admission: Dg Chest 2 View  Result Date: 03/30/2018 CLINICAL DATA:  Confusion EXAM: CHEST - 2 VIEW COMPARISON:  05/16/2003 FINDINGS: Heart is normal size. Tortuous aorta. Lungs clear. No effusions  or pneumothorax. No acute bony abnormality. IMPRESSION: No active cardiopulmonary disease. Electronically Signed   By: Charlett NoseKevin  Dover M.D.   On: 03/30/2018 18:08   Ct Head Wo Contrast  Result Date: 03/30/2018 CLINICAL DATA:  Altered level of consciousness. EXAM: CT HEAD WITHOUT CONTRAST TECHNIQUE: Contiguous axial images were obtained from the base of the skull through the vertex without intravenous contrast. COMPARISON:  None. FINDINGS: Brain: There is atrophy and chronic small vessel disease changes. No acute intracranial abnormality. Specifically, no hemorrhage, hydrocephalus, mass lesion, acute infarction, or significant intracranial injury. Vascular: No hyperdense vessel or unexpected calcification. Skull: No acute calvarial abnormality. Sinuses/Orbits: Visualized paranasal sinuses and mastoids clear. Orbital soft tissues unremarkable. Other: None IMPRESSION: Atrophy, chronic microvascular disease. No acute intracranial abnormality. Electronically Signed   By: Charlett NoseKevin  Dover M.D.   On: 03/30/2018 18:00   Vas [place]as Lower Extremity Venous (dvt) (only Mc & W[facility])  Result Date: 03/30/2018  Lower Venous Study Indications: Swelling, and Edema.  Limitations: Pain tolerance. Performing Technologist: Blanch MediaMegan Riddle RVS  Examination Guidelines: A complete evaluation includes B-mode imaging, spectral Doppler, color Doppler, and power Doppler as needed of all accessible portions of each vessel. Bilateral testing is considered an integral part of a complete examination. Limited examinations for reoccurring indications may be performed as noted.  Left Venous Findings:  +---------+---------------+---------+-----------+----------+-------------------+          CompressibilityPhasicitySpontaneityPropertiesSummary             +---------+---------------+---------+-----------+----------+-------------------+ CFV      Full           Yes      Yes                                      +---------+---------------+---------+-----------+----------+-------------------+ SFJ      Full                                                             +---------+---------------+---------+-----------+----------+-------------------+ FV Prox  Full                                                             +---------+---------------+---------+-----------+----------+-------------------+ FV Mid   Full                                                             +---------+---------------+---------+-----------+----------+-------------------+ FV Distal               Yes      Yes                  unable to compress  due to pain                                                               tolerance           +---------+---------------+---------+-----------+----------+-------------------+ PFV      Full                                                             +---------+---------------+---------+-----------+----------+-------------------+ POP      Full           Yes      Yes                                      +---------+---------------+---------+-----------+----------+-------------------+ PTV      Full                                                             +---------+---------------+---------+-----------+----------+-------------------+ PERO                                                  Not visualized      +---------+---------------+---------+-----------+----------+-------------------+    Summary: Left: There is no evidence of deep vein thrombosis in the lower extremity.  No cystic structure found in the popliteal fossa.  *See table(s) above for measurements and observations.    Preliminary     EKG: Independently reviewed.  Normal sinus rhythm.  Assessment/Plan Principal Problem:   Acute encephalopathy Active Problems:   Hyperlipidemia   S/P left TKA   CAD (coronary artery disease)    1. Acute encephalopathy -cause not clear.  Patient is afebrile with no definite signs of any meningitis.  I have ordered blood cultures sed rate.  Will keep patient empirically on acyclovir for now.  Given history of atrial fibrillation and acute onset of patient's changes will get MRI of the brain to rule out any stroke.  Will hold pain medication keep patient on PRN lorazepam for any agitation.  If symptoms does not improve patient will need lumbar puncture note that patient had taken his apixaban yesterday morning and will need at least to be off it for 24 hours prior to doing lumbar puncture.  Ammonia levels were negative and UA is pending. 2. Atrial fibrillation on apixaban and metoprolol.  I have dosed metoprolol IV for now.  Holding apixaban keeping patient on heparin in anticipation of possible procedure. 3. CAD has not complained of any chest pain. 4. Recent left knee surgery with mild swelling of the left knee.  Dopplers were negative for DVT. 5. Chronic anemia follow CBC.   DVT prophylaxis: Heparin infusion. Code Status: Full code. Family Communication: Patient's wife and daughter.  Disposition Plan: Home. Consults called: None. Admission status: Observation.   Eduard Clos MD [facility] Pager [phone].  If 7PM-7AM, please contact night-coverage [email or web address] Password [facility]  03/31/2018, 3:17 AM

## 2018-04-01 DIAGNOSIS — I480 Paroxysmal atrial fibrillation: Secondary | ICD-10-CM

## 2018-04-01 DIAGNOSIS — E871 Hypo-osmolality and hyponatremia: Secondary | ICD-10-CM

## 2018-04-01 DIAGNOSIS — G9340 Encephalopathy, unspecified: Secondary | ICD-10-CM | POA: Diagnosis not present

## 2018-04-01 LAB — URINE CULTURE: Culture: <10000 — AB

## 2018-04-01 LAB — GLUCOSE, CAPILLARY
GLUCOSE-CAPILLARY: 118 mg/dL — AB (ref 70–99)
GLUCOSE-CAPILLARY: 93 mg/dL (ref 70–99)
Glucose-Capillary: 89 mg/dL (ref 70–99)
Glucose-Capillary: 80 mg/dL (ref 70–99)

## 2018-04-01 MED ORDER — DESMOPRESSIN ACETATE 0.1 MG PO TABS: 0.0500 mg | ORAL_TABLET | Freq: Every day | ORAL | Status: DC

## 2018-04-01 MED ORDER — METOPROLOL SUCCINATE ER 25 MG PO TB24
12.5000 mg | ORAL_TABLET | Freq: Every day | ORAL | Status: DC
  Administered 2018-04-01 – 2018-04-02 (×2): 12.5 mg via ORAL
  Filled 2018-04-01 (×2): qty 1

## 2018-04-01 MED ORDER — FERROUS SULFATE 325 (65 FE) MG PO TABS
325.0000 mg | ORAL_TABLET | Freq: Every day | ORAL | Status: DC
  Filled 2018-04-01: qty 1

## 2018-04-01 NOTE — Progress Notes (Signed)
PROGRESS NOTE  Stuart HazyKenneth W Flores ZOX:[id number] DOB: 11-08-1933 DOA: 03/30/2018 PCP: Chilton GreathouseAvva, Ravisankar, MD  HPI/Recap of past 24 hours:  Much improved, but still not at baseline  Condom catheter with clear urine, denies pain, no fever  Family at bedside  Assessment/Plan: Principal Problem:   Acute encephalopathy Active Problems:   Hyperlipidemia   S/P left TKA   CAD (coronary artery disease)  Acute metabolic encephalopathy: -acutely confused at outpatient PT  -MRI brain no acute findings -no infection identified, blood culture no growth, urine culture insignificant growth,cxr no acute findings -tele unremarkable -avoid narcotics, ( he was on prn narcotics since after his left knee surgery), correct sodium -improving    Hyponatremia Hold off DDAVP for now Continue gentle hydration, encourage oral intake  S/p left knee surgery PT eval  PAF,  Tele sinus rhythm resume home meds toprol-xl, apixaban  Recent left knee surgery with mild swelling of the left knee.  Dopplers were negative for DVT.  Chronic anemia: normocytic  No overt sign of bleeding Monitor, continue oral iron supplement daily  Code Status: full  Family Communication: patient and family  Disposition Plan: home , hopefully in am if sodium and confusion improves   Consultants:  none  Procedures:  none  Antibiotics:  none   Objective: BP 129/75 (BP Location: Left Arm)   Pulse 70   Temp 97.7 F (36.5 C) (Oral)   Resp 18   Ht 6' (1.829 m)   Wt 73.4 kg   SpO2 100%   BMI 21.95 kg/m   Intake/Output Summary (Last 24 hours) at 04/01/2018 1125 Last data filed at 04/01/2018 0601 Gross per 24 hour  Intake 302.38 ml  Output 900 ml  Net -597.62 ml   Filed Weights   03/31/18 0426 03/31/18 1457  Weight: 76 kg 73.4 kg    Exam: Patient is examined daily including today on 04/01/2018, exams remain the same as of yesterday except that has changed    General:  NAD, aaox3, slow in  answering questions  Cardiovascular: RRR  Respiratory: CTABL  Abdomen: Soft/ND/NT, positive BS  Musculoskeletal: No Edema, left knee post op changes, surgical wound healing well  Neuro: alert, oriented   Data Reviewed: Basic Metabolic Panel: Recent Labs  Lab 03/30/18 1702 03/31/18 0328  NA 132* 131*  K 4.4 3.8  CL 102 104  CO2 19* 19*  GLUCOSE 95 90  BUN 18 15  CREATININE 0.94 0.78  CALCIUM 9.2 8.1*   Liver Function Tests: Recent Labs  Lab 03/30/18 1702 03/31/18 0328  AST 27 29  ALT 19 16  ALKPHOS 61 52  BILITOT 0.9 0.8  PROT 5.9* 5.4*  ALBUMIN 3.9 3.4*   No results for input(s): LIPASE, AMYLASE in the last 168 hours. Recent Labs  Lab 03/31/18 0328  AMMONIA 21   CBC: Recent Labs  Lab 03/30/18 1702 03/31/18 0328  WBC 8.3 7.3  NEUTROABS  --  4.2  HGB 10.6* 9.5*  HCT 32.0* 29.9*  MCV 89.1 93.7  PLT 491* 364   Cardiac Enzymes:   Recent Labs  Lab 03/30/18 1703 03/31/18 0328  TROPONINI <0.03 <0.03   BNP (last 3 results) No results for input(s): BNP in the last 8760 hours.  ProBNP (last 3 results) No results for input(s): PROBNP in the last 8760 hours.  CBG: Recent Labs  Lab 03/31/18 0504 03/31/18 1148 03/31/18 2336 04/01/18 0556  GLUCAP 84 87 105* 89    Recent Results (from the past 240 hour(s))  Urine culture  Status: Abnormal   Collection Time: 03/30/18  5:03 PM  Result Value Ref Range Status   Specimen Description   Final    URINE, CLEAN CATCH Performed at [facility], [address], [place], [place] [address]    Special Requests   Final    NONE Performed at [facility], [address], [place], [place] [address]    Culture (A)  Final    <10,000 COLONIES/mL INSIGNIFICANT GROWTH Performed at [facility], [address], [place], [place] [address]    Report Status 04/01/2018 FINAL  Final  Culture, blood (routine x 2)     Status: None (Preliminary result)   Collection Time:  03/30/18  5:03 PM  Result Value Ref Range Status   Specimen Description   Final    BLOOD LEFT ANTECUBITAL Performed at [facility], [address], [place], [place] [address]    Special Requests   Final    BOTTLES DRAWN AEROBIC AND ANAEROBIC Blood Culture adequate volume Performed at [facility], [address], [place], [place] [address]    Culture   Final    NO GROWTH 2 DAYS Performed at [facility], [address], [place], [place] [address]    Report Status PENDING  Incomplete  Culture, blood (routine x 2)     Status: None (Preliminary result)   Collection Time: 03/30/18  5:22 PM  Result Value Ref Range Status   Specimen Description   Final    BLOOD LEFT HAND Performed at [facility], [address] [place], [place] [address]    Special Requests   Final    BOTTLES DRAWN AEROBIC ONLY Blood Culture adequate volume Performed at [facility], [address] [place], [place] [address]    Culture   Final    NO GROWTH 2 DAYS Performed at [facility], [address] [place], [place] [address]    Report Status PENDING  Incomplete  MRSA PCR Screening     Status: None   Collection Time: 03/31/18  2:44 AM  Result Value Ref Range Status   MRSA by PCR NEGATIVE NEGATIVE Final    Comment:        The GeneXpert MRSA Assay (FDA approved for NASAL specimens only), is one component of a comprehensive MRSA colonization surveillance program. It is not intended to diagnose MRSA infection nor to guide or monitor treatment for MRSA infections. Performed at [facility], [address], [place], [place] [address]      Studies: Mr Brain Wo Contrast  Result Date: 03/31/2018 CLINICAL DATA:  Progressive confusion since yesterday morning. Altered level of consciousness. EXAM: MRI HEAD WITHOUT CONTRAST TECHNIQUE: Multiplanar, multiecho pulse sequences of the brain and surrounding  structures were obtained without intravenous contrast. COMPARISON:  CT head without contrast 03/30/2018 FINDINGS: Brain: Diffusion-weighted images demonstrate no acute or subacute infarction. Acute hemorrhage or mass lesion is present. Ventricles are proportionate to the degree of atrophy. Moderate periventricular and subcortical white matter changes are present bilaterally. Dilated perivascular spaces are present within the basal ganglia. White matter changes extend into the brainstem. The cerebellum is unremarkable. The internal auditory canals are within normal limits. Vascular: Flow is present in the major intracranial arteries. Bilateral lens replacements are present. Globes and orbits are otherwise unremarkable Skull and upper cervical spine: Craniocervical junction is normal. Upper cervical spine is within normal limits. A relatively empty sella is noted.  Sinuses/Orbits: Mild mucosal thickening bilaterally is present in the ethmoid air cells paranasal sinuses and mastoid air cells are otherwise clear. IMPRESSION: 1. No acute or focal abnormality to explain acute confusion or altered level of consciousness. 2. Atrophy and white matter disease are moderately advanced, even for age. This likely reflects the sequela of chronic microvascular ischemia. 3. Minimal sinus disease. Electronically Signed   By: Marin Robertshristopher  Mattern M.D.   On: 03/31/2018 11:56    Scheduled Meds: . alfuzosin  10 mg Oral QHS  . apixaban  5 mg Oral BID  . atorvastatin  40 mg Oral Daily  . [START ON 04/04/2018] desmopressin  0.05 mg Oral QHS  . docusate sodium  100 mg Oral BID  . [START ON 04/02/2018] ferrous sulfate  325 mg Oral Q breakfast  . metoprolol succinate  12.5 mg Oral Daily    Continuous Infusions: . sodium chloride 50 mL/hr at 03/31/18 1742     Time spent: 35mins I have personally reviewed and interpreted on  04/01/2018 daily labs, tele strips, imagings as discussed above under date review session and  assessment and plans.  I reviewed all nursing notes, pharmacy notes, consultant notes,  vitals, pertinent old records  I have discussed plan of care as described above with RN , patient and family on 04/01/2018   Albertine GratesFang Amanie Mcculley MD, PhD  [facility] Pager [phone]. If 7PM-7AM, please contact night-coverage at [email or web address], password [email or web address] 04/01/2018, 11:25 AM  LOS: 0 days

## 2018-04-01 NOTE — Evaluation (Signed)
Physical Therapy Evaluation Patient Details Name: Stuart Flores MRN: [id number] DOB: 16-Jul-1933 Today's Date: 04/01/2018   History of Present Illness  84 yo male adm with confusion, encephalopathy fo unknown etiology, s/p L TKR on 03/09/18. PMH includes R TKR 2017 with revision 11/2017, OA, angina, CAD, HLD, bilateral knee arthroscopies, coronary angioplasty with stent placement, cataracts.  Clinical Impression  Pt admitted with above diagnosis. Pt currently with functional limitations due to the deficits listed below (see PT Problem List).  Pt confusion much improved, amb ~ 240' with RW and min/guard for safety; reviewed knee precautions and locked out knee of bed to avoid pt being in constant knee flexion; recommned pt return to OPPT to continue work on knee ROM, strengthening and balance  Pt will benefit from skilled PT to increase their independence and safety with mobility to allow discharge to the venue listed below.       Follow Up Recommendations Outpatient PT(return to OPPT)    Equipment Recommendations  None recommended by PT    Recommendations for Other Services       Precautions / Restrictions Precautions Precautions: Fall;Knee Precaution Comments: reviewed no pillow under knee with pt and family Restrictions Weight Bearing Restrictions: No Other Position/Activity Restrictions: WBAT       Mobility  Bed Mobility Overal bed mobility: Needs Assistance Bed Mobility: Supine to Sit;Sit to Supine     Supine to sit: Supervision Sit to supine: Min guard   General bed mobility comments: min/guard for LLE, incr time fro supine to sit  Transfers Overall transfer level: Needs assistance Equipment used: Rolling walker (2 wheeled) Transfers: Sit to/from Stand Sit to Stand: Min guard         General transfer comment: cues for hand placement  Ambulation/Gait Ambulation/Gait assistance: Min guard Gait Distance (Feet): 240 Feet Assistive device: Rolling walker (2  wheeled) Gait Pattern/deviations: Decreased step length - right;Decreased step length - left;Step-to pattern;Step-through pattern;Shuffle;Antalgic;Trunk flexed Gait velocity: decr    General Gait Details: cues for trunk extension, step length,  and heel strike, tends to shuffle slightly/ decr df bil  Stairs            Wheelchair Mobility    Modified Rankin (Stroke Patients Only)       Balance Overall balance assessment: Needs assistance           Standing balance-Leahy Scale: Fair Standing balance comment: reliant on UEs for dynamic balance                             Pertinent Vitals/Pain Pain Assessment: No/denies pain    Home Living Family/patient expects to be discharged to:: Private residence Living Arrangements: Spouse/significant other Available Help at Discharge: Family;Available 24 hours/day Type of Home: House Home Access: Stairs to enter Entrance Stairs-Rails: Right Entrance Stairs-Number of Steps: 2  Home Layout: One level Home Equipment: Walker - 2 wheels;Cane - single point;Bedside commode      Prior Function Level of Independence: Independent         Comments: amb with RW since knee surgery     Hand Dominance        Extremity/Trunk Assessment   Upper Extremity Assessment Upper Extremity Assessment: Overall WFL for tasks assessed    Lower Extremity Assessment LLE Deficits / Details: ankle WLF; knee extension 2+/5, knee flexion 3/5;  AROM grossly  -6* to 95*       Communication   Communication: HOH  Cognition Arousal/Alertness: Awake/alert  Behavior During Therapy: [facility] for tasks assessed/performed                                   General Comments: admitted with confusion, slight delay in processing, Ox3      General Comments      Exercises Total Joint Exercises Ankle Circles/Pumps: AROM;Both;Seated;10 reps Quad Sets: AROM;Both;Supine;10 reps Long Arc Quad: AROM;Left;5 reps   Assessment/Plan     PT Assessment Patient needs continued PT services  PT Problem List Decreased strength;Decreased range of motion;Decreased activity tolerance;Decreased knowledge of use of DME;Decreased balance;Decreased safety awareness;Decreased mobility       PT Treatment Interventions DME instruction;Gait training;Functional mobility training;Therapeutic activities;Patient/family education;Therapeutic exercise    PT Goals (Current goals can be found in the Care Plan section)  Acute Rehab PT Goals Patient Stated Goal: none stated  PT Goal Formulation: With patient Time For Goal Achievement: 04/15/18 Potential to Achieve Goals: Good    Frequency Min 3X/week   Barriers to discharge        Co-evaluation               AM-PAC PT "6 Clicks" Mobility  Outcome Measure Help needed turning from your back to your side while in a flat bed without using bedrails?: None Help needed moving from lying on your back to sitting on the side of a flat bed without using bedrails?: A Little Help needed moving to and from a bed to a chair (including a wheelchair)?: A Little Help needed standing up from a chair using your arms (e.g., wheelchair or bedside chair)?: A Little Help needed to walk in hospital room?: A Little Help needed climbing 3-5 steps with a railing? : A Little 6 Click Score: 19    End of Session Equipment Utilized During Treatment: Gait belt Activity Tolerance: Patient tolerated treatment well Patient left: in bed;with call bell/phone within reach;with bed alarm set;with family/visitor present Nurse Communication: Mobility status PT Visit Diagnosis: Difficulty in walking, not elsewhere classified (R26.2)    Time: [id number] PT Time Calculation (min) (ACUTE ONLY): 26 min   Charges:   PT Evaluation $PT Eval Low Complexity: 1 Low PT Treatments $Gait Training: 8-22 mins        Drucilla Chaletara Cambridge Deleo, PT  Pager: [phone] Acute Rehab Dept [facility]):  [phone]   04/01/2018   [name],[name] 04/01/2018, 4:51 PM

## 2018-04-02 DIAGNOSIS — E871 Hypo-osmolality and hyponatremia: Secondary | ICD-10-CM | POA: Diagnosis not present

## 2018-04-02 DIAGNOSIS — G9340 Encephalopathy, unspecified: Secondary | ICD-10-CM | POA: Diagnosis not present

## 2018-04-02 DIAGNOSIS — I480 Paroxysmal atrial fibrillation: Secondary | ICD-10-CM | POA: Diagnosis not present

## 2018-04-02 DIAGNOSIS — Z96652 Presence of left artificial knee joint: Secondary | ICD-10-CM

## 2018-04-02 LAB — CBC WITH DIFFERENTIAL/PLATELET
Abs Immature Granulocytes: 0.04 10*3/uL (ref 0.00–0.07)
Basophils Absolute: 0.1 10*3/uL (ref 0.0–0.1)
Basophils Relative: 1 %
Eosinophils Absolute: 0.4 10*3/uL (ref 0.0–0.5)
Eosinophils Relative: 5 %
HCT: 30.2 % — ABNORMAL LOW (ref 39.0–52.0)
Hemoglobin: 9.9 g/dL — ABNORMAL LOW (ref 13.0–17.0)
Immature Granulocytes: 1 %
Lymphocytes Relative: 26 %
Lymphs Abs: 1.9 10*3/uL (ref 0.7–4.0)
MCH: 30.3 pg (ref 26.0–34.0)
MCHC: 32.8 g/dL (ref 30.0–36.0)
MCV: 92.4 fL (ref 80.0–100.0)
Monocytes Absolute: 0.9 10*3/uL (ref 0.1–1.0)
Monocytes Relative: 13 %
Neutro Abs: 4.1 10*3/uL (ref 1.7–7.7)
Neutrophils Relative %: 54 %
Platelets: 366 10*3/uL (ref 150–400)
RBC: 3.27 MIL/uL — ABNORMAL LOW (ref 4.22–5.81)
RDW: 13.5 % (ref 11.5–15.5)
WBC: 7.4 10*3/uL (ref 4.0–10.5)
nRBC: 0 % (ref 0.0–0.2)

## 2018-04-02 LAB — BASIC METABOLIC PANEL
Anion gap: 8 (ref 5–15)
BUN: 8 mg/dL (ref 8–23)
CO2: 20 mmol/L — AB (ref 22–32)
Calcium: 8.2 mg/dL — ABNORMAL LOW (ref 8.9–10.3)
Chloride: 102 mmol/L (ref 98–111)
Creatinine, Ser: 0.61 mg/dL (ref 0.61–1.24)
GFR calc Af Amer: >60 mL/min (ref >60)
GFR calc non Af Amer: >60 mL/min (ref >60)
Glucose, Bld: 88 mg/dL (ref 70–99)
Potassium: 3.6 mmol/L (ref 3.5–5.1)
Sodium: 130 mmol/L — ABNORMAL LOW (ref 135–145)

## 2018-04-02 LAB — TSH: TSH: 1.017 u[IU]/mL (ref 0.350–4.500)

## 2018-04-02 LAB — MAGNESIUM: Magnesium: 1.9 mg/dL (ref 1.7–2.4)

## 2018-04-02 LAB — GLUCOSE, CAPILLARY: Glucose-Capillary: 81 mg/dL (ref 70–99)

## 2018-04-02 MED ORDER — FERROUS SULFATE 325 (65 FE) MG PO TABS: 325.0000 mg | ORAL_TABLET | ORAL | 0 refills | Status: DC

## 2018-04-02 MED ORDER — ACETAMINOPHEN 325 MG PO TABS: 650.0000 mg | ORAL_TABLET | Freq: Four times a day (QID) | ORAL | 0 refills | Status: DC | PRN

## 2018-04-02 NOTE — Discharge Summary (Signed)
Discharge Summary  Stuart Flores ZOX:[id number] DOB: [birth year]  PCP: Chilton Greathouse, MD  Admit date: 03/30/2018 Discharge date: 04/02/2018  Time spent:  Recommendations for Outpatient Follow-up:  1. F/u with PCP within a week  for hospital discharge follow up, repeat cbc/bmp at follow up. pcp to follow up on sodium level. 2. F/u with neurology for memory impairment 3. F/u with orthopedics, continue outpatient PT for left knee  Discharge Diagnoses:  Active Hospital Problems   Diagnosis Date Noted  . Acute encephalopathy 03/30/2018  . Hyponatremia   . PAF (paroxysmal atrial fibrillation) (HCC)   . CAD (coronary artery disease) 03/31/2018  . S/P left TKA 03/09/2018  . Hyperlipidemia 03/03/2015    Resolved Hospital Problems  No resolved problems to display.    Discharge Condition: stable  Diet recommendation: regular diet  Filed Weights   03/31/18 0426 03/31/18 1457  Weight: 76 kg 73.4 kg    History of present illness: (per admitting MD Dr Toniann Fail) PCP: Chilton Greathouse, MD  Patient coming from: Home.  Chief Complaint: Confusion.  History obtained from patient's daughter and patient's wife.  Patient is confused.  HPI: Stuart Flores is a 84 y.o. male with history of CAD status post stenting and atrial fibrillation on apixaban was brought to the ER after patient was found to be increasingly confused since yesterday morning.  Patient has had a recent left knee replacement on December 5 by Dr. Charlann Boxer orthopedic surgeon.  As per the family patient has been doing fine until 24 hours ago when patient started getting more confused mostly after yesterday's breakfast.  Patient was taken to the rehab for his left knee surgery over there patient was not following commands and appeared completely confused and was advised to take him to his orthopedic surgeon and orthopedic surgeon advised to bring him to the ER.  Prior to coming patient had complained of some  headache has not had any nausea vomiting fever chills chest pain shortness of breath or any productive cough.  Nobody sick at home and has not had any recent sick visits or travel.  Patient has been taking hydrocodone for his left knee pain and patient's wife is in position of the medication.  Last dose was yesterday morning prior to the breakfast.  No seizure-like activities or any incontinence of urine or bowel.  ED Course: In the ER CT head was unremarkable patient was afebrile no leukocytosis patient had required some lorazepam for agitation.  Also was given some morphine for pain of the left knee.  Left knee was mildly swollen mildly warm to touch but no definite signs of any erythema.  Doppler of the left lower extremity was done which was negative for any DVT given the swelling.  Chest x-ray unremarkable UA did not show anything acute.  Urine drug screen is pending.  On my exam patient is following commands but encephalopathy at times speaking Spanish which is new for the patient as per the family.   Hospital Course:  Principal Problem:   Acute encephalopathy Active Problems:   Hyperlipidemia   S/P left TKA   CAD (coronary artery disease)   Hyponatremia   PAF (paroxysmal atrial fibrillation) (HCC)   Acute metabolic encephalopathy: -acutely confused at outpatient PT , was sent to hospital and admitted -MRI brain no acute findings , but showed "Atrophy and white matter disease are moderately advanced, even for age. This likely reflects the sequela of chronic microvascular ischemia." -no infection identified, blood culture no growth, urine  culture insignificant growth,cxr no acute findings -tele unremarkable -avoid narcotics, ( he was on prn narcotics since after his left knee surgery), correct sodium -acute confusion resolved, he is to follow up with pcp and neurology for memory loss.    Hyponatremia Hold off DDAVP for now ( reports urology prescribed DDAVP for nocturia) He  received gentle hydration, encourage oral intake Sodium 130 at discharge, confusion has resolved. F/u with pcp , repeat bmp, monitor sodium level.   PAF Tele sinus rhythm resume home meds toprol-xl, apixaban  Recent left knee surgery with mild swelling of the left knee. Dopplers were negative for DVT. Appreciate ortho input He is to follow up with ortho and continue outpatient PT  Chronic anemia: normocytic  No overt sign of bleeding Discharge on iron supplement every other day, pcp follow up.  Code Status: full  Family Communication: patient and family  Disposition Plan: home    Consultants:  none  Procedures:  none  Antibiotics:  none   Discharge Exam: BP 140/73 (BP Location: Left Arm)   Pulse 62   Temp 98.3 F (36.8 C) (Oral)   Resp 16   Ht 6' (1.829 m)   Wt 73.4 kg   SpO2 100%   BMI 21.95 kg/m   General: NAD, aaox3 Cardiovascular: RRR Respiratory: CTABL Musculoskeletal: No Edema, left knee post op changes, surgical wound healing well Neuro: alert, oriented     Discharge Instructions You were cared for by a hospitalist during your hospital stay. If you have any questions about your discharge medications or the care you received while you were in the hospital after you are discharged, you can call the unit and asked to speak with the hospitalist on call if the hospitalist that took care of you is not available. Once you are discharged, your primary care physician will handle any further medical issues. Please note that NO REFILLS for any discharge medications will be authorized once you are discharged, as it is imperative that you return to your primary care physician (or establish a relationship with a primary care physician if you do not have one) for your aftercare needs so that they can reassess your need for medications and monitor your lab values.  Discharge Instructions    Diet general   Complete by:  As directed    Increase activity  slowly   Complete by:  As directed      Allergies as of 04/02/2018   No Known Allergies     Medication List    STOP taking these medications   desmopressin 0.1 MG tablet Commonly known as:  DDAVP   docusate sodium 100 MG capsule Commonly known as:  COLACE   HYDROcodone-acetaminophen 7.5-325 MG tablet Commonly known as:  NORCO   methocarbamol 500 MG tablet Commonly known as:  ROBAXIN     TAKE these medications   acetaminophen 325 MG tablet Commonly known as:  TYLENOL Take 2 tablets (650 mg total) by mouth every 6 (six) hours as needed for mild pain (or Fever >/= 101).   alfuzosin 10 MG 24 hr tablet Commonly known as:  UROXATRAL Take 10 mg by mouth at bedtime.   apixaban 5 MG Tabs tablet Commonly known as:  ELIQUIS Take 1 tablet (5 mg total) by mouth 2 (two) times daily. What changed:  when to take this   atorvastatin 40 MG tablet Commonly known as:  LIPITOR Take 40 mg by mouth daily. In the morning   ferrous sulfate 325 (65 FE) MG tablet  Commonly known as:  FERROUSUL Take 1 tablet (325 mg total) by mouth every Monday, Wednesday, and Friday. Start taking on:  April 03, 2018 What changed:  when to take this   metoprolol succinate 25 MG 24 hr tablet Commonly known as:  TOPROL-XL Take 12.5 mg by mouth daily.   multivitamin with minerals Tabs tablet Take 1 tablet by mouth daily.   nitroGLYCERIN 0.4 MG SL tablet Commonly known as:  NITROSTAT Place 0.4 mg under the tongue every 5 (five) minutes as needed for chest pain (No more than 3 doses).   polyethylene glycol packet Commonly known as:  MIRALAX / GLYCOLAX Take 17 g by mouth 2 (two) times daily. What changed:  when to take this   PROBIOTIC DAILY PO Take 1 capsule by mouth daily.      No Known Allergies Follow-up Information    Avva, Ravisankar, MD Follow up in 1 week(s).   Specialty:  Internal Medicine Why:  hospital discharge follow up, repeat labs including cbc/bmp, follow sodium  level. Contact information: [address] [place] [address] [phone]        [facility] Follow up in 3 week(s).   Why:  for confusion, memory issues Contact information: [address]     [address] [place] [address] [phone]           The results of significant diagnostics from this hospitalization (including imaging, microbiology, ancillary and laboratory) are listed below for reference.    Significant Diagnostic Studies: Dg Chest 2 View  Result Date: 03/30/2018 CLINICAL DATA:  Confusion EXAM: CHEST - 2 VIEW COMPARISON:  05/16/2003 FINDINGS: Heart is normal size. Tortuous aorta. Lungs clear. No effusions or pneumothorax. No acute bony abnormality. IMPRESSION: No active cardiopulmonary disease. Electronically Signed   By: Charlett Nose M.D.   On: 03/30/2018 18:08   Ct Head Wo Contrast  Result Date: 03/30/2018 CLINICAL DATA:  Altered level of consciousness. EXAM: CT HEAD WITHOUT CONTRAST TECHNIQUE: Contiguous axial images were obtained from the base of the skull through the vertex without intravenous contrast. COMPARISON:  None. FINDINGS: Brain: There is atrophy and chronic small vessel disease changes. No acute intracranial abnormality. Specifically, no hemorrhage, hydrocephalus, mass lesion, acute infarction, or significant intracranial injury. Vascular: No hyperdense vessel or unexpected calcification. Skull: No acute calvarial abnormality. Sinuses/Orbits: Visualized paranasal sinuses and mastoids clear. Orbital soft tissues unremarkable. Other: None IMPRESSION: Atrophy, chronic microvascular disease. No acute intracranial abnormality. Electronically Signed   By: Charlett Nose M.D.   On: 03/30/2018 18:00   Mr Brain Wo Contrast  Result Date: 03/31/2018 CLINICAL DATA:  Progressive confusion since yesterday morning. Altered level of consciousness. EXAM: MRI HEAD WITHOUT CONTRAST TECHNIQUE: Multiplanar, multiecho pulse sequences  of the brain and surrounding structures were obtained without intravenous contrast. COMPARISON:  CT head without contrast 03/30/2018 FINDINGS: Brain: Diffusion-weighted images demonstrate no acute or subacute infarction. Acute hemorrhage or mass lesion is present. Ventricles are proportionate to the degree of atrophy. Moderate periventricular and subcortical white matter changes are present bilaterally. Dilated perivascular spaces are present within the basal ganglia. White matter changes extend into the brainstem. The cerebellum is unremarkable. The internal auditory canals are within normal limits. Vascular: Flow is present in the major intracranial arteries. Bilateral lens replacements are present. Globes and orbits are otherwise unremarkable Skull and upper cervical spine: Craniocervical junction is normal. Upper cervical spine is within normal limits. A relatively empty sella is noted. Sinuses/Orbits: Mild mucosal thickening bilaterally is present in the ethmoid  air cells paranasal sinuses and mastoid air cells are otherwise clear. IMPRESSION: 1. No acute or focal abnormality to explain acute confusion or altered level of consciousness. 2. Atrophy and white matter disease are moderately advanced, even for age. This likely reflects the sequela of chronic microvascular ischemia. 3. Minimal sinus disease. Electronically Signed   By: Marin Robertshristopher  Mattern M.D.   On: 03/31/2018 11:56   Vas [place]as Lower Extremity Venous (dvt) (only Mc & Wl)  Result Date: 03/31/2018  Lower Venous Study Indications: Swelling, and Edema.  Limitations: Pain tolerance. Performing Technologist: Blanch MediaMegan Riddle RVS  Examination Guidelines: A complete evaluation includes B-mode imaging, spectral Doppler, color Doppler, and power Doppler as needed of all accessible portions of each vessel. Bilateral testing is considered an integral part of a complete examination. Limited examinations for reoccurring indications may be performed as noted.  Left  Venous Findings: +---------+---------------+---------+-----------+----------+-------------------+          CompressibilityPhasicitySpontaneityPropertiesSummary             +---------+---------------+---------+-----------+----------+-------------------+ CFV      Full           Yes      Yes                                      +---------+---------------+---------+-----------+----------+-------------------+ SFJ      Full                                                             +---------+---------------+---------+-----------+----------+-------------------+ FV Prox  Full                                                             +---------+---------------+---------+-----------+----------+-------------------+ FV Mid   Full                                                             +---------+---------------+---------+-----------+----------+-------------------+ FV Distal               Yes      Yes                  unable to compress                                                        due to pain                                                               tolerance           +---------+---------------+---------+-----------+----------+-------------------+  PFV      Full                                                             +---------+---------------+---------+-----------+----------+-------------------+ POP      Full           Yes      Yes                                      +---------+---------------+---------+-----------+----------+-------------------+ PTV      Full                                                             +---------+---------------+---------+-----------+----------+-------------------+ PERO                                                  Not visualized      +---------+---------------+---------+-----------+----------+-------------------+    Summary: Left: There is no evidence of deep vein thrombosis in the  lower extremity. No cystic structure found in the popliteal fossa.  *See table(s) above for measurements and observations. Electronically signed by Gretta Began MD on 03/31/2018 at 2:31:05 PM.    Final     Microbiology: Recent Results (from the past 240 hour(s))  Urine culture     Status: Abnormal   Collection Time: 03/30/18  5:03 PM  Result Value Ref Range Status   Specimen Description   Final    URINE, CLEAN CATCH Performed at [facility], [address], [place], [place] [address]    Special Requests   Final    NONE Performed at [facility], [address], [place], [place] [address]    Culture (A)  Final    <10,000 COLONIES/mL INSIGNIFICANT GROWTH Performed at [facility], [address] [place], [place] [address]    Report Status 04/01/2018 FINAL  Final  Culture, blood (routine x 2)     Status: None (Preliminary result)   Collection Time: 03/30/18  5:03 PM  Result Value Ref Range Status   Specimen Description   Final    BLOOD LEFT ANTECUBITAL Performed at [facility], [address] [place], [place] [address]    Special Requests   Final    BOTTLES DRAWN AEROBIC AND ANAEROBIC Blood Culture adequate volume Performed at [facility], [address], [place], [place] [address]    Culture   Final    NO GROWTH 2 DAYS Performed at [facility], [address] [place], [place] [address]    Report Status PENDING  Incomplete  Culture, blood (routine x 2)     Status: None (Preliminary result)   Collection Time: 03/30/18  5:22 PM  Result Value Ref Range Status   Specimen Description   Final    BLOOD LEFT HAND Performed at [facility], [address]  [place], [place] [address]    Special Requests   Final    BOTTLES DRAWN AEROBIC ONLY Blood Culture adequate volume Performed at [facility], [address] [place], [place] [address]    Culture    Final    NO GROWTH 2 DAYS Performed at [facility], [address] [place], [place] [address]    Report Status PENDING  Incomplete  MRSA PCR Screening     Status: None   Collection Time: 03/31/18  2:44 AM  Result Value Ref Range Status   MRSA by PCR NEGATIVE NEGATIVE Final    Comment:        The GeneXpert MRSA Assay (FDA approved for NASAL specimens only), is one component of a comprehensive MRSA colonization surveillance program. It is not intended to diagnose MRSA infection nor to guide or monitor treatment for MRSA infections. Performed at [facility], [address] [place], [place] [address]      Labs: Basic Metabolic Panel: Recent Labs  Lab 03/30/18 1702 03/31/18 0328 04/02/18 0556  NA 132* 131* 130*  K 4.4 3.8 3.6  CL 102 104 102  CO2 19* 19* 20*  GLUCOSE 95 90 88  BUN 18 15 8   CREATININE 0.94 0.78 0.61  CALCIUM 9.2 8.1* 8.2*  MG  --   --  1.9   Liver Function Tests: Recent Labs  Lab 03/30/18 1702 03/31/18 0328  AST 27 29  ALT 19 16  ALKPHOS 61 52  BILITOT 0.9 0.8  PROT 5.9* 5.4*  ALBUMIN 3.9 3.4*   No results for input(s): LIPASE, AMYLASE in the last 168 hours. Recent Labs  Lab 03/31/18 0328  AMMONIA 21   CBC: Recent Labs  Lab 03/30/18 1702 03/31/18 0328 04/02/18 0556  WBC 8.3 7.3 7.4  NEUTROABS  --  4.2 4.1  HGB 10.6* 9.5* 9.9*  HCT 32.0* 29.9* 30.2*  MCV 89.1 93.7 92.4  PLT 491* 364 366   Cardiac Enzymes: Recent Labs  Lab 03/30/18 1703 03/31/18 0328  TROPONINI <0.03 <0.03   BNP: BNP (last 3 results) No results for input(s): BNP in the last 8760 hours.  ProBNP (last 3 results) No results for input(s): PROBNP in the last 8760 hours.  CBG: Recent Labs  Lab 04/01/18 0556 04/01/18 1133 04/01/18 1717 04/01/18 2348 04/02/18 0715  GLUCAP 89 80 118* 93 81       Signed:  Albertine GratesFang Marina Boerner MD, PhD  [facility] 04/02/2018, 9:49 AM

## 2018-04-02 NOTE — Progress Notes (Signed)
     Subjective:      Patient's cognition appears to be much approved as compared to prior to his admission.  Patient is aware of my name and position, which was not the case.  He seems to be perplexed to why he had this incident as he knows all the tests have been returning as normal.  He is looking forward to returning home and getting back to rehabbing the knee.   Objective:   VITALS:   Vitals:   04/01/18 2137 04/02/18 0625  BP: 120/68 140/73  Pulse: 61 62  Resp: 16 16  Temp: 98.2 F (36.8 C) 98.3 F (36.8 C)  SpO2: 99% 100%    Dorsiflexion/Plantar flexion intact Incision: dressing C/D/I No cellulitis present Compartment soft  LABS Recent Labs    03/30/18 1702 03/31/18 0328 04/02/18 0556  HGB 10.6* 9.5* 9.9*  HCT 32.0* 29.9* 30.2*  WBC 8.3 7.3 7.4  PLT 491* 364 366    Recent Labs    03/30/18 1702 03/31/18 0328 04/02/18 0556  NA 132* 131* 130*  K 4.4 3.8 3.6  BUN 18 15 8   CREATININE 0.94 0.78 0.61  GLUCOSE 95 90 88     Assessment/Plan:     Up with therapy  Appreciate the attention he has been receiving from the medicine team    Anastasio AuerbachMatthew S. Serenna Deroy   PAC  04/02/2018, 8:53 AM

## 2018-04-02 NOTE — Plan of Care (Signed)
Discharge instructions reviewed with patient and wife, questions answered, verbalized understanding.  Patient transported to main entrance of the hospital to be taken home by wife.   

## 2018-04-04 LAB — CULTURE, BLOOD (ROUTINE X 2)
CULTURE: NO GROWTH
Culture: NO GROWTH
Special Requests: ADEQUATE
Special Requests: ADEQUATE

## 2018-05-31 ENCOUNTER — Other Ambulatory Visit: Payer: Self-pay

## 2018-05-31 ENCOUNTER — Encounter: Payer: Self-pay | Admitting: Neurology

## 2018-05-31 ENCOUNTER — Ambulatory Visit: Payer: Medicare Other | Admitting: Neurology

## 2018-05-31 VITALS — BP 106/64 | HR 70 | Resp 18 | Ht 72.0 in | Wt 163.6 lb

## 2018-05-31 DIAGNOSIS — G9341 Metabolic encephalopathy: Secondary | ICD-10-CM

## 2018-05-31 DIAGNOSIS — R413 Other amnesia: Secondary | ICD-10-CM | POA: Insufficient documentation

## 2018-05-31 NOTE — Progress Notes (Signed)
[facility]    Provider:  Dr Lucia Gaskins Referring Provider: Chilton Greathouse, MD Primary Care Provider:  Chilton Greathouse, MD  CC:  Acute metabolic encephalopathy, memory loss  HPI:  Stuart Flores is a 85 y.o. male here as requested by provider Avva, Ravisankar, MD for acute metabolic encephalopathy. PMHx CAF, Afib, Acute Encephalopathy, OA, HLD, hyponatremia.  Patient is here alone. He says he  had an episode of confusion.  He went in one day for PT after knee replacement and the therapist thought he looked funny and he wasn't really making a lot of sense when talking. He was taken to the emergency room.  He was on DDAVP with hyponatremia thought to be the cause. Since he stopped this medication he has been fine. He denies taking hydrocodone despite that being in his chart, he says he did not take it at home after his total knee replacement. His sodium was found to be 130 and the DDAVP was stopped inpatient. Workup was otherwise negative for infectious or other metabolic etiologies.No more episodes since Dece,ber when he was admitted.   Patient also says his wife feels he is forgetful. He has to write things down. He doesn't feel it is abnormal however. Feels like he is doing well, drives, take medications, his wife has always paid the bills. For example, today he forgot the date. He doesn't know the date has to look at the calendar. He knows the year and month. No FHx of alzheimers or dementia. He does not feel his memory is out of proportion to his age. He does not feel the need for any testing. No accidents in the home or car. Does not get lost. He denies losing things. Sometimes he travels alone. He is still socially active, photography is his hobby, he sees family every chance he gets, they travel multiple times together, they have traveled to [place] to see their grandchild.   Reviewed notes, labs and imaging from outside physicians, which showed:  Personally reviewed MRI of  the brain images and agree with the following: IMPRESSION: 1. No acute or focal abnormality to explain acute confusion or altered level of consciousness. 2. Atrophy and white matter disease are moderately advanced, even for age. This likely reflects the sequela of chronic microvascular ischemia.  Inpatient labs end of December: TSH nml, Na 130 inpatient and baseline in the past 140. Cbc with anemia but normal WBCs.    Review of Systems: Patient complains of symptoms per HPI as well as the following symptoms: memory loss. Pertinent negatives and positives per HPI. All others negative.   Social History   Socioeconomic History  . Marital status: Married    Spouse name: Stuart Flores  . Number of children: 4  . Years of education: Not on file  . Highest education level: Master's degree (e.g., MA, MS, MEng, MEd, MSW, MBA)  Occupational History  . 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 Financial resource strain: Not on file  . Food insecurity:    Worry: Not on file    Inability: Not on file  . Transportation needs:    Medical: Not on file    Non-medical: Not on file  Tobacco Use  . Smoking status: Former Smoker    Packs/day: 1.00    Years: 9.00    Pack years: 9.00    Types: Cigarettes  . Smokeless tobacco: Never Used  . Tobacco comment: stopped smoking cigarettes at age 32  Substance  and Sexual Activity  . Alcohol use: Yes    Alcohol/week: 9.0 standard drinks    Types: 9 Glasses of wine per week    Comment: weekly  . Drug use: Never  . Sexual activity: Never  Lifestyle  . Physical activity:    Days per week: Not on file    Minutes per session: Not on file  . Stress: Not on file  Relationships  . Social connections:    Talks on phone: Not on file    Gets together: Not on file    Attends religious service: Not on file    Active member of club or organization: Not on file    Attends meetings of clubs or organizations: Not on file     Relationship status: Not on file  . Intimate partner violence:    Fear of current or ex partner: Not on file    Emotionally abused: Not on file    Physically abused: Not on file    Forced sexual activity: Not on file  Other Topics Concern  . Not on file  Social History Narrative  . Not on file    Family History  Problem Relation Age of Onset  . Heart attack Mother   . Arthritis/Rheumatoid Mother   . Pancreatic cancer Father   . Other Brother        car accident    Past Medical History:  Diagnosis Date  . Arthritis    ostearthritis- back, knee  . BPH with obstruction/lower urinary tract symptoms    urologist-  dr r. evans @[facility]   . Bradycardia   . Coronary artery disease    cardiologist-- dr Jacinto Halim  . History of glaucoma    per pt no issues since cataract extraction's bilaterally 2015  . History of squamous cell carcinoma excision    face x2  . PAF (paroxysmal atrial fibrillation) [facility])    cardiologist-- dr Jacinto Halim  . S/P drug eluting coronary stent placement 03/04/2015   DES x1 to mid CFx and DES x1 to mid LAD  . Wears glasses   . Wears hearing aid in both ears     Patient Active Problem List   Diagnosis Date Noted  . Memory loss 05/31/2018  . Hyponatremia   . PAF (paroxysmal atrial fibrillation) (HCC)   . CAD (coronary artery disease) 03/31/2018  . Acute encephalopathy 03/30/2018  . S/P left TKA 03/09/2018  . S/P knee replacement 03/09/2018  . 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 OA (osteoarthritis) of knee 10/27/2015  . Post PTCA 03/04/2015  . Angina effort 03/03/2015  . Hyperlipidemia 03/03/2015    Past Surgical History:  Procedure Laterality Date  . CARDIAC CATHETERIZATION N/A 03/04/2015   Procedure: Left Heart Cath and Coronary Angiography;  Surgeon: Yates Decamp, MD;  Location: [facility] INVASIVE CV LAB;  Service: Cardiovascular;  Laterality: N/A;  . CARDIAC CATHETERIZATION  03/04/2015   Procedure: Coronary Stent Intervention;  Surgeon: Yates Decamp, MD;   Location: [facility] INVASIVE CV LAB;  Service: Cardiovascular;;  DES to mCFx and mLAD  . CARDIOVASCULAR STRESS TEST  12-27-2016   dr Jacinto Halim   Low risk nuclear study w/ no ischemia/  normal LV function and wall function, ef 67%  . CATARACT EXTRACTION W/ INTRAOCULAR LENS  IMPLANT, BILATERAL Bilateral 2015  . HAND SURGERY Left early 2000s   "thumb surgery for Tendon pair"  . KNEE ARTHROSCOPY Bilateral   . STERIOD INJECTION Left 11/24/2017   Procedure: LEFT KNEE CORTISONE INJECTION;  Surgeon: Charlann Boxer,  Molli Hazard, MD;  Location: [facility];  Service: Orthopedics;  Laterality: Left;  . TONSILLECTOMY  child  . TOTAL KNEE ARTHROPLASTY Right 10/27/2015   Procedure: TOTAL RIGHT KNEE ARTHROPLASTY;  Surgeon: Ollen Gross, MD;  Location: [facility];  Service: Orthopedics;  Laterality: Right;  . TOTAL KNEE ARTHROPLASTY Left 03/09/2018   Procedure: TOTAL KNEE ARTHROPLASTY;  Surgeon: Durene Romans, MD;  Location: [facility];  Service: Orthopedics;  Laterality: Left;  70 mins with block  . TOTAL KNEE REVISION WITH SCAR DEBRIDEMENT/PATELLA REVISION WITH POLY EXCHANGE Right 11/24/2017   Procedure: RIGHT KNEE OPEN ARTHROTOMY WITH SCAR DEBRIDEMENT AND POSSIBLE POLY EXCHANGE;  Surgeon: Durene Romans, MD;  Location: [facility];  Service: Orthopedics;  Laterality: Right;  90 mins  . TRANSTHORACIC ECHOCARDIOGRAM  01-20-2017   dr Jacinto Halim   mild concentric LVH, ef 55%/  trace AR/  mild MR, TR and  PR    Current Outpatient Medications  Medication Sig Dispense Refill  . acetaminophen (TYLENOL) 325 MG tablet Take 2 tablets (650 mg total) by mouth every 6 (six) hours as needed for mild pain (or Fever >/= 101). 30 tablet 0  . apixaban (ELIQUIS) 5 MG TABS tablet Take 1 tablet (5 mg total) by mouth 2 (two) times daily. (Patient taking differently: Take 5 mg by mouth daily. ) 60 tablet 0  . atorvastatin (LIPITOR) 40 MG tablet Take 40 mg by mouth daily. In the morning    . docusate sodium (COLACE) 100 MG capsule Take 100 mg by mouth 2 (two) times daily.    .  ferrous sulfate (FERROUSUL) 325 (65 FE) MG tablet Take 1 tablet (325 mg total) by mouth every Monday, Wednesday, and Friday. 30 tablet 0  . metoprolol succinate (TOPROL-XL) 25 MG 24 hr tablet Take 12.5 mg by mouth daily.    . Multiple Vitamin (MULTIVITAMIN WITH MINERALS) TABS tablet Take 1 tablet by mouth daily.    . nitroGLYCERIN (NITROSTAT) 0.4 MG SL tablet Place 0.4 mg under the tongue every 5 (five) minutes as needed for chest pain (No more than 3 doses).     . Probiotic Product (PROBIOTIC DAILY PO) Take 1 capsule by mouth daily.     No current facility-administered medications for this visit.     Allergies as of 05/31/2018  . (No Known Allergies)    Vitals: BP 106/64   Pulse 70   Resp 18   Ht 6' (1.829 m)   Wt 163 lb 9.6 oz (74.2 kg)   BMI 22.19 kg/m  Last Weight:  Wt Readings from Last 1 Encounters:  05/31/18 163 lb 9.6 oz (74.2 kg)   Last Height:   Ht Readings from Last 1 Encounters:  05/31/18 6' (1.829 m)     Physical exam: Exam: Gen: NAD, conversant, well nourised, obese, well groomed                     CV: RRR, no MRG. No Carotid Bruits. No peripheral edema, warm, nontender Eyes: Conjunctivae clear without exudates or hemorrhage  Neuro: Detailed Neurologic Exam  Speech:    Speech is normal; fluent and spontaneous with normal comprehension.  Cognition:    The patient is oriented to person, place, and time;     recent and remote memory intact;     language fluent;     normal attention, concentration,     fund of knowledge Cranial Nerves:    The pupils are equal, round, and reactive to light. The fundi are normal and spontaneous venous  pulsations are present. Visual fields are full to finger confrontation. Extraocular movements are intact. Trigeminal sensation is intact and the muscles of mastication are normal. The face is symmetric. The palate elevates in the midline. Hearing intact. Voice is normal. Shoulder shrug is normal. The tongue has normal motion  without fasciculations.   Coordination:    Normal finger to nose and heel to shin. Normal rapid alternating movements.   Gait:    Heel-toe and tandem gait are normal.   Motor Observation:    No asymmetry, no atrophy, and no involuntary movements noted. Tone:    Normal muscle tone.    Posture:    Posture is normal. normal erect    Strength:    Strength is V/V in the upper and lower limbs.      Sensation: intact to LT     Reflex Exam:  DTR's:    Deep tendon reflexes in the upper and lower extremities are normal bilaterally.   Toes:    The toes are downgoing bilaterally.   Clonus:    Clonus is absent.    Assessment/Plan:  LANNIE YUSUF is a 85 y.o. male here as requested by provider Avva, Ravisankar, MD for acute metabolic encephalopathy. PMHx CAF, Afib, Acute Encephalopathy, OA, HLD, hyponatremia.    Acute metabolic encephalopathy admitted to [facility] dec 2019: Likely due to hyponatremia secondary to Desmopressin use. Resolved, no more episodes.  Memory loss: Patient denies any memory loss not appropriate for age. Wife appears to think his memory loss is more significant but she is not at the appointment today. Given his MRI with advanced small-vessel microvascular ischemic changes and atrophy may have MCI due to vascular cause/cerebrovascular disease. Suggested formal neurocognitive testing and gave him information on this testing to share with his wife.   Will order formal neurocognitive testing  Orders Placed This Encounter  Procedures  . Ambulatory referral to Neuropsychology   Cc: Stuart Greathouse, MD,    Naomie Dean, MD  [facility] [address] [place], [place] [address]  Phone [phone] Fax [phone]

## 2018-05-31 NOTE — Patient Instructions (Signed)
Can recommend Formal Neurocognitive Testing

## 2018-06-09 ENCOUNTER — Other Ambulatory Visit: Payer: Self-pay

## 2018-06-09 MED ORDER — METOPROLOL SUCCINATE ER 25 MG PO TB24: 12.5000 mg | ORAL_TABLET | Freq: Every day | ORAL | 1 refills | Status: DC

## 2018-10-02 ENCOUNTER — Other Ambulatory Visit: Payer: Self-pay | Admitting: Cardiology

## 2018-12-04 ENCOUNTER — Encounter: Payer: Self-pay | Admitting: Cardiology

## 2018-12-04 ENCOUNTER — Other Ambulatory Visit: Payer: Self-pay

## 2018-12-04 ENCOUNTER — Ambulatory Visit (INDEPENDENT_AMBULATORY_CARE_PROVIDER_SITE_OTHER): Payer: Medicare Other | Admitting: Cardiology

## 2018-12-04 VITALS — BP 115/66 | HR 53 | Ht 72.0 in | Wt 165.9 lb

## 2018-12-04 DIAGNOSIS — I480 Paroxysmal atrial fibrillation: Secondary | ICD-10-CM | POA: Diagnosis not present

## 2018-12-04 DIAGNOSIS — I2510 Atherosclerotic heart disease of native coronary artery without angina pectoris: Secondary | ICD-10-CM

## 2018-12-04 DIAGNOSIS — R001 Bradycardia, unspecified: Secondary | ICD-10-CM | POA: Diagnosis not present

## 2018-12-04 NOTE — Progress Notes (Signed)
Primary Physician/Referring:  [name], MD  Patient ID: Stuart Flores, male    DOB: 02-16-1934, 85 y.o.   MRN: [id number]  Chief Complaint  Patient presents with   Atrial Fibrillation   Follow-up    68mo  Coronary Artery Disease   HPI:    Stuart Flores is a 85y.o. Caucasian gentleman, h/o CAD, on 03/03/2015 underwent coronary angiography and stenting to subtotally occluded dominant circumflex coronary artery and mid LAD by implantation of 4.0 x 30 mm and 2.75 x 22 mm resolute DES respectively on 03/04/2015.   Past medical history significant for paroxysmal atrial fibrillation.  There is no history of hypertension or hyperlipidemia. Now presents for 6 month follow-up. He has chronic mild leg edema and uses support stockings regularly.  Recently he has not had any significant travel and has not noticed any significant leg edema he has sprained his left hip and has some thigh muscle sprain but otherwise no symptoms today.  Past Medical History:  Diagnosis Date   Arthritis    ostearthritis- back, knee   BPH with obstruction/lower urinary tract symptoms    urologist-  dr rAlfonso Patten evans '@[facility]'    Bradycardia    Coronary artery disease    cardiologist-- dr gEinar Gip  History of glaucoma    per pt no issues since cataract extraction's bilaterally 2015   History of squamous cell carcinoma excision    face x2   PAF (paroxysmal atrial fibrillation) ([facility]    cardiologist-- dr gEinar Gip  S/P drug eluting coronary stent placement 03/04/2015   DES x1 to mid CFx and DES x1 to mid LAD   Wears glasses    Wears hearing aid in both ears    Past Surgical History:  Procedure Laterality Date   CARDIAC CATHETERIZATION N/A 03/04/2015   Procedure: Left Heart Cath and Coronary Angiography;  Surgeon: JAdrian Prows MD;  [facility];  Service: Cardiovascular;  Laterality: N/A;   CARDIAC CATHETERIZATION  03/04/2015   Procedure: Coronary Stent Intervention;  Surgeon: JAdrian Prows MD;  [facility];  Service: Cardiovascular;;  DES to mCFx and mLAD   CARDIOVASCULAR STRESS TEST  12-27-2016   dr gEinar Gip  Low risk nuclear study w/ no ischemia/  normal LV function and wall function, ef 67%   CATARACT EXTRACTION W/ INTRAOCULAR LENS  IMPLANT, BILATERAL Bilateral 2015   HAND SURGERY Left early 2000s   "thumb surgery for Tendon pair"   KNEE ARTHROSCOPY Bilateral    STERIOD INJECTION Left 11/24/2017   Procedure: LEFT KNEE CORTISONE INJECTION;  Surgeon: OParalee Cancel MD;  [facility];  Service: Orthopedics;  Laterality: Left;   TONSILLECTOMY  child   TOTAL KNEE ARTHROPLASTY Right 10/27/2015   Procedure: TOTAL RIGHT KNEE ARTHROPLASTY;  Surgeon: FGaynelle Arabian MD;  [facility];  Service: Orthopedics;  Laterality: Right;   TOTAL KNEE ARTHROPLASTY Left 03/09/2018   Procedure: TOTAL KNEE ARTHROPLASTY;  Surgeon: OParalee Cancel MD;  [facility];  Service: Orthopedics;  Laterality: Left;  70 mins with block   TOTAL KNEE REVISION WITH SCAR DEBRIDEMENT/PATELLA REVISION WITH POLY EXCHANGE Right 11/24/2017   Procedure: RIGHT KNEE OPEN ARTHROTOMY WITH SCAR DEBRIDEMENT AND POSSIBLE POLY EXCHANGE;  Surgeon: OParalee Cancel MD;  [facility];  Service: Orthopedics;  Laterality: Right;  90 mins   TRANSTHORACIC ECHOCARDIOGRAM  01-20-2017   dr gEinar Gip  mild concentric LVH, ef 55%/  trace AR/  mild MR, TR and  PR  Social History   Socioeconomic History   Marital status: Married    Spouse name: Manuela Schwartz   Number of children: 4   Years of education: Not on file   Highest education level: Master's degree (e.g., MA, MS, MEng, MEd, MSW, MBA)  Occupational History   Occupation: English as a second language teacher    Comment: [place] resource strain: Not on file   Food insecurity    Worry: Not on file    Inability: Not on file   Transportation needs    Medical: Not on file    Non-medical: Not on file  Tobacco Use    Smoking status: Former Smoker    Packs/day: 1.00    Years: 9.00    Pack years: 9.00    Types: Cigarettes    Quit date: 12/04/1963    Years since quitting: 55.0   Smokeless tobacco: Never Used   Tobacco comment: stopped smoking cigarettes at age 54  Substance and Sexual Activity   Alcohol use: Yes    Alcohol/week: 9.0 standard drinks    Types: 9 Glasses of wine per week    Comment: wine or beer daily   Drug use: Never   Sexual activity: Never  Lifestyle   Physical activity    Days per week: Not on file    Minutes per session: Not on file   Stress: Not on file  Relationships   Social connections    Talks on phone: Not on file    Gets together: Not on file    Attends religious service: Not on file    Active member of club or organization: Not on file    Attends meetings of clubs or organizations: Not on file    Relationship status: Not on file   Intimate partner violence    Fear of current or ex partner: Not on file    Emotionally abused: Not on file    Physically abused: Not on file    Forced sexual activity: Not on file  Other Topics Concern   Not on file  Social History Narrative   Not on file   ROS  Review of Systems  Constitution: Negative for chills, decreased appetite, malaise/fatigue and weight gain.  Cardiovascular: Positive for leg swelling. Negative for dyspnea on exertion and syncope.  Endocrine: Negative for cold intolerance.  Hematologic/Lymphatic: Does not bruise/bleed easily.  Musculoskeletal: Positive for joint pain (bilateral knee) and muscle cramps (at night and left hip after recent sprain). Negative for joint swelling.  Gastrointestinal: Negative for abdominal pain, anorexia, change in bowel habit, hematochezia and melena.  Neurological: Negative for headaches and light-headedness.  Psychiatric/Behavioral: Negative for depression and substance abuse.  All other systems reviewed and are negative.  Objective  Blood pressure 115/66, pulse  (!) 53, height 6' (1.829 m), weight 165 lb 14.4 oz (75.3 kg), SpO2 93 %. Body mass index is 22.5 kg/m.   Physical Exam  Constitutional: He appears well-developed and well-nourished. No distress.  HENT:  Head: Atraumatic.  Eyes: Conjunctivae are normal.  Neck: Neck supple. No JVD present. No thyromegaly present.  Cardiovascular: Normal rate, regular rhythm, normal heart sounds and intact distal pulses. Exam reveals no gallop.  No murmur heard. Pulses:      Carotid pulses are 2+ on the right side with bruit and 2+ on the left side with bruit.      Radial pulses are 2+ on the right side and 2+ on the left side.  Popliteal pulses are 2+ on the right side and 2+ on the left side.       Dorsalis pedis pulses are 0 on the right side and 0 on the left side.       Posterior tibial pulses are 0 on the right side and 0 on the left side.  Loss of hair and varicose veins are present in bilateral lower extremity.  No skin breakdown. trace+ bilateral  edema.  Pulmonary/Chest: Effort normal and breath sounds normal.  Abdominal: Soft. Bowel sounds are normal.  Musculoskeletal: Normal range of motion.  Neurological: He is alert.  Skin: Skin is warm and dry.  Psychiatric: He has a normal mood and affect.   Radiology: No results found.  Laboratory examination:   Labs 04/02/2018: HB 9.9/HCT 30.2, platelets 3 and 66, normal indicis. BUN 8, creatinine 0.61, eGFR greater than 60 mL. Sodium 130. TSH normal.  Labs 02/09/2017: HB 13.1/HCT 39.5, platelets 268, normal indicis.  Recent Labs    03/30/18 1702 03/31/18 0328 04/02/18 0556  NA 132* 131* 130*  K 4.4 3.8 3.6  CL 102 104 102  CO2 19* 19* 20*  GLUCOSE 95 90 88  BUN '18 15 8  ' CREATININE 0.94 0.78 0.61  CALCIUM 9.2 8.1* 8.2*  GFRNONAA >60 >60 >60  GFRAA >60 >60 >60   CMP Latest Ref Rng & Units 04/02/2018 03/31/2018 03/30/2018  Glucose 70 - 99 mg/dL 88 90 95  BUN 8 - 23 mg/dL '8 15 18  ' Creatinine 0.61 - 1.24 mg/dL 0.61 0.78 0.94    Sodium 135 - 145 mmol/L 130(L) 131(L) 132(L)  Potassium 3.5 - 5.1 mmol/L 3.6 3.8 4.4  Chloride 98 - 111 mmol/L 102 104 102  CO2 22 - 32 mmol/L 20(L) 19(L) 19(L)  Calcium 8.9 - 10.3 mg/dL 8.2(L) 8.1(L) 9.2  Total Protein 6.5 - 8.1 g/dL - 5.4(L) 5.9(L)  Total Bilirubin 0.3 - 1.2 mg/dL - 0.8 0.9  Alkaline Phos 38 - 126 U/L - 52 61  AST 15 - 41 U/L - 29 27  ALT 0 - 44 U/L - 16 19   CBC Latest Ref Rng & Units 04/02/2018 03/31/2018 03/30/2018  WBC 4.0 - 10.5 K/uL 7.4 7.3 8.3  Hemoglobin 13.0 - 17.0 g/dL 9.9(L) 9.5(L) 10.6(L)  Hematocrit 39.0 - 52.0 % 30.2(L) 29.9(L) 32.0(L)  Platelets 150 - 400 K/uL 366 364 491(H)   Lipid Panel  No results found for: CHOL, TRIG, HDL, CHOLHDL, VLDL, LDLCALC, LDLDIRECT HEMOGLOBIN A1C No results found for: HGBA1C, MPG TSH Recent Labs    04/02/18 0556  TSH 1.017   Medications   Prior to Admission medications   Medication Sig Start Date End Date Taking? Authorizing Provider  acetaminophen (TYLENOL) 325 MG tablet Take 2 tablets (650 mg total) by mouth every 6 (six) hours as needed for mild pain (or Fever >/= 101). 04/02/18  Yes Florencia Reasons, MD  apixaban (ELIQUIS) 5 MG TABS tablet Take 1 tablet (5 mg total) by mouth 2 (two) times daily. Patient taking differently: Take 5 mg by mouth daily.  12/09/16  Yes Sherran Needs, NP  atorvastatin (LIPITOR) 40 MG tablet Take 40 mg by mouth daily. In the morning 03/03/15  Yes [provider]  docusate sodium (COLACE) 100 MG capsule Take 100 mg by mouth 2 (two) times daily.   Yes [provider]  ferrous sulfate (FERROUSUL) 325 (65 FE) MG tablet Take 1 tablet (325 mg total) by mouth every Monday, Wednesday, and Friday. 04/03/18  Yes Florencia Reasons,  MD  metoprolol succinate (TOPROL-XL) 25 MG 24 hr tablet TAKE 1/2 TABLET BY MOUTH EVERY DAY 10/02/18  Yes Adrian Prows, MD  Multiple Vitamin (MULTIVITAMIN WITH MINERALS) TABS tablet Take 1 tablet by mouth daily.   Yes [provider]  nitroGLYCERIN (NITROSTAT)  0.4 MG SL tablet Place 0.4 mg under the tongue every 5 (five) minutes as needed for chest pain (No more than 3 doses).  02/25/15  Yes [provider]  Probiotic Product (PROBIOTIC DAILY PO) Take 1 capsule by mouth daily.   Yes [provider]  methocarbamol (ROBAXIN) 500 MG tablet 1 TAB 3 TIMES A DAY AS NEEDED FOR BACK PAIN **CASH PAY** 11/23/18   [provider]     Current Outpatient Medications  Medication Instructions   acetaminophen (TYLENOL) 650 mg, Oral, Every 6 hours PRN   apixaban (ELIQUIS) 5 mg, Oral, 2 times daily   atorvastatin (LIPITOR) 40 mg, Oral, Daily, In the morning   docusate sodium (COLACE) 100 mg, Oral, 2 times daily   ferrous sulfate (FERROUSUL) 325 mg, Oral, Every M-W-F   methocarbamol (ROBAXIN) 500 MG tablet 1 TAB 3 TIMES A DAY AS NEEDED FOR BACK PAIN **CASH PAY**   metoprolol succinate (TOPROL-XL) 25 MG 24 hr tablet TAKE 1/2 TABLET BY MOUTH EVERY DAY   Multiple Vitamin (MULTIVITAMIN WITH MINERALS) TABS tablet 1 tablet, Oral, Daily   nitroGLYCERIN (NITROSTAT) 0.4 mg, Sublingual, Every 5 min PRN   predniSONE (DELTASONE) 20 mg, Oral, Daily with breakfast, Taper pack    Probiotic Product (PROBIOTIC DAILY PO) 1 capsule, Oral, Daily    Cardiac Studies:   Coronary angiogram 03/04/2015: Mid dominant CX 99% to 0% with 4x77m Resolute DES. Mid LAD 80% to 0% with 2.75x247mResolute DES.  Exercise myoview stress 12/27/2016: 1. The patient performed treadmill exercise using a Bruce protocol, completing 5:00 minutes, achieving 6.89 METS, normal hemodynamic response. The patient did not develop symptoms other than fatigue during the procedure. 2. The stress electrocardiogram showed sinus tachycardia, normal stress conduction, no stress arrhythmias and normal stress repolarization. No ischemic changes. 3. The overall quality of the study is excellent. There is no evidence of abnormal lung activity. Stress and rest SPECT images demonstrate  homogeneous tracer distribution throughout the myocardium. Gated SPECT imaging reveals normal myocardial thickening and wall motion. The left ventricular ejection fraction was normal (67%). This is a low risk study.  Echocardiogram 01/20/2017: Left ventricle cavity is normal in size. Mild concentric hypertrophy of the left ventricle. Normal global wall motion. Calculated EF 55%. Mild (Grade I) mitral regurgitation. Mild tricuspid regurgitation. Mild pulmonic regurgitation. No evidence of pulmonary hypertension. Paroxysmal atrial fibrillation with RVR (I48.0) Story: New onset A. Fib 12/09/16 on Eliquis. Seen by DoRoderic PalauBeing treated for Pneumonia  Assessment     ICD-10-CM   1. Coronary artery disease involving native coronary artery of native heart without angina pectoris  I25.10   2. Paroxysmal atrial fibrillation (HCC)  I48.0 EKG 12-Lead   CHA2DS2-VASc Score is 3.  Yearly risk of stroke: 3.2%. (Age and CAD)  3. Bradycardia by electrocardiogram  R00.1     EKG 12/04/2018: Marked sinus bradycardia at rate of 47 bpm otherwise normal EKG.   Recommendations:    Patient is here on a six-month office visit and follow-up of coronary artery disease, paroxysmal atrial fibrillation.  He is presently doing well and essentially remains asymptomatic without recurrence of palpitations or angina pectoris.  Also reviewed his labs from December 2019, all the labs are within normal limits.  He continues to remain active.  He had a complete physical examination today by Dr. Dagmar Hait, told to have completely normal labs with no change from prior labs.  No changes in her medications were done today, I'll see him back in 6 months or sooner if problems.  I could actually see him back on an annual basis but patient prefers to see me on a 6 monthly basis.   Adrian Prows, MD, [facility] 12/04/2018, 3:07 PM [facility] [place] Pager: [phone] Office: [phone] If no answer Cell [phone]

## 2019-01-19 ENCOUNTER — Other Ambulatory Visit: Payer: Self-pay

## 2019-01-19 DIAGNOSIS — E7800 Pure hypercholesterolemia, unspecified: Secondary | ICD-10-CM

## 2019-01-19 MED ORDER — ATORVASTATIN CALCIUM 40 MG PO TABS: 40.0000 mg | ORAL_TABLET | Freq: Every day | ORAL | 6 refills | Status: DC

## 2019-02-06 ENCOUNTER — Ambulatory Visit: Payer: Self-pay | Admitting: Orthopedic Surgery

## 2019-02-12 ENCOUNTER — Encounter: Payer: Self-pay | Admitting: Cardiology

## 2019-02-19 ENCOUNTER — Ambulatory Visit: Payer: Self-pay | Admitting: Orthopedic Surgery

## 2019-02-19 NOTE — H&P (Signed)
Subjective:    Stuart Flores is a pleasant Relatively healthy 85 year old male who was in his normal state of health until recently when he began experiencing low back pain and radicular left leg pain After lifting an AC unit. He is now requiring the use of a walker is now requiring tramadol for pain. He describes his left thigh traveling down his lateral thigh and into the front medial portion of his lower leg and into the top of his foot. He says that without tramadol he cannot function. He did attend formalized physical therapy, but this was not helpful and made his pain worse. Imaging studies are significant for left foraminal disc herniation impinging upon the left L4 nerve root. Because of his sudden decrease quality-of-life, his leg weakness, and has significant pain the patient would like to move forward with surgical intervention. He is scheduled for Left far lateral Discectomy L4-5 on 02/28/19 at [facility].  Patient Active Problem List   Diagnosis Date Noted  . Memory loss 05/31/2018  . Hyponatremia   . PAF (paroxysmal atrial fibrillation) (HCC)   . CAD (coronary artery disease) 03/31/2018  . Acute encephalopathy 03/30/2018  . S/P left TKA 03/09/2018  . S/P knee replacement 03/09/2018  . 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 OA (osteoarthritis) of knee 10/27/2015  . Post PTCA 03/04/2015  . Angina effort 03/03/2015  . Hyperlipidemia 03/03/2015   Past Medical History:  Diagnosis Date  . Arthritis    ostearthritis- back, knee  . BPH with obstruction/lower urinary tract symptoms    urologist-  dr r. evans @[facility]   . Bradycardia   . Coronary artery disease    cardiologist-- dr  . History of glaucoma    per pt no issues since cataract extraction's bilaterally 2015  . History of squamous cell carcinoma excision    face x2  . PAF (paroxysmal atrial fibrillation) [facility])    cardiologist-- dr [facility]  . S/P drug eluting coronary stent placement 03/04/2015   DES x1 to mid CFx and DES  x1 to mid LAD  . Wears glasses   . Wears hearing aid in both ears     Past Surgical History:  Procedure Laterality Date  . CARDIAC CATHETERIZATION N/A 03/04/2015   Procedure: Left Heart Cath and Coronary Angiography;  Surgeon: 03/06/2015, MD;  Location: [facility];  Service: Cardiovascular;  Laterality: N/A;  . CARDIAC CATHETERIZATION  03/04/2015   Procedure: Coronary Stent Intervention;  Surgeon: 03/06/2015, MD;  Location: [facility] INVASIVE CV LAB;  Service: Cardiovascular;;  DES to mCFx and mLAD  . CARDIOVASCULAR STRESS TEST  12-27-2016   dr 12-29-2016   Low risk nuclear study w/ no ischemia/  normal LV function and wall function, ef 67%  . CATARACT EXTRACTION W/ INTRAOCULAR LENS  IMPLANT, BILATERAL Bilateral 2015  . HAND SURGERY Left early 2000s   "thumb surgery for Tendon pair"  . KNEE ARTHROSCOPY Bilateral   . STERIOD INJECTION Left 11/24/2017   Procedure: LEFT KNEE CORTISONE INJECTION;  Surgeon: 11/26/2017, MD;  [facility]: [facility];  Service: Orthopedics;  Laterality: Left;  . TONSILLECTOMY  child  . TOTAL KNEE ARTHROPLASTY Right 10/27/2015   Procedure: TOTAL RIGHT KNEE ARTHROPLASTY;  Surgeon: 10/29/2015, MD;  [facility];  Service: Orthopedics;  Laterality: Right;  . TOTAL KNEE ARTHROPLASTY Left 03/09/2018   Procedure: TOTAL KNEE ARTHROPLASTY;  Surgeon: 14/08/2017, MD;  [facility];  Service: Orthopedics;  Laterality: Left;  70 mins with block  . TOTAL  KNEE REVISION WITH SCAR DEBRIDEMENT/PATELLA REVISION WITH POLY EXCHANGE Right 11/24/2017   Procedure: RIGHT KNEE OPEN ARTHROTOMY WITH SCAR DEBRIDEMENT AND POSSIBLE POLY EXCHANGE;  Surgeon: Durene Romanslin, Matthew, MD;  [facility];  Service: Orthopedics;  Laterality: Right;  90 mins  . TRANSTHORACIC ECHOCARDIOGRAM  01-20-2017   dr Jacinto Halimganji   mild concentric LVH, ef 55%/  trace AR/  mild MR, TR and  PR    Current Outpatient Medications  Medication Sig Dispense Refill Last Dose  . acetaminophen (TYLENOL) 325 MG tablet Take 2 tablets  (650 mg total) by mouth every 6 (six) hours as needed for mild pain (or Fever >/= 101). 30 tablet 0 Taking  . apixaban (ELIQUIS) 5 MG TABS tablet Take 1 tablet (5 mg total) by mouth 2 (two) times daily. (Patient taking differently: Take 5 mg by mouth daily. ) 60 tablet 0 Taking  . atorvastatin (LIPITOR) 40 MG tablet Take 1 tablet (40 mg total) by mouth daily. In the morning 30 tablet 6   . docusate sodium (COLACE) 100 MG capsule Take 100 mg by mouth 2 (two) times daily.   Taking  . ferrous sulfate (FERROUSUL) 325 (65 FE) MG tablet Take 1 tablet (325 mg total) by mouth every Monday, Wednesday, and Friday. 30 tablet 0 Taking  . methocarbamol (ROBAXIN) 500 MG tablet 1 TAB 3 TIMES A DAY AS NEEDED FOR BACK PAIN **CASH PAY**   Taking  . metoprolol succinate (TOPROL-XL) 25 MG 24 hr tablet TAKE 1/2 TABLET BY MOUTH EVERY DAY 30 tablet 2 Taking  . Multiple Vitamin (MULTIVITAMIN WITH MINERALS) TABS tablet Take 1 tablet by mouth daily.   Taking  . nitroGLYCERIN (NITROSTAT) 0.4 MG SL tablet Place 0.4 mg under the tongue every 5 (five) minutes as needed for chest pain (No more than 3 doses).    Taking  . predniSONE (DELTASONE) 20 MG tablet Take 20 mg by mouth daily with breakfast. Taper pack   Taking  . Probiotic Product (PROBIOTIC DAILY PO) Take 1 capsule by mouth daily.   Taking   No current facility-administered medications for this visit.    No Known Allergies  Social History   Tobacco Use  . Smoking status: Former Smoker    Packs/day: 1.00    Years: 9.00    Pack years: 9.00    Types: Cigarettes    Quit date: 12/04/1963    Years since quitting: 55.2  . Smokeless tobacco: Never Used  . Tobacco comment: stopped smoking cigarettes at age 85  Substance Use Topics  . Alcohol use: Yes    Alcohol/week: 9.0 standard drinks    Types: 9 Glasses of wine per week    Comment: wine or beer daily    Family History  Problem Relation Age of Onset  . Heart attack Mother   . Arthritis/Rheumatoid Mother   .  Pancreatic cancer Father   . Other Brother        car accident    Review of Systems As stated in HPI  Objective:   General: AAOX3, well developed and well nourished, NAD Ambulation: abnormal gait pattern, uses walker assistive device. Heart: Regular rate and rhythm, no rubs, murmurs, or gallops. No chest pain. Lungs: Clear auscultation bilaterally. No shortness of breath. Abdomen: Bowel sounds 4, nontender, nondistended, no hepatosplenomegaly. No rebound tenderness. No loss of bladder or bowel control. Inspection: No obvious deformity, scoleosis, kyphosis, loss of lordotic curve.  Palpation: Non-tender over spinous processes and paraspinal muscles.  AROM: - Knee: flexion and extension normal  and pain free bilaterally. - Ankle: Dorsiflexion, plantarflexion, inversion, eversion normal and pain free.  Dermatomes: Lower extremity sensation to light touch abnormal dysethesias in left L4 dermatome pattern.  Myotomes: - Hip Flexion: Left 5/5, Right 5/5 - Knee Extension: Left 5/5, Right 5/5 - Knee Flexion: Left 5/5, Right 5/5 - Ankle Dorsiflextion: Left 4+/5, Right 5/5 - EHL: Left: 4+/5, Right 5/5 - Ankle Plantarflexion: Left 5/5, Right 5/5  Reflexes: - Patella: Left1+, Right 1+ - Achilles: Left1+, Right 1+ - Babinski: Left Ngative, Right Negative - Clonus: Negative  PV: Extremities warm and well profused. Posterior and dorsalis pedis pulse 2+ bilaterally,   X-Ray impression: No significant scoliosis. There is a slip of L2 on L3 (grade 1). Degenerative changes throughout the lumbar spine. No obvious compression fracture.  MRI impression: MRI of lumbar spine dated 01/22/2019 is significant for left foraminal disc herniation hinging upon the left L4 nerve root.  Assessment:   Stuart Flores is a pleasant Relatively healthy 85 year old male who was in his normal state of health until recently when he began experiencing low back pain and radicular left leg pain After lifting an AC unit.  On physical exam, he has pain and dysesthesias in the left L4 dermatome pattern as well as weakness in the left EHL and anterior tib. Imaging studies left foraminal disc herniation impinging upon the left L4 nerve root. He would like to move forward with surgical intervention.  Plan:   Left far lateral discectomy at L4-5  Risks and benefits of surgery were discussed with the patient. These include: Infection, bleeding, death, stroke, paralysis, ongoing or worse pain, need for additional surgery, leak of spinal fluid, adjacent segment degeneration requiring additional surgery, post-operative hematoma formation that can result in neurological compromise and the need for urgent/emergent re-operation. Loss in bowel and bladder control. Injury to major vessels that could result in the need for urgent abdominal surgery to stop bleeding. Risk of deep venous thrombosis (DVT) and the need for additional treatment. Recurrent disc herniation resulting in the need for revision surgery, which could include fusion surgery (utilizing instrumentation such as pedicle screws and intervertebral cages). Additional risk: If instrumentation is used there is a risk of migration, or breakage of that hardware that could require additional surgery.  We have also discussed the goals of surgery to include: Goals of surgery: Reduction in pain, and improvement in quality of life.  We have also discussed the post-operative recovery period to include: bathing/showering restrictions, wound healing, activity (and driving) restrictions, medications/pain mangement.  We have also discussed post-operative redflags to include: signs and symptoms of postoperative infection, DVT/PE.  I have reviewed the patient's medication list, he is on Eliquis which she will hold 3 days prior to surgery and restart 2 days after surgery. The patient did express understanding of this. I also advised him to avoid any anti-inflammatory medications (NSAIDs) 1  week prior to surgery as well as 2 weeks after.  We have obtained preoperative clearance from the patient's cardiologist who states that he is a low risk for the procedure from a cardiology standpoint. We are yet to obtain clearance from his primary care provider. I have advised the patient to reach out to his primary care provider for this clearance as well as Corliss Marcus, our surgical scheduler, to work on obtaining this clearance.  Patient is scheduled this afternoon with physical therapy for his LSO brace fitting.  Plan is to move forward with surgery as scheduled pending PCP clearance and preoperative labs from [facility]  [facility].  Follow-up: 2 weeks postoperatively

## 2019-02-19 NOTE — H&P (Deleted)
  The note originally documented on this encounter has been moved the the encounter in which it belongs.  

## 2019-02-22 ENCOUNTER — Encounter: Payer: Self-pay | Admitting: Cardiology

## 2019-03-02 NOTE — Pre-Procedure Instructions (Signed)
[id number] [place],  - [address] AT [facility] [address] [place] [address] Phone: [phone] Fax: [phone]      Your procedure is scheduled on 03-07-19  Report to [facility] Main Entrance "A" at 0630 A.M., and check in at the Admitting office.  Call this number if you have problems the morning of surgery:  [phone]  Call [phone] if you have any questions prior to your surgery date Monday-Friday 8am-4pm    Remember:  Do not eat or drink after midnight the night before your surgery  Take these medicines the morning of surgery with A SIP OF WATER : atorvastatin (LIPITOR)  metoprolol succinate (TOPROL-XL) nitroGLYCERIN (NITROSTAT)as needed traMADol (ULTRAM)as needed  Follow your surgeon's instructions on when to stop ELIQUIS.  If no instructions were given by your surgeon then you will need to call the office to get those instructions.    7 days prior to surgery STOP taking any Aspirin (unless otherwise instructed by your surgeon), Aleve, Naproxen, Ibuprofen, Motrin, Advil, Goody's, BC's, all herbal medications, fish oil, and all vitamins.    The Morning of Surgery  Do not wear jewelry, make-up or nail polish.  Do not wear lotions, powders, or perfumes/colognes, or deodorant    Men may shave face and neck.  Do not bring valuables to the hospital.  [facility] is not responsible for any belongings or valuables.  If you are a smoker, DO NOT Smoke 24 hours prior to surgery  If you wear a CPAP at night please bring your mask, tubing, and machine the morning of surgery   Remember that you must have someone to transport you home after your surgery, and remain with you for 24 hours if you are discharged the same day.   Please bring cases for contacts, glasses, hearing aids, dentures or bridgework because it cannot be worn into surgery.    Leave your suitcase in the car.  After surgery it may be  brought to your room.  For patients admitted to the hospital, discharge time will be determined by your treatment team.  Patients discharged the day of surgery will not be allowed to drive home.    Special instructions:   [place]- Preparing For Surgery  Before surgery, you can play an important role. Because skin is not sterile, your skin needs to be as free of germs as possible. You can reduce the number of germs on your skin by washing with CHG (chlorahexidine gluconate) Soap before surgery.  CHG is an antiseptic cleaner which kills germs and bonds with the skin to continue killing germs even after washing.    Oral Hygiene is also important to reduce your risk of infection.  Remember - BRUSH YOUR TEETH THE MORNING OF SURGERY WITH YOUR REGULAR TOOTHPASTE  Please do not use if you have an allergy to CHG or antibacterial soaps. If your skin becomes reddened/irritated stop using the CHG.  Do not shave (including legs and underarms) for at least 48 hours prior to first CHG shower. It is OK to shave your face.  Please follow these instructions carefully.   1. Shower the NIGHT BEFORE SURGERY and the MORNING OF SURGERY with CHG Soap.   2. If you chose to wash your hair, wash your hair first as usual with your normal shampoo.  3. After you shampoo, rinse your hair and body thoroughly to remove the shampoo.  4. Use CHG as you would any other liquid  soap. You can apply CHG directly to the skin and wash gently with a scrungie or a clean washcloth.   5. Apply the CHG Soap to your body ONLY FROM THE NECK DOWN.  Do not use on open wounds or open sores. Avoid contact with your eyes, ears, mouth and genitals (private parts). Wash Face and genitals (private parts)  with your normal soap.   6. Wash thoroughly, paying special attention to the area where your surgery will be performed.  7. Thoroughly rinse your body with warm water from the neck down.  8. DO NOT shower/wash with your normal soap  after using and rinsing off the CHG Soap.  9. Pat yourself dry with a CLEAN TOWEL.  10. Wear CLEAN PAJAMAS to bed the night before surgery, wear comfortable clothes the morning of surgery  11. Place CLEAN SHEETS on your bed the night of your first shower and DO NOT SLEEP WITH PETS.  Day of Surgery:  Please shower the morning of surgery with the CHG soap Do not apply any deodorants/lotions. Please wear clean clothes to the hospital/surgery center.   Remember to brush your teeth WITH YOUR REGULAR TOOTHPASTE.   Please read over the following fact sheets that you were given.

## 2019-03-05 ENCOUNTER — Encounter (HOSPITAL_COMMUNITY)
Admission: RE | Admit: 2019-03-05 | Discharge: 2019-03-05 | Disposition: A | Discharge status: Home / Self Care | Payer: Medicare Other | Source: Ambulatory Visit | Attending: Orthopedic Surgery | Admitting: Orthopedic Surgery

## 2019-03-05 ENCOUNTER — Other Ambulatory Visit: Payer: Self-pay

## 2019-03-05 ENCOUNTER — Encounter (HOSPITAL_COMMUNITY): Payer: Self-pay

## 2019-03-05 ENCOUNTER — Other Ambulatory Visit (HOSPITAL_COMMUNITY)
Admission: RE | Admit: 2019-03-05 | Discharge: 2019-03-05 | Disposition: A | Discharge status: Home / Self Care | Payer: Medicare Other | Source: Ambulatory Visit | Attending: Orthopedic Surgery | Admitting: Orthopedic Surgery

## 2019-03-05 DIAGNOSIS — Z96653 Presence of artificial knee joint, bilateral: Secondary | ICD-10-CM | POA: Insufficient documentation

## 2019-03-05 DIAGNOSIS — M5126 Other intervertebral disc displacement, lumbar region: Secondary | ICD-10-CM | POA: Insufficient documentation

## 2019-03-05 DIAGNOSIS — R001 Bradycardia, unspecified: Secondary | ICD-10-CM | POA: Diagnosis not present

## 2019-03-05 DIAGNOSIS — Z01818 Encounter for other preprocedural examination: Secondary | ICD-10-CM | POA: Insufficient documentation

## 2019-03-05 DIAGNOSIS — I2510 Atherosclerotic heart disease of native coronary artery without angina pectoris: Secondary | ICD-10-CM | POA: Diagnosis not present

## 2019-03-05 DIAGNOSIS — I480 Paroxysmal atrial fibrillation: Secondary | ICD-10-CM | POA: Insufficient documentation

## 2019-03-05 DIAGNOSIS — H409 Unspecified glaucoma: Secondary | ICD-10-CM | POA: Diagnosis not present

## 2019-03-05 DIAGNOSIS — Z87891 Personal history of nicotine dependence: Secondary | ICD-10-CM | POA: Diagnosis not present

## 2019-03-05 DIAGNOSIS — Z955 Presence of coronary angioplasty implant and graft: Secondary | ICD-10-CM | POA: Diagnosis not present

## 2019-03-05 LAB — URINALYSIS, ROUTINE W REFLEX MICROSCOPIC
Bilirubin Urine: NEGATIVE
Glucose, UA: NEGATIVE mg/dL
Hgb urine dipstick: NEGATIVE
Ketones, ur: NEGATIVE mg/dL
Leukocytes,Ua: NEGATIVE
Nitrite: NEGATIVE
Protein, ur: NEGATIVE mg/dL
Specific Gravity, Urine: 1.023 (ref 1.005–1.030)
pH: 5 (ref 5.0–8.0)

## 2019-03-05 LAB — BASIC METABOLIC PANEL
Anion gap: 6 (ref 5–15)
BUN: 22 mg/dL (ref 8–23)
CO2: 28 mmol/L (ref 22–32)
Calcium: 8.9 mg/dL (ref 8.9–10.3)
Chloride: 106 mmol/L (ref 98–111)
Creatinine, Ser: 0.86 mg/dL (ref 0.61–1.24)
GFR calc Af Amer: >60 mL/min (ref >60)
GFR calc non Af Amer: >60 mL/min (ref >60)
Glucose, Bld: 81 mg/dL (ref 70–99)
Potassium: 4.3 mmol/L (ref 3.5–5.1)
Sodium: 140 mmol/L (ref 135–145)

## 2019-03-05 LAB — SURGICAL PCR SCREEN
MRSA, PCR: NEGATIVE
Staphylococcus aureus: NEGATIVE

## 2019-03-05 LAB — PROTIME-INR
INR: 1.1 (ref 0.8–1.2)
Prothrombin Time: 14.3 seconds (ref 11.4–15.2)

## 2019-03-05 LAB — APTT: aPTT: 33 seconds (ref 24–36)

## 2019-03-05 LAB — CBC
HCT: 38.9 % — ABNORMAL LOW (ref 39.0–52.0)
Hemoglobin: 12.4 g/dL — ABNORMAL LOW (ref 13.0–17.0)
MCH: 30.5 pg (ref 26.0–34.0)
MCHC: 31.9 g/dL (ref 30.0–36.0)
MCV: 95.6 fL (ref 80.0–100.0)
Platelets: 236 10*3/uL (ref 150–400)
RBC: 4.07 MIL/uL — ABNORMAL LOW (ref 4.22–5.81)
RDW: 13.7 % (ref 11.5–15.5)
WBC: 7.6 10*3/uL (ref 4.0–10.5)
nRBC: 0 % (ref 0.0–0.2)

## 2019-03-05 LAB — SARS CORONAVIRUS 2 (TAT 6-24 HRS): SARS Coronavirus 2: NEGATIVE

## 2019-03-05 NOTE — Progress Notes (Addendum)
PCP - Dr. Alfonso Patten. Ava  Cardiologist - Dr. Christen Butter  Chest x-ray - 03/30/18 (E)  EKG - 12/04/2018 (E)  Stress Test - Denies  ECHO - Denies  Cardiac Cath - 03/01/15 (E)  AICD-na PM-na LOOP-na  Sleep Study - Denies CPAP - None  LABS- 03/05/2019: CBC, CMP, PT, PTT, UA, PCR, COVID  ASA- Denies Eliquis- LD- 11/28  ERAS-No  HA1C-Denies Fasting Blood Sugar -  Checks Blood Sugar _____ times a day  Anesthesia- Yes- surgical order  Pt denies having chest pain, sob, or fever at this time. All instructions explained to the pt, with a verbal understanding of the material. Pt agrees to go over the instructions while at home for a better understanding. Pt also instructed to self quarantine after being tested for COVID-19. The opportunity to ask questions was provided.   Coronavirus Screening  Have you experienced the following symptoms:  Cough yes/no: No Fever (>100.82F)  yes/no: No Runny nose yes/no: No Sore throat yes/no: No Difficulty breathing/shortness of breath  yes/no: No  Have you or a family member traveled in the last 14 days and where? yes/no: No   If the patient indicates "YES" to the above questions, their PAT will be rescheduled to limit the exposure to others and, the surgeon will be notified. THE PATIENT WILL NEED TO BE ASYMPTOMATIC FOR 14 DAYS.   If the patient is not experiencing any of these symptoms, the PAT nurse will instruct them to NOT bring anyone with them to their appointment since they may have these symptoms or traveled as well.   Please remind your patients and families that hospital visitation restrictions are in effect and the importance of the restrictions.

## 2019-03-06 NOTE — Progress Notes (Signed)
Anesthesia Chart Review:  Case: [id number] Date/Time: 03/07/19 0815   Procedure: Left far lateral disectomy L4-5 (Left ) - 2 hrs   Anesthesia type: General   Pre-op diagnosis: Left foraminal herniated disc with left L4 radiculopathy   Location: [facility] OR ROOM 04 / [place] OR   Surgeon: Melina Schools, MD      DISCUSSION: Patient is an 85 year old male scheduled for the above procedure.  History includes former smoker, CAD (DES mCFX, mLAD 03/04/15), bradycardia, PAF (12/2016 in setting of PNA), skin cancer (SCC), glaucoma, hearing aids, BPH, TKA (right 2017, left 03/2018). Has daily wine or beer (~ 9 standard drinks/week).   Cardiologist Dr. Einar Gip felt patient was "low risk, from a cardiac standpoint" for upcoming surgery.  See Letters tab. Last Eliquis 03/03/2019.  03/05/2019 COVID-19 test negative.  If no acute changes then I would anticipate that he could proceed as planned.   VS: BP 104/61   Pulse 65   Temp 36.4 C (Oral)   Resp 20   Ht 6' (1.829 m)   SpO2 100%   BMI 22.50 kg/m     PROVIDERS: [name], MD is PCP  - Adrian Prows, MD is cardiologist. Last visit 8/31/200. Sarina Ill, MD is neurologist. Last visit 05/31/2018 for memory loss and also for hospital follow-up for metabolic encephalopathy, likely due to hyponatremia from Desmopressin use. Neuropsychology evaluation ordered to further evaluate memory.   LABS: Labs reviewed: Acceptable for surgery. (all labs ordered are listed, but only abnormal results are displayed)  Labs Reviewed  CBC - Abnormal; Notable for the following components:      Result Value   RBC 4.07 (*)    Hemoglobin 12.4 (*)    HCT 38.9 (*)    All other components within normal limits  SURGICAL PCR SCREEN  APTT  BASIC METABOLIC PANEL  PROTIME-INR  URINALYSIS, ROUTINE W REFLEX MICROSCOPIC    IMAGES: CXR 03/30/2018: FINDINGS: Heart is normal size. Tortuous aorta. Lungs clear. No effusions or pneumothorax. No acute bony  abnormality. IMPRESSION: No active cardiopulmonary disease.   EKG: EKG 12/04/2018: Marked sinus bradycardia at rate of 47 bpm otherwise normal EKG.   CV: Echocardiogram 01/20/2017 [facility] CV): Left ventricle cavity is normal in size. Mild concentric hypertrophy of the left ventricle. Normal global wall motion. Calculated EF 55%. Mild (Grade I) mitral regurgitation. Mild tricuspid regurgitation. Mild pulmonic regurgitation. No evidence of pulmonary hypertension. Paroxysmal atrial fibrillation with RVR (I48.0)   Exercise myoview stress 12/27/2016 [facility] CV): 1. The patient performed treadmill exercise using a Bruce protocol, completing 5:00 minutes, achieving 6.89 METS, normal hemodynamic response. The patient did not develop symptoms other than fatigue during the procedure. 2. The stress electrocardiogram showed sinus tachycardia, normal stress conduction, no stress arrhythmias and normal stress repolarization. No ischemic changes. 3. The overall quality of the study is excellent. There is no evidence of abnormal lung activity. Stress and rest SPECT images demonstrate homogeneous tracer distribution throughout the myocardium. Gated SPECT imaging reveals normal myocardial thickening and wall motion. The left ventricular ejection fraction was normal (67%). This is a low risk study.   Cardiac cath/PCI 03/04/2015: 1. Normal LV systolic function, 96-29%. 2. Small nondominant RCA. Large dominant circumflex coronary artery. Circumflex 99%/subtotally occluded. 3. Diffusely diseased LAD, proximal 40-50%, mid diffuse moderate disease, mid LAD with a 80% stenosis. 4. Diagonal 1 with a proximal 70% stenosis, small to moderate-sized vessel. 5. Successful PTCA and stenting of the mid circumflex coronary artery with implantation of a 4.0 x 30 mm  resolute integrity DES, stenosis reduced from 99% to 0%, TIMI flow improved from 2-3 6. Successful PTCA and stenting of the mid LAD with implantation of a  2.75 x 22 mm resolute DES, 80% reduced to 0%.   Past Medical History:  Diagnosis Date  . Arthritis    ostearthritis- back, knee  . BPH with obstruction/lower urinary tract symptoms    urologist-  dr r. evans @[facility]   . Bradycardia   . Coronary artery disease    cardiologist-- dr  . History of glaucoma    per pt no issues since cataract extraction's bilaterally 2015  . History of squamous cell carcinoma excision    face x2  . PAF (paroxysmal atrial fibrillation) [facility])    cardiologist-- dr [facility]  . S/P drug eluting coronary stent placement 03/04/2015   DES x1 to mid CFx and DES x1 to mid LAD  . Wears glasses   . Wears hearing aid in both ears     Past Surgical History:  Procedure Laterality Date  . CARDIAC CATHETERIZATION N/A 03/04/2015   Procedure: Left Heart Cath and Coronary Angiography;  Surgeon: 03/06/2015, MD;  Location: [facility] INVASIVE CV LAB;  Service: Cardiovascular;  Laterality: N/A;  . CARDIAC CATHETERIZATION  03/04/2015   Procedure: Coronary Stent Intervention;  Surgeon: 03/06/2015, MD;  Location: [facility] INVASIVE CV LAB;  Service: Cardiovascular;;  DES to mCFx and mLAD  . CARDIOVASCULAR STRESS TEST  12-27-2016   dr 12-29-2016   Low risk nuclear study w/ no ischemia/  normal LV function and wall function, ef 67%  . CATARACT EXTRACTION W/ INTRAOCULAR LENS  IMPLANT, BILATERAL Bilateral 2015  . EYE SURGERY     Bilateral cataracts x2  . HAND SURGERY Left early 2000s   "thumb surgery for Tendon pair"  . KNEE ARTHROSCOPY Bilateral   . STERIOD INJECTION Left 11/24/2017   Procedure: LEFT KNEE CORTISONE INJECTION;  Surgeon: 11/26/2017, MD;  Location: [facility] ORS;  Service: Orthopedics;  Laterality: Left;  . TONSILLECTOMY  child  . TOTAL KNEE ARTHROPLASTY Right 10/27/2015   Procedure: TOTAL RIGHT KNEE ARTHROPLASTY;  Surgeon: 10/29/2015, MD;  Location: [facility] ORS;  Service: Orthopedics;  Laterality: Right;  . TOTAL KNEE ARTHROPLASTY Left 03/09/2018   Procedure: TOTAL KNEE ARTHROPLASTY;   Surgeon: 14/08/2017, MD;  Location: [facility] ORS;  Service: Orthopedics;  Laterality: Left;  70 mins with block  . TOTAL KNEE REVISION WITH SCAR DEBRIDEMENT/PATELLA REVISION WITH POLY EXCHANGE Right 11/24/2017   Procedure: RIGHT KNEE OPEN ARTHROTOMY WITH SCAR DEBRIDEMENT AND POSSIBLE POLY EXCHANGE;  Surgeon: 11/26/2017, MD;  Location: [facility] ORS;  Service: Orthopedics;  Laterality: Right;  90 mins  . TRANSTHORACIC ECHOCARDIOGRAM  01-20-2017   dr 01-22-2017   mild concentric LVH, ef 55%/  trace AR/  mild MR, TR and  PR    MEDICATIONS: . apixaban (ELIQUIS) 5 MG TABS tablet  . atorvastatin (LIPITOR) 40 MG tablet  . docusate sodium (COLACE) 100 MG capsule  . ferrous sulfate (FERROUSUL) 325 (65 FE) MG tablet  . metoprolol succinate (TOPROL-XL) 25 MG 24 hr tablet  . Multiple Vitamin (MULTIVITAMIN WITH MINERALS) TABS tablet  . nitroGLYCERIN (NITROSTAT) 0.4 MG SL tablet  . Probiotic Product (PROBIOTIC DAILY PO)  . traMADol (ULTRAM) 50 MG tablet   No current facility-administered medications for this encounter.     Jacinto Halim, PA-C Surgical Short Stay/Anesthesiology [facility] Phone [phone] [facility] Phone [phone] 03/06/2019 9:28 AM

## 2019-03-06 NOTE — Anesthesia Preprocedure Evaluation (Addendum)
Anesthesia Evaluation  Patient identified by MRN, date of birth, ID band Patient awake    Reviewed: Allergy & Precautions, NPO status , Patient's Chart, lab work & pertinent test results  Airway Mallampati: II  TM Distance: >3 FB     Dental   Pulmonary former smoker,    breath sounds clear to auscultation       Cardiovascular + angina + CAD   Rhythm:Regular Rate:Normal     Neuro/Psych    GI/Hepatic negative GI ROS, Neg liver ROS,   Endo/Other    Renal/GU negative Renal ROS     Musculoskeletal   Abdominal   Peds  Hematology   Anesthesia Other Findings   Reproductive/Obstetrics                            Anesthesia Physical Anesthesia Plan  ASA: III  Anesthesia Plan: General   Post-op Pain Management:    Induction: Intravenous  PONV Risk Score and Plan: 2 and Ondansetron and Dexamethasone  Airway Management Planned: Oral ETT  Additional Equipment:   Intra-op Plan:   Post-operative Plan: Extubation in OR  Informed Consent: I have reviewed the patients History and Physical, chart, labs and discussed the procedure including the risks, benefits and alternatives for the proposed anesthesia with the patient or authorized representative who has indicated his/her understanding and acceptance.     Dental advisory given  Plan Discussed with: CRNA and Anesthesiologist  Anesthesia Plan Comments: (PAT note written 03/06/2019 by Myra Gianotti, PA-C. )       Anesthesia Quick Evaluation

## 2019-03-07 ENCOUNTER — Encounter (HOSPITAL_COMMUNITY)
Admission: RE | Disposition: A | Discharge status: Home / Self Care | Payer: Self-pay | Source: Home / Self Care | Attending: Orthopedic Surgery

## 2019-03-07 ENCOUNTER — Encounter (HOSPITAL_COMMUNITY): Payer: Self-pay | Admitting: *Deleted

## 2019-03-07 ENCOUNTER — Ambulatory Visit (HOSPITAL_COMMUNITY): Payer: Medicare Other | Admitting: Anesthesiology

## 2019-03-07 ENCOUNTER — Ambulatory Visit (HOSPITAL_COMMUNITY): Payer: Medicare Other

## 2019-03-07 ENCOUNTER — Ambulatory Visit (HOSPITAL_COMMUNITY): Payer: Medicare Other | Admitting: Vascular Surgery

## 2019-03-07 ENCOUNTER — Other Ambulatory Visit: Payer: Self-pay

## 2019-03-07 ENCOUNTER — Observation Stay (HOSPITAL_COMMUNITY)
Admission: RE | Admit: 2019-03-07 | Discharge: 2019-03-07 | Disposition: A | Discharge status: Home / Self Care | Payer: Medicare Other | Attending: Orthopedic Surgery | Admitting: Orthopedic Surgery

## 2019-03-07 DIAGNOSIS — I480 Paroxysmal atrial fibrillation: Secondary | ICD-10-CM | POA: Insufficient documentation

## 2019-03-07 DIAGNOSIS — Z96653 Presence of artificial knee joint, bilateral: Secondary | ICD-10-CM | POA: Insufficient documentation

## 2019-03-07 DIAGNOSIS — Z7901 Long term (current) use of anticoagulants: Secondary | ICD-10-CM | POA: Insufficient documentation

## 2019-03-07 DIAGNOSIS — M8949 Other hypertrophic osteoarthropathy, multiple sites: Secondary | ICD-10-CM | POA: Insufficient documentation

## 2019-03-07 DIAGNOSIS — I2510 Atherosclerotic heart disease of native coronary artery without angina pectoris: Secondary | ICD-10-CM | POA: Diagnosis not present

## 2019-03-07 DIAGNOSIS — M5116 Intervertebral disc disorders with radiculopathy, lumbar region: Secondary | ICD-10-CM | POA: Diagnosis not present

## 2019-03-07 DIAGNOSIS — Z87891 Personal history of nicotine dependence: Secondary | ICD-10-CM | POA: Insufficient documentation

## 2019-03-07 DIAGNOSIS — Z79899 Other long term (current) drug therapy: Secondary | ICD-10-CM | POA: Insufficient documentation

## 2019-03-07 DIAGNOSIS — Z419 Encounter for procedure for purposes other than remedying health state, unspecified: Secondary | ICD-10-CM

## 2019-03-07 DIAGNOSIS — Z955 Presence of coronary angioplasty implant and graft: Secondary | ICD-10-CM | POA: Diagnosis not present

## 2019-03-07 DIAGNOSIS — M5416 Radiculopathy, lumbar region: Secondary | ICD-10-CM | POA: Diagnosis present

## 2019-03-07 HISTORY — PX: LUMBAR LAMINECTOMY/DECOMPRESSION MICRODISCECTOMY: SHX5026

## 2019-03-07 SURGERY — LUMBAR LAMINECTOMY/DECOMPRESSION MICRODISCECTOMY 1 LEVEL
Anesthesia: General | Laterality: Left

## 2019-03-07 MED ORDER — SUGAMMADEX SODIUM 200 MG/2ML IV SOLN
INTRAVENOUS | Status: DC | PRN
  Administered 2019-03-07: 150 mg via INTRAVENOUS

## 2019-03-07 MED ORDER — PHENOL 1.4 % MT LIQD: 1.0000 | OROMUCOSAL | Status: DC | PRN

## 2019-03-07 MED ORDER — FENTANYL CITRATE (PF) 100 MCG/2ML IJ SOLN: 25.0000 ug | INTRAMUSCULAR | Status: DC | PRN

## 2019-03-07 MED ORDER — HEMOSTATIC AGENTS (NO CHARGE) OPTIME
TOPICAL | Status: DC | PRN
  Administered 2019-03-07 (×2): 1 via TOPICAL

## 2019-03-07 MED ORDER — CEFAZOLIN SODIUM-DEXTROSE 2-4 GM/100ML-% IV SOLN
2.0000 g | INTRAVENOUS | Status: AC
  Administered 2019-03-07: 2 g via INTRAVENOUS
  Filled 2019-03-07: qty 100

## 2019-03-07 MED ORDER — METOPROLOL SUCCINATE 12.5 MG HALF TABLET: 12.5000 mg | ORAL_TABLET | Freq: Every day | ORAL | Status: DC

## 2019-03-07 MED ORDER — CEFAZOLIN SODIUM-DEXTROSE 2-4 GM/100ML-% IV SOLN: 2.0000 g | Freq: Three times a day (TID) | INTRAVENOUS | Status: DC

## 2019-03-07 MED ORDER — TRAMADOL HCL 50 MG PO TABS: 50.0000 mg | ORAL_TABLET | Freq: Three times a day (TID) | ORAL | Status: DC | PRN

## 2019-03-07 MED ORDER — PHENYLEPHRINE 40 MCG/ML (10ML) SYRINGE FOR IV PUSH (FOR BLOOD PRESSURE SUPPORT)
PREFILLED_SYRINGE | INTRAVENOUS | Status: AC
  Filled 2019-03-07: qty 10

## 2019-03-07 MED ORDER — ROCURONIUM BROMIDE 50 MG/5ML IV SOSY
PREFILLED_SYRINGE | INTRAVENOUS | Status: DC | PRN
  Administered 2019-03-07: 50 mg via INTRAVENOUS

## 2019-03-07 MED ORDER — METHYLPREDNISOLONE ACETATE 40 MG/ML INJ SUSP (RADIOLOG
INTRAMUSCULAR | Status: DC | PRN
  Administered 2019-03-07: 120 mg

## 2019-03-07 MED ORDER — BUPIVACAINE-EPINEPHRINE 0.5% -1:200000 IJ SOLN
INTRAMUSCULAR | Status: AC
  Filled 2019-03-07: qty 1

## 2019-03-07 MED ORDER — LACTATED RINGERS IV SOLN
INTRAVENOUS | Status: DC | PRN
  Administered 2019-03-07: 08:00:00 via INTRAVENOUS

## 2019-03-07 MED ORDER — ONDANSETRON HCL 4 MG/2ML IJ SOLN
INTRAMUSCULAR | Status: DC | PRN
  Administered 2019-03-07: 4 mg via INTRAVENOUS

## 2019-03-07 MED ORDER — METHOCARBAMOL 500 MG PO TABS: 500.0000 mg | ORAL_TABLET | Freq: Four times a day (QID) | ORAL | Status: DC | PRN

## 2019-03-07 MED ORDER — SODIUM CHLORIDE 0.9% FLUSH: 3.0000 mL | Freq: Two times a day (BID) | INTRAVENOUS | Status: DC

## 2019-03-07 MED ORDER — TRAMADOL HCL 50 MG PO TABS: 50.0000 mg | ORAL_TABLET | Freq: Four times a day (QID) | ORAL | 0 refills | Status: AC | PRN

## 2019-03-07 MED ORDER — ACETAMINOPHEN 10 MG/ML IV SOLN
INTRAVENOUS | Status: AC
  Filled 2019-03-07: qty 100

## 2019-03-07 MED ORDER — PHENYLEPHRINE HCL (PRESSORS) 10 MG/ML IV SOLN
INTRAVENOUS | Status: DC | PRN
  Administered 2019-03-07: 120 ug via INTRAVENOUS
  Administered 2019-03-07: 80 ug via INTRAVENOUS

## 2019-03-07 MED ORDER — GLYCOPYRROLATE PF 0.2 MG/ML IJ SOSY
PREFILLED_SYRINGE | INTRAMUSCULAR | Status: DC | PRN
  Administered 2019-03-07: .2 mg via INTRAVENOUS

## 2019-03-07 MED ORDER — EPHEDRINE SULFATE 50 MG/ML IJ SOLN
INTRAMUSCULAR | Status: DC | PRN
  Administered 2019-03-07: 20 mg via INTRAVENOUS
  Administered 2019-03-07 (×2): 10 mg via INTRAVENOUS

## 2019-03-07 MED ORDER — ONDANSETRON HCL 4 MG/2ML IJ SOLN: 4.0000 mg | Freq: Four times a day (QID) | INTRAMUSCULAR | Status: DC | PRN

## 2019-03-07 MED ORDER — MENTHOL 3 MG MT LOZG: 1.0000 | LOZENGE | OROMUCOSAL | Status: DC | PRN

## 2019-03-07 MED ORDER — PROPOFOL 10 MG/ML IV BOLUS
INTRAVENOUS | Status: AC
  Filled 2019-03-07: qty 40

## 2019-03-07 MED ORDER — ACETAMINOPHEN 10 MG/ML IV SOLN
INTRAVENOUS | Status: DC | PRN
  Administered 2019-03-07: 1000 mg via INTRAVENOUS

## 2019-03-07 MED ORDER — ACETAMINOPHEN 325 MG PO TABS: 650.0000 mg | ORAL_TABLET | ORAL | Status: DC | PRN

## 2019-03-07 MED ORDER — BUPIVACAINE-EPINEPHRINE 0.25% -1:200000 IJ SOLN
INTRAMUSCULAR | Status: DC | PRN
  Administered 2019-03-07: 20 mL

## 2019-03-07 MED ORDER — SODIUM CHLORIDE 0.9 % IV SOLN: 250.0000 mL | INTRAVENOUS | Status: DC

## 2019-03-07 MED ORDER — THROMBIN 20000 UNITS EX SOLR
CUTANEOUS | Status: DC | PRN
  Administered 2019-03-07: 10:00:00 via TOPICAL

## 2019-03-07 MED ORDER — METHOCARBAMOL 1000 MG/10ML IJ SOLN
500.0000 mg | Freq: Four times a day (QID) | INTRAVENOUS | Status: DC | PRN
  Filled 2019-03-07: qty 5

## 2019-03-07 MED ORDER — METHYLPREDNISOLONE ACETATE 40 MG/ML IJ SUSP
INTRAMUSCULAR | Status: AC
  Filled 2019-03-07: qty 1

## 2019-03-07 MED ORDER — DEXAMETHASONE SODIUM PHOSPHATE 10 MG/ML IJ SOLN
INTRAMUSCULAR | Status: AC
  Filled 2019-03-07: qty 1

## 2019-03-07 MED ORDER — METHOCARBAMOL 500 MG PO TABS: 500.0000 mg | ORAL_TABLET | Freq: Three times a day (TID) | ORAL | 0 refills | Status: AC | PRN

## 2019-03-07 MED ORDER — ROCURONIUM BROMIDE 10 MG/ML (PF) SYRINGE
PREFILLED_SYRINGE | INTRAVENOUS | Status: AC
  Filled 2019-03-07: qty 10

## 2019-03-07 MED ORDER — ONDANSETRON HCL 4 MG PO TABS: 4.0000 mg | ORAL_TABLET | Freq: Four times a day (QID) | ORAL | Status: DC | PRN

## 2019-03-07 MED ORDER — PROPOFOL 10 MG/ML IV BOLUS
INTRAVENOUS | Status: DC | PRN
  Administered 2019-03-07: 150 mg via INTRAVENOUS

## 2019-03-07 MED ORDER — ATORVASTATIN CALCIUM 40 MG PO TABS: 40.0000 mg | ORAL_TABLET | Freq: Every day | ORAL | Status: DC

## 2019-03-07 MED ORDER — ALBUMIN HUMAN 5 % IV SOLN
INTRAVENOUS | Status: DC | PRN
  Administered 2019-03-07: 10:00:00 via INTRAVENOUS

## 2019-03-07 MED ORDER — LACTATED RINGERS IV SOLN: INTRAVENOUS | Status: DC

## 2019-03-07 MED ORDER — THROMBIN (RECOMBINANT) 20000 UNITS EX SOLR
CUTANEOUS | Status: AC
  Filled 2019-03-07: qty 20000

## 2019-03-07 MED ORDER — PHENYLEPHRINE HCL-NACL 10-0.9 MG/250ML-% IV SOLN
INTRAVENOUS | Status: DC | PRN
  Administered 2019-03-07: 25 ug/min via INTRAVENOUS

## 2019-03-07 MED ORDER — LIDOCAINE 2% (20 MG/ML) 5 ML SYRINGE
INTRAMUSCULAR | Status: DC | PRN
  Administered 2019-03-07: 60 mg via INTRAVENOUS
  Administered 2019-03-07: 40 mg via INTRAVENOUS

## 2019-03-07 MED ORDER — SODIUM CHLORIDE 0.9% FLUSH: 3.0000 mL | INTRAVENOUS | Status: DC | PRN

## 2019-03-07 MED ORDER — 0.9 % SODIUM CHLORIDE (POUR BTL) OPTIME
TOPICAL | Status: DC | PRN
  Administered 2019-03-07: 1000 mL

## 2019-03-07 MED ORDER — ACETAMINOPHEN 650 MG RE SUPP: 650.0000 mg | RECTAL | Status: DC | PRN

## 2019-03-07 MED ORDER — LIDOCAINE 2% (20 MG/ML) 5 ML SYRINGE
INTRAMUSCULAR | Status: AC
  Filled 2019-03-07: qty 5

## 2019-03-07 MED ORDER — FENTANYL CITRATE (PF) 250 MCG/5ML IJ SOLN
INTRAMUSCULAR | Status: AC
  Filled 2019-03-07: qty 5

## 2019-03-07 MED ORDER — ONDANSETRON HCL 4 MG PO TABS: 4.0000 mg | ORAL_TABLET | Freq: Three times a day (TID) | ORAL | 0 refills | Status: AC | PRN

## 2019-03-07 MED ORDER — ONDANSETRON HCL 4 MG/2ML IJ SOLN
INTRAMUSCULAR | Status: AC
  Filled 2019-03-07: qty 2

## 2019-03-07 MED ORDER — FENTANYL CITRATE (PF) 100 MCG/2ML IJ SOLN
INTRAMUSCULAR | Status: DC | PRN
  Administered 2019-03-07 (×2): 50 ug via INTRAVENOUS

## 2019-03-07 MED ORDER — DEXAMETHASONE SODIUM PHOSPHATE 10 MG/ML IJ SOLN
INTRAMUSCULAR | Status: DC | PRN
  Administered 2019-03-07: 5 mg via INTRAVENOUS

## 2019-03-07 SURGICAL SUPPLY — 64 items
AGENT HMST KT MTR STRL THRMB (HEMOSTASIS) ×1
BNDG GAUZE ELAST 4 BULKY (GAUZE/BANDAGES/DRESSINGS) ×3 IMPLANT
BUR EGG ELITE 4.0 (BURR) IMPLANT
BUR EGG ELITE 4.0MM (BURR)
BUR MATCHSTICK NEURO 3.0 LAGG (BURR) IMPLANT
CANISTER SUCT 3000ML PPV (MISCELLANEOUS) ×3 IMPLANT
CLOSURE STERI-STRIP 1/2X4 (GAUZE/BANDAGES/DRESSINGS) ×1
CLSR STERI-STRIP ANTIMIC 1/2X4 (GAUZE/BANDAGES/DRESSINGS) ×2 IMPLANT
CORD BIPOLAR FORCEPS 12FT (ELECTRODE) ×3 IMPLANT
COVER SURGICAL LIGHT HANDLE (MISCELLANEOUS) ×3 IMPLANT
COVER WAND RF STERILE (DRAPES) ×3 IMPLANT
DRAIN CHANNEL 15F RND FF W/TCR (WOUND CARE) IMPLANT
DRAPE POUCH INSTRU U-SHP 10X18 (DRAPES) ×3 IMPLANT
DRAPE SURG 17X23 STRL (DRAPES) ×3 IMPLANT
DRAPE U-SHAPE 47X51 STRL (DRAPES) ×3 IMPLANT
DRSG OPSITE POSTOP 3X4 (GAUZE/BANDAGES/DRESSINGS) ×3 IMPLANT
DRSG OPSITE POSTOP 4X6 (GAUZE/BANDAGES/DRESSINGS) ×2 IMPLANT
DURAPREP 26ML APPLICATOR (WOUND CARE) ×3 IMPLANT
ELECT BLADE 4.0 EZ CLEAN MEGAD (MISCELLANEOUS)
ELECT CAUTERY BLADE 6.4 (BLADE) ×3 IMPLANT
ELECT PENCIL ROCKER SW 15FT (MISCELLANEOUS) ×3 IMPLANT
ELECT REM PT RETURN 9FT ADLT (ELECTROSURGICAL) ×3
ELECTRODE BLDE 4.0 EZ CLN MEGD (MISCELLANEOUS) IMPLANT
ELECTRODE REM PT RTRN 9FT ADLT (ELECTROSURGICAL) ×1 IMPLANT
EVACUATOR SILICONE 100CC (DRAIN) IMPLANT
GLOVE BIO SURGEON STRL SZ 6.5 (GLOVE) ×2 IMPLANT
GLOVE BIO SURGEONS STRL SZ 6.5 (GLOVE) ×1
GLOVE BIOGEL PI IND STRL 6.5 (GLOVE) ×1 IMPLANT
GLOVE BIOGEL PI IND STRL 8.5 (GLOVE) ×1 IMPLANT
GLOVE BIOGEL PI INDICATOR 6.5 (GLOVE) ×2
GLOVE BIOGEL PI INDICATOR 8.5 (GLOVE) ×2
GLOVE SS BIOGEL STRL SZ 8.5 (GLOVE) ×1 IMPLANT
GLOVE SUPERSENSE BIOGEL SZ 8.5 (GLOVE) ×2
GOWN STRL REUS W/ TWL LRG LVL3 (GOWN DISPOSABLE) ×2 IMPLANT
GOWN STRL REUS W/TWL 2XL LVL3 (GOWN DISPOSABLE) ×3 IMPLANT
GOWN STRL REUS W/TWL LRG LVL3 (GOWN DISPOSABLE) ×6
KIT BASIN OR (CUSTOM PROCEDURE TRAY) ×3 IMPLANT
KIT TURNOVER KIT B (KITS) ×3 IMPLANT
NDL SPNL 18GX3.5 QUINCKE PK (NEEDLE) ×2 IMPLANT
NEEDLE 22X1 1/2 (OR ONLY) (NEEDLE) ×3 IMPLANT
NEEDLE SPNL 18GX3.5 QUINCKE PK (NEEDLE) ×6 IMPLANT
NS IRRIG 1000ML POUR BTL (IV SOLUTION) ×3 IMPLANT
PACK LAMINECTOMY ORTHO (CUSTOM PROCEDURE TRAY) ×3 IMPLANT
PACK UNIVERSAL I (CUSTOM PROCEDURE TRAY) ×3 IMPLANT
PAD ARMBOARD 7.5X6 YLW CONV (MISCELLANEOUS) ×6 IMPLANT
PATTIES SURGICAL .5 X.5 (GAUZE/BANDAGES/DRESSINGS) ×3 IMPLANT
PATTIES SURGICAL .5 X1 (DISPOSABLE) ×3 IMPLANT
SPONGE SURGIFOAM ABS GEL 100 (HEMOSTASIS) ×2 IMPLANT
SURGIFLO W/THROMBIN 8M KIT (HEMOSTASIS) ×2 IMPLANT
SUT BONE WAX W31G (SUTURE) ×3 IMPLANT
SUT MON AB 3-0 SH 27 (SUTURE) ×3
SUT MON AB 3-0 SH27 (SUTURE) ×1 IMPLANT
SUT VIC AB 0 CT1 27 (SUTURE)
SUT VIC AB 0 CT1 27XBRD ANBCTR (SUTURE) IMPLANT
SUT VIC AB 1 CT1 18XCR BRD 8 (SUTURE) ×1 IMPLANT
SUT VIC AB 1 CT1 8-18 (SUTURE) ×3
SUT VIC AB 1 CTX 18 (SUTURE) ×2 IMPLANT
SUT VIC AB 2-0 CT1 18 (SUTURE) ×3 IMPLANT
SYR BULB IRRIGATION 50ML (SYRINGE) ×3 IMPLANT
SYR CONTROL 10ML LL (SYRINGE) ×3 IMPLANT
TOWEL GREEN STERILE (TOWEL DISPOSABLE) ×3 IMPLANT
TOWEL GREEN STERILE FF (TOWEL DISPOSABLE) ×3 IMPLANT
WATER STERILE IRR 1000ML POUR (IV SOLUTION) ×3 IMPLANT
YANKAUER SUCT BULB TIP NO VENT (SUCTIONS) IMPLANT

## 2019-03-07 NOTE — Evaluation (Signed)
Occupational Therapy Evaluation Patient Details Name: Stuart HazyKenneth W Nikolov MRN: [id number] DOB: 1933/04/28 Today's Date: 03/07/2019    History of Present Illness Pt is an 85 y/o male s/p Left foraminotomy for discectomy L4-5. PMH including but not limited to PAF, CAD and HLD.   Clinical Impression   PTA patient independent with mobility, ADLs (+ driving).  Admitted for above and limited by problem list below, including back pain, precautions adherence, impaired balance, decreased activity tolerance.  Patient spouse present and can provide assist at dc as needed.  Patient currently requires min assist for LB ADLs, min guard assist for transfers, min assist for brace mgmt, and min guard for in room mobility using RW.  Patient requires min cueing throughout session for back precaution adherence, but will have 24/7 support.  Patient educated on precautions, brace mgmt, ADL compensatory techniques, recommendations, DME and safety.  He will benefit from continued OT services while admitted in order to optimize independence and safety with ADLs, but anticipate no further needs after dc home.     Follow Up Recommendations  No OT follow up;Supervision/Assistance - 24 hour    Equipment Recommendations  None recommended by OT    Recommendations for Other Services       Precautions / Restrictions Precautions Precautions: Fall;Back Precaution Booklet Issued: Yes (comment) Precaution Comments: reviewed back precautions throughout session, pt able to recall with increased time but requires cueing to adhere to functionally Required Braces or Orthoses: Spinal Brace Spinal Brace: Lumbar corset;Applied in sitting position Restrictions Weight Bearing Restrictions: No      Mobility Bed Mobility Overal bed mobility: Needs Assistance Bed Mobility: Rolling;Sidelying to Sit Rolling: Min guard Sidelying to sit: Min guard     Sit to sidelying: Min guard General bed mobility comments: cueing for technique,  min guard and increased time to ensure log roll technique   Transfers Overall transfer level: Needs assistance Equipment used: Rolling walker (2 wheeled) Transfers: Sit to/from Stand Sit to Stand: Min guard         General transfer comment: cueing for hand placement, min guard for safety    Balance Overall balance assessment: Needs assistance Sitting-balance support: Feet supported Sitting balance-Leahy Scale: Fair     Standing balance support: During functional activity;No upper extremity supported Standing balance-Leahy Scale: Fair Standing balance comment: relaint on BUE support dynamically, able to stand during ADLs statically with out support                           ADL either performed or assessed with clinical judgement   ADL Overall ADL's : Needs assistance/impaired     Grooming: Supervision/safety;Standing;Wash/dry hands   Upper Body Bathing: Set up;Sitting   Lower Body Bathing: Minimal assistance;Sitting/lateral leans;Cueing for back precautions;Cueing for compensatory techniques   Upper Body Dressing : Minimal assistance;Sitting Upper Body Dressing Details (indicate cue type and reason): for brace Lower Body Dressing: Minimal assistance;Sit to/from stand;Cueing for compensatory techniques;Cueing for back precautions Lower Body Dressing Details (indicate cue type and reason): able to complete figure 4 technique but requires min assist reach toes without twisting while donning socks, pulling on shoes; min guard sit to stand  Toilet Transfer: Min guard;Ambulation;RW Toilet Transfer Details (indicate cue type and reason): simulated in room Toileting- Clothing Manipulation and Hygiene: Supervision/safety;Sit to/from stand       Functional mobility during ADLs: Min guard;Rolling walker;Cueing for safety       Vision  Perception     Praxis      Pertinent Vitals/Pain Pain Assessment: Faces Faces Pain Scale: Hurts little more Pain  Location: L lower back Pain Descriptors / Indicators: Guarding Pain Intervention(s): Monitored during session;Repositioned     Hand Dominance Right   Extremity/Trunk Assessment Upper Extremity Assessment Upper Extremity Assessment: Generalized weakness   Lower Extremity Assessment Lower Extremity Assessment: Defer to PT evaluation   Cervical / Trunk Assessment Cervical / Trunk Assessment: Other exceptions Cervical / Trunk Exceptions: s/p lumbar sx   Communication Communication Communication: HOH   Cognition Arousal/Alertness: Awake/alert Behavior During Therapy: Impulsive Overall Cognitive Status: Impaired/Different from baseline Area of Impairment: Memory;Safety/judgement;Problem solving                     Memory: Decreased short-term memory;Decreased recall of precautions   Safety/Judgement: Decreased awareness of safety;Decreased awareness of deficits   Problem Solving: Requires verbal cues;Slow processing;Decreased initiation;Difficulty sequencing General Comments: patient requires cueing for adherence to precautions, problem solving and sequencing during functional ADLs   General Comments  spouse present and supportive, will be able to assist as needed with LB ADLs, brace mgmt and precaution adherence    Exercises     Shoulder Instructions      Home Living Family/patient expects to be discharged to:: Private residence Living Arrangements: Spouse/significant other Available Help at Discharge: Family;Available 24 hours/day Type of Home: House Home Access: Stairs to enter [employer] of Steps: 2  Entrance Stairs-Rails: Right;Left Home Layout: One level     Bathroom Shower/Tub: Occupational psychologist: Handicapped height     Home Equipment: Environmental consultant - 2 wheels;Cane - single point;Shower seat - built in;Shower seat;Bedside commode          Prior Functioning/Environment Level of Independence: Independent                  OT Problem List: Decreased activity tolerance;Impaired balance (sitting and/or standing);Decreased cognition;Decreased safety awareness;Decreased knowledge of use of DME or AE;Decreased knowledge of precautions;Pain      OT Treatment/Interventions: Self-care/ADL training;DME and/or AE instruction;Therapeutic activities;Patient/family education;Balance training    OT Goals(Current goals can be found in the care plan section) Acute Rehab OT Goals Patient Stated Goal: decrease pain OT Goal Formulation: With patient Time For Goal Achievement: 03/21/19 Potential to Achieve Goals: Good  OT Frequency: Min 2X/week   Barriers to D/C:            Co-evaluation              AM-PAC OT "6 Clicks" Daily Activity     Outcome Measure Help from another person eating meals?: None Help from another person taking care of personal grooming?: A Little Help from another person toileting, which includes using toliet, bedpan, or urinal?: A Little Help from another person bathing (including washing, rinsing, drying)?: A Little Help from another person to put on and taking off regular upper body clothing?: A Little Help from another person to put on and taking off regular lower body clothing?: A Little 6 Click Score: 19   End of Session Equipment Utilized During Treatment: Rolling walker;Back brace Nurse Communication: Mobility status  Activity Tolerance: Patient tolerated treatment well Patient left: with call bell/phone within reach;with family/visitor present;Other (comment)(seated EOB)  OT Visit Diagnosis: Unsteadiness on feet (R26.81);Pain Pain - part of body: (back- incisional)                Time: 2694-8546 OT Time Calculation (min): 36 min Charges:  OT General Charges $OT Visit: 1 Visit OT Evaluation $OT Eval Low Complexity: 1 Low OT Treatments $Self Care/Home Management : 8-22 mins  Chancy Milroy, OT Acute Rehabilitation Services Pager [phone] Office  [phone]   Chancy Milroy 03/07/2019, 4:58 PM

## 2019-03-07 NOTE — Anesthesia Postprocedure Evaluation (Signed)
Anesthesia Post Note  Patient: Stuart Flores  Procedure(s) Performed: Left far lateral disectomy L4-5 (Left )     Patient location during evaluation: PACU Anesthesia Type: General Level of consciousness: awake Pain management: pain level controlled Vital Signs Assessment: post-procedure vital signs reviewed and stable Respiratory status: spontaneous breathing Cardiovascular status: stable Postop Assessment: no apparent nausea or vomiting Anesthetic complications: no    Last Vitals:  Vitals:   03/07/19 1307 03/07/19 1638  BP: 105/62 103/65  Pulse: (!) 50 (!) 54  Resp: 18 18  Temp:  36.7 C  SpO2: 100% 99%    Last Pain:  Vitals:   03/07/19 1638  TempSrc: Oral  PainSc:                  Tomy Khim

## 2019-03-07 NOTE — Transfer of Care (Signed)
Immediate Anesthesia Transfer of Care Note  Patient: Stuart Flores  Procedure(s) Performed: Left far lateral disectomy L4-5 (Left )  Patient Location: PACU  Anesthesia Type:General  Level of Consciousness: awake, alert , oriented and sedated  Airway & Oxygen Therapy: Patient Spontanous Breathing and Patient connected to face mask oxygen  Post-op Assessment: Report given to RN, Post -op Vital signs reviewed and stable and Patient moving all extremities  Post vital signs: Reviewed and stable  Last Vitals:  Vitals Value Taken Time  BP 165/135 03/07/19 1107  Temp    Pulse 58 03/07/19 1108  Resp 22 03/07/19 1108  SpO2 100 % 03/07/19 1108  Vitals shown include unvalidated device data.  Last Pain:  Vitals:   03/07/19 9390  TempSrc: Oral  PainSc:       Patients Stated Pain Goal: 3 (30/09/23 3007)  Complications: No apparent anesthesia complications

## 2019-03-07 NOTE — Evaluation (Signed)
Physical Therapy Evaluation Patient Details Name: Stuart Flores MRN: [id number] DOB: [birth year] Today's Date: 03/07/2019   History of Present Illness  Pt is an 85 y/o male s/p Left foraminotomy for discectomy L4-5. PMH including but not limited to PAF, CAD and HLD.  Clinical Impression  Pt presented supine in bed with HOB elevated, awake and willing to participate in therapy session. Pt's spouse present throughout session as well. Prior to admission, pt reported that he was independent with all functional mobility and ADLs. Pt lives with his wife in a single level home with a couple of steps to enter. At the time of evaluation, pt overall at min A to min guard level with use of RW to ambulate. Pt participated in stair training this session as well with min A and use of hand rail. PT provided pt education re: back precautions, spinal brace and generalized walking program for pt to initiate upon d/c home. PT will continue to follow pt acutely to progress mobility as tolerated.     Follow Up Recommendations Supervision/Assistance - 24 hour    Equipment Recommendations  None recommended by PT    Recommendations for Other Services       Precautions / Restrictions Precautions Precautions: Fall;Back Precaution Booklet Issued: Yes (comment) Precaution Comments: PT reviewed 3/3 back precautions with pt and pt's spouse throughout Required Braces or Orthoses: Spinal Brace Spinal Brace: Lumbar corset;Applied in sitting position Restrictions Weight Bearing Restrictions: No      Mobility  Bed Mobility Overal bed mobility: Needs Assistance Bed Mobility: Rolling;Sidelying to Sit;Sit to Sidelying Rolling: Min guard Sidelying to sit: Min assist;Min guard     Sit to sidelying: Min guard General bed mobility comments: pt practicing bed mobility using log roll technique with verbal and tactile cueing several times with PT and spouse present; initially requiring min A for trunk elevation, then  progressing to min guard  Transfers Overall transfer level: Needs assistance Equipment used: Rolling walker (2 wheeled) Transfers: Sit to/from Stand Sit to Stand: Min guard         General transfer comment: verbal cueing for technique and min guard for safety  Ambulation/Gait Ambulation/Gait assistance: Min guard Gait Distance (Feet): 75 Feet Assistive device: Rolling walker (2 wheeled) Gait Pattern/deviations: Step-through pattern;Decreased step length - right;Decreased step length - left;Decreased stride length Gait velocity: decreased   General Gait Details: pt with mild instability but no overt LOB or need for physical assistance, min guard for safety  Stairs Stairs: Yes Stairs assistance: Min assist Stair Management: One rail Left;Step to pattern;Forwards Number of Stairs: 3 General stair comments: constant cueing for safety and technique to maintaing back precautions  Wheelchair Mobility    Modified Rankin (Stroke Patients Only)       Balance Overall balance assessment: Needs assistance Sitting-balance support: Feet supported Sitting balance-Leahy Scale: Fair     Standing balance support: During functional activity;No upper extremity supported Standing balance-Leahy Scale: Fair                               Pertinent Vitals/Pain Pain Assessment: Faces Faces Pain Scale: Hurts little more Pain Location: L lower back Pain Descriptors / Indicators: Guarding Pain Intervention(s): Monitored during session;Repositioned    Home Living Family/patient expects to be discharged to:: Private residence Living Arrangements: Spouse/significant other Available Help at Discharge: Family;Available 24 hours/day Type of Home: House Home Access: Stairs to enter Entrance Stairs-Rails: Right;Left Entrance Stairs-Number of Steps: 2  Home Layout: One level Home Equipment: Walker - 2 wheels;Cane - single point;Shower seat - built in      Prior Function Level  of Independence: Independent               Hand Dominance        Extremity/Trunk Assessment   Upper Extremity Assessment Upper Extremity Assessment: Defer to OT evaluation;Overall WFL for tasks assessed    Lower Extremity Assessment Lower Extremity Assessment: Overall WFL for tasks assessed    Cervical / Trunk Assessment Cervical / Trunk Assessment: Other exceptions Cervical / Trunk Exceptions: s/p lumbar sx  Communication   Communication: HOH  Cognition Arousal/Alertness: Awake/alert Behavior During Therapy: Impulsive Overall Cognitive Status: Impaired/Different from baseline Area of Impairment: Memory;Safety/judgement;Problem solving                     Memory: Decreased short-term memory;Decreased recall of precautions   Safety/Judgement: Decreased awareness of safety;Decreased awareness of deficits   Problem Solving: Requires verbal cues        General Comments      Exercises     Assessment/Plan    PT Assessment Patient needs continued PT services  PT Problem List Decreased strength;Decreased activity tolerance;Decreased range of motion;Decreased balance;Decreased coordination;Decreased mobility;Decreased knowledge of use of DME;Decreased safety awareness;Decreased knowledge of precautions;Pain       PT Treatment Interventions DME instruction;Gait training;Stair training;Functional mobility training;Therapeutic activities;Therapeutic exercise;Balance training;Neuromuscular re-education;Patient/family education    PT Goals (Current goals can be found in the Care Plan section)  Acute Rehab PT Goals Patient Stated Goal: decrease pain PT Goal Formulation: With patient/family Time For Goal Achievement: 03/21/19 Potential to Achieve Goals: Good    Frequency Min 5X/week   Barriers to discharge        Co-evaluation               AM-PAC PT "6 Clicks" Mobility  Outcome Measure Help needed turning from your back to your side while in a  flat bed without using bedrails?: None Help needed moving from lying on your back to sitting on the side of a flat bed without using bedrails?: None Help needed moving to and from a bed to a chair (including a wheelchair)?: None Help needed standing up from a chair using your arms (e.g., wheelchair or bedside chair)?: None Help needed to walk in hospital room?: None Help needed climbing 3-5 steps with a railing? : A Little 6 Click Score: 23    End of Session Equipment Utilized During Treatment: Back brace Activity Tolerance: Patient tolerated treatment well Patient left: in bed;with call bell/phone within reach;with family/visitor present Nurse Communication: Mobility status PT Visit Diagnosis: Other abnormalities of gait and mobility (R26.89);Pain Pain - part of body: (back)    Time: 2119-4174 PT Time Calculation (min) (ACUTE ONLY): 38 min   Charges:   PT Evaluation $PT Eval Moderate Complexity: 1 Mod PT Treatments $Gait Training: 8-22 mins $Therapeutic Activity: 8-22 mins        Anastasio Champion, DPT  Acute Rehabilitation Services Pager [phone] Office [place] 03/07/2019, 4:12 PM

## 2019-03-07 NOTE — Plan of Care (Signed)
Pt and wife given D/C instructions with verbal understanding. Rx's were sent to pharmacy by MD. Pt's incision is clean and dry with no sign of infection. Pt's incision is clean and dry with no sign of infection. Pt's IV was removed prior to D/C. Pt D/C'd home via wheelchair per MD order. Pt is stable @ D/C and has no other needs at this time. Holli Humbles, RN

## 2019-03-07 NOTE — Discharge Instructions (Signed)
Discectomy, Care After This sheet gives you information about how to care for yourself after your procedure. Your health care provider may also give you more specific instructions. If you have problems or questions, contact your health care provider. What can I expect after the procedure? After the procedure, it is common to have:  Some pain around your incision area.  Muscle tightening (spasms) across the back.   Follow these instructions at home: Incision care  Follow instructions from your health care provider about how to take care of your incision area. Make sure you: ? Wash your hands with soap and water before and after you apply medicine to the area or change your bandage (dressing). If soap and water are not available, use hand sanitizer. ? Change your dressing as told by your health care provider. Do NOT change bandage it will be removed at your 2 week follow-up, if edges begin to peel then you may reinforce with tape.  ? Leave stitches (sutures), skin glue, or adhesive strips in place. These skin closures may need to stay in place for 2 weeks or longer. If adhesive strip edges start to loosen and curl up, you may trim the loose edges. Do not remove adhesive strips completely unless your health care provider tells you to do that.    Check your incision area every day for signs of infection. Check for: ? More redness, swelling, or pain. ? More fluid or blood. ? Warmth. ? Pus or a bad smell. Medicines  Take over-the-counter and prescription medicines only as told by your health care provider.  If you were prescribed an antibiotic medicine, use it as told by your health care provider. Do not stop using the antibiotic even if you start to feel better.  If needed, call office in 5 days to request refill of pain medications. Bathing  Do not take baths, swim, or use a hot tub for 6 weeks, or until your incision has healed completely.  If your health care provider approves, you  may take showers after your dressing has been removed.  Ok to shower in 5 days Activity  Return to your normal activities as told by your health care provider. Ask your health care provider what activities are safe for you.  Avoid bending or twisting at your waist. Always bend at your knees.  Do not sit for more than 20-30 minutes at a time. Lie down or walk between periods of sitting.  Do not lift anything that is heavier than 10 lb (4.5 kg) or the limit that your health care provider tells you, until he or she says that it is safe.  Do not drive for 2 weeks after your procedure or for as long as your health care provider tells you.    Do not drive or use heavy machinery while taking prescription pain medicine. General instructions  To prevent or treat constipation while you are taking prescription pain medicine, your health care provider may recommend that you: ? Drink enough fluid to keep your urine clear or pale yellow. ? Take over-the-counter or prescription medicines. ? Eat foods that are high in fiber, such as fresh fruits and vegetables, whole grains, and beans. ? Limit foods that are high in fat and processed sugars, such as fried and sweet foods.  Do breathing exercises as told.  Keep all follow-up visits as told by your health care provider. This is important. Contact a health care provider if:  You have more redness, swelling, or pain around  your incision area.  Your incision feels warm to the touch.  You are not able to return to activities or do exercises as told by your health care provider. Get help right away if:  You have: ? More fluid or blood coming from your incision area. ? Pus or a bad smell coming from your incision area. ? Chills or a fever. ? Episodes of dizziness or fainting while standing.  You develop a rash.  You develop shortness of breath or you have difficulty breathing.  You cannot control when you urinate or have a bowel  movement.  You become weak.  You are not able to use your legs. Summary  After the procedure, it is common to have some pain around your incision area. You may also have muscle tightening (spasms) across the back.  Follow instructions from your health care provider about how to care for your incision.  Do not lift anything that is heavier than 10 lb (4.5 kg) or the limit that your health care provider tells you, until he or she says that it is safe.  Contact your health care provider if you have more redness, swelling, or pain around your incision area or if your incision feels warm to the touch. These can be signs of infection. This information is not intended to replace advice given to you by your health care provider. Make sure you discuss any questions you have with your health care provider. Refer to this sheet in the next few weeks. These instructions provide you with information about caring for yourself after your procedure. Your health care provider may also give you more specific instructions. Your treatment has been planned according to current medical practices, but problems sometimes occur. Call your health care provider if you have any problems or questions after your procedure. What can I expect after the procedure? It is common to have pain for the first few days after the procedure. Some people continue to have mild pain even after making a full recovery. Follow these instructions at home: Medicine  Take medicines only as directed by your health care provider.  Avoid taking over-the-counter pain medicines unless your health care provider tells you otherwise. These medicines interfere with the development and growth of new bone cells.  If you were prescribed a narcotic pain medicine, take it exactly as told by your health care provider. ? Do not drink alcohol while on the medicine. ? Do not drive while on the medicine. Injury care  Care for your back brace as told by  your health care provider.  If directed, apply ice to the injured area: ? Put ice in a plastic bag. ? Place a towel between your skin and the bag. ? Leave the ice on for 20 minutes, 2-3 times a day. Activity  Perform physical therapy exercises as told by your health care provider.  Exercise regularly. Start by taking short walks. Slowly increase your activity level over time. Gentle exercise helps to ease pain.  Sit, stand, walk, turn in bed, and reposition yourself as told by your health care provider. This will help to keep your spine in proper alignment.  Avoid bending and twisting your body.  Avoid doing strenuous household chores, such as vacuuming.  Do not lift anything that is heavier than 10 lb (4.5 kg). Other Instructions  Keep all follow-up visits as directed by your health care provider. This is important.  Do not use any tobacco products, including cigarettes, chewing tobacco, or electronic cigarettes. If  you need help quitting, ask your health care provider. Nicotine affects the way bones heal. Contact a health care provider if:  Your pain gets worse.  You have a fever.  You have redness, swelling, or pain at the site of your incision.  You have fluid, blood, or pus coming from your incision.  You have numbness, tingling, or weakness in any part of your body. Get help right away if:  Your incision feels swollen and tender, and the surrounding area looks like a lump. The lump may be red or bluish in color.  You cannot move any part of your body (paralysis).  You cannot control your bladder or bowels.

## 2019-03-07 NOTE — Op Note (Signed)
Operative note  Preoperative diagnosis: Far lateral left L4-5 disc herniation with left L4 radiculopathy  Postoperative diagnosis: Same  Operative procedure: Left foraminotomy for discectomy L4-5  First Assistant: Cleta Alberts, PA  Complications: None  Indications: This is a very pleasant 85 year old gentleman presents with severe onset of radicular left L4 pain and neurological deficits.  Attempts at conservative management failed to alleviate his symptoms.  Imaging studies confirmed a large left foraminal disc herniation with marked compression of the L4 nerve root.  As result of the failure to improve with conservative measures we elected to proceed with surgery.  All appropriate risks benefits and alternatives were discussed and consent was obtained.  Operative note patient was brought the operating room placed upon the operating room table.  After successful induction of general anesthesia endotracheal ovation teds SCDs were applied he was turned prone onto the Wilson frame.  All bony prominences were well-padded and the back was prepped and draped in a standard fashion.  Timeout was taken to confirm patient procedure and all other important data.  Intraoperative fluoroscopy was used to identify the L4-5 disc space level and the L4 pars.  I marked this out and infiltrated the incision site with quarter percent Marcaine with epinephrine.  A 3 inch incision was made approximately 2 fingerbreadths lateral to the midline and sharp dissection was carried out down to the deep fascia.  The fascia was sharply incised and I bluntly dissected through the paraspinal muscles until I could palpate the facet capsule and the L4 pars.  I then sequentially placed the dilating portals for the [facility] retractor and then placed the final retractor blades to expose the L4 pars and the L4-5 facet complex.  I then placed a Penfield 4 underneath the pars took a second x-ray to confirm I was at the appropriate level.   Images in both the AP and lateral planes confirmed that I was positioned at the L4 foramen.  A 2 mm Kerrison rongeur was used to resect the small portion of the lateral aspect of the pars and the superior aspect of the facet complex.  I then dissected through the facet capsule so that I could expose the disc herniation.  I was able to identify the L4 nerve root and I placed a Penfield 4 to protect it.  Once the nerve was exposed protected and retracted the large disc fragment came into the field.  An annulotomy was performed and I used a nerve hook to mobilize the disc material.  I removed multiple large fragments of disc material with my pituitary rongeurs.  I then used my nerve hook to probe underneath the annulus and remove additional fragments of disc material.  At this point L4 nerve root was now freely mobile.  I could easily pass my nerve hook underneath the L4 nerve root and out into the extraforaminal region.  I could also use my Woodson elevator to palpate the inferior aspect of the L4 pedicle confirming that I had an adequate decompression of the L4 nerve root.  At this point I sequentially swept at the level of the disc space to confirm all fragments of disc material removed.  I irrigated the wound copiously normal saline and then obtained hemostasis using proper electrocautery as well as FloSeal.  Once I confirmed an adequate discectomy/decompression and that the amount of disc material removed was consistent with the preoperative MRI I elected to occlude the procedure.  Approximately 20 mg (1/2 cc) of Depo-Medrol was placed over the  nerve to aid in postoperative analgesia.  A thrombin-soaked Gelfoam patty was placed over the foraminotomy site and the retractor was removed.  Hemostasis was confirmed with direct visualization.  The deep fascia was then closed with interrupted #1 Vicryl sutures, then a layer 2-0 Vicryl sutures, and 3-0 Monocryl for the skin.  Steri-Strips and dry dressings were applied  and the patient was ultimately extubated transfer the PACU without incident.  The end of the case all needle sponge counts were correct.  There were no adverse intraoperative events.

## 2019-03-07 NOTE — H&P (Signed)
Addendum H&P by Dr. Rolena Infante  There is been no change in the patient's clinical exam since her office visit 02/19/2019. Patient continues to have radicular left leg pain secondary to a foraminal disc herniation at L4-5 with L4 nerve compression.  Surgical plan is to move forward with a foraminotomy and discectomy.  I have gone over the procedure as well as the risks and benefits with the patient and he is expressed an understanding and a desire to move forward with surgery.

## 2019-03-07 NOTE — Anesthesia Procedure Notes (Addendum)
Procedure Name: Intubation Date/Time: 03/07/2019 8:48 AM Performed by: Scheryl Darter, CRNA Pre-anesthesia Checklist: Patient identified, Emergency Drugs available, Suction available and Patient being monitored Patient Re-evaluated:Patient Re-evaluated prior to induction Oxygen Delivery Method: Circle System Utilized Preoxygenation: Pre-oxygenation with 100% oxygen Induction Type: IV induction Ventilation: Mask ventilation without difficulty Laryngoscope Size: Miller and 3 Grade View: Grade III Tube type: Oral Tube size: 7.5 mm Number of attempts: 1 Airway Equipment and Method: Stylet and Oral airway Placement Confirmation: ETT inserted through vocal cords under direct vision,  positive ETCO2 and breath sounds checked- equal and bilateral Secured at: 23 cm Tube secured with: Tape Dental Injury: Teeth and Oropharynx as per pre-operative assessment

## 2019-03-08 ENCOUNTER — Encounter (HOSPITAL_COMMUNITY): Payer: Self-pay | Admitting: Orthopedic Surgery

## 2019-03-08 MED FILL — Thrombin (Recombinant) For Soln 20000 Unit: CUTANEOUS | Qty: 1 | Status: AC

## 2019-03-09 NOTE — Discharge Summary (Signed)
Patient ID: Stuart Flores MRN: [id number] DOB/AGE: 12/17/1933 85 y.o.  Admit date: 03/07/2019 Discharge date: 03/09/2019  Admission Diagnoses:  Active Problems:   Lumbar radiculopathy   Discharge Diagnoses:  Active Problems:   Lumbar radiculopathy  status post Procedure(s): Left far lateral disectomy L4-5  Past Medical History:  Diagnosis Date  . Arthritis    ostearthritis- back, knee  . BPH with obstruction/lower urinary tract symptoms    urologist-  dr r. evans @[facility]   . Bradycardia   . Coronary artery disease    cardiologist-- dr  . History of glaucoma    per pt no issues since cataract extraction's bilaterally 2015  . History of squamous cell carcinoma excision    face x2  . PAF (paroxysmal atrial fibrillation) [facility])    cardiologist-- dr [facility]  . S/P drug eluting coronary stent placement 03/04/2015   DES x1 to mid CFx and DES x1 to mid LAD  . Wears glasses   . Wears hearing aid in both ears     Surgeries: Procedure(s): Left far lateral disectomy L4-5 on 03/07/2019   Consultants:   Discharged Condition: Improved  Hospital Course: Stuart Flores is an 85 y.o. male who was admitted 03/07/2019 for operative treatment of Left foraminal HNP at L4-5 causing lumbar radiculopathy. Patient failed conservative treatments (please see the history and physical for the specifics) and had severe unremitting pain that affects sleep, daily activities and work/hobbies. After pre-op clearance, the patient was taken to the operating room on 03/07/2019 and underwent  Procedure(s): Left far lateral disectomy L4-5.    Patient was given perioperative antibiotics:  Anti-infectives (From admission, onward)   Start     Dose/Rate Route Frequency Ordered Stop   03/07/19 1700  ceFAZolin (ANCEF) IVPB 2g/100 mL premix  Status:  Discontinued     2 g 200 mL/hr over 30 Minutes Intravenous Every 8 hours 03/07/19 1258 03/07/19 2102   03/07/19 0632  ceFAZolin (ANCEF) IVPB 2g/100 mL premix      2 g 200 mL/hr over 30 Minutes Intravenous 30 min pre-op 03/07/19 14/02/20 03/07/19 0857       Patient was given sequential compression devices and early ambulation to prevent DVT.   Patient benefited maximally from hospital stay and there were no complications. At the time of discharge, the patient was urinating/moving their bowels without difficulty, tolerating a regular diet, pain is controlled with oral pain medications and they have been cleared by PT/OT.   Recent vital signs: No data found.   Recent laboratory studies: No results for input(s): WBC, HGB, HCT, PLT, NA, K, CL, CO2, BUN, CREATININE, GLUCOSE, INR, CALCIUM in the last 72 hours.  Invalid input(s): PT, 2   Discharge Medications:   Allergies as of 03/07/2019   No Known Allergies     Medication List    STOP taking these medications   apixaban 5 MG Tabs tablet Commonly known as: Eliquis   multivitamin with minerals Tabs tablet   PROBIOTIC DAILY PO     TAKE these medications   atorvastatin 40 MG tablet Commonly known as: LIPITOR Take 1 tablet (40 mg total) by mouth daily. In the morning   docusate sodium 100 MG capsule Commonly known as: COLACE Take 100 mg by mouth daily.   ferrous sulfate 325 (65 FE) MG tablet Commonly known as: FerrouSul Take 1 tablet (325 mg total) by mouth every Monday, Wednesday, and Friday.   methocarbamol 500 MG tablet Commonly known as: Robaxin Take 1 tablet (500 mg  total) by mouth every 8 (eight) hours as needed for up to 5 days for muscle spasms.   metoprolol succinate 25 MG 24 hr tablet Commonly known as: TOPROL-XL TAKE 1/2 TABLET BY MOUTH EVERY DAY   nitroGLYCERIN 0.4 MG SL tablet Commonly known as: NITROSTAT Place 0.4 mg under the tongue every 5 (five) minutes as needed for chest pain (No more than 3 doses).   ondansetron 4 MG tablet Commonly known as: Zofran Take 1 tablet (4 mg total) by mouth every 8 (eight) hours as needed for up to 5 days for nausea or vomiting.    traMADol 50 MG tablet Commonly known as: ULTRAM Take 50 mg by mouth every 8 (eight) hours as needed for moderate pain. What changed: Another medication with the same name was added. Make sure you understand how and when to take each.   traMADol 50 MG tablet Commonly known as: Ultram Take 1 tablet (50 mg total) by mouth every 6 (six) hours as needed for up to 5 days. What changed: You were already taking a medication with the same name, and this prescription was added. Make sure you understand how and when to take each.       Diagnostic Studies: Dg Lumbar Spine 2-3 Views  Result Date: 03/07/2019 CLINICAL DATA:  L4-5 far left lateral discectomy EXAM: LUMBAR SPINE - 2-3 VIEW; DG C-ARM 1-60 MIN COMPARISON:  None. FLUOROSCOPY TIME:  14 seconds FINDINGS: Intraoperative fluoroscopic images during left L4-5 discectomy. Surgical probe at the L4-5 level on the lateral view. IMPRESSION: Intraoperative fluoroscopic images during L4-5 discectomy, as above. Electronically Signed   By: Julian Hy M.D.   On: 03/07/2019 10:21   Dg C-arm 1-60 Min  Result Date: 03/07/2019 CLINICAL DATA:  L4-5 far left lateral discectomy EXAM: LUMBAR SPINE - 2-3 VIEW; DG C-ARM 1-60 MIN COMPARISON:  None. FLUOROSCOPY TIME:  14 seconds FINDINGS: Intraoperative fluoroscopic images during left L4-5 discectomy. Surgical probe at the L4-5 level on the lateral view. IMPRESSION: Intraoperative fluoroscopic images during L4-5 discectomy, as above. Electronically Signed   By: Julian Hy M.D.   On: 03/07/2019 10:21    Discharge Instructions    Incentive spirometry RT   Complete by: As directed         Discharge Plan:  discharge to home  Disposition: stable    Signed: Yvonne Kendall Ward for [facility] PA-C [facility] [phone] 03/09/2019, 1:15 PM

## 2019-04-09 ENCOUNTER — Emergency Department (HOSPITAL_COMMUNITY)
Admission: EM | Admit: 2019-04-09 | Discharge: 2019-04-10 | Disposition: A | Discharge status: Home / Self Care | Payer: Medicare Other | Attending: Emergency Medicine | Admitting: Emergency Medicine

## 2019-04-09 ENCOUNTER — Other Ambulatory Visit: Payer: Self-pay

## 2019-04-09 ENCOUNTER — Encounter (HOSPITAL_COMMUNITY): Payer: Self-pay | Admitting: Emergency Medicine

## 2019-04-09 DIAGNOSIS — Z79899 Other long term (current) drug therapy: Secondary | ICD-10-CM | POA: Insufficient documentation

## 2019-04-09 DIAGNOSIS — E871 Hypo-osmolality and hyponatremia: Secondary | ICD-10-CM | POA: Diagnosis not present

## 2019-04-09 DIAGNOSIS — I4891 Unspecified atrial fibrillation: Secondary | ICD-10-CM | POA: Insufficient documentation

## 2019-04-09 DIAGNOSIS — I2510 Atherosclerotic heart disease of native coronary artery without angina pectoris: Secondary | ICD-10-CM | POA: Insufficient documentation

## 2019-04-09 DIAGNOSIS — Z87891 Personal history of nicotine dependence: Secondary | ICD-10-CM | POA: Insufficient documentation

## 2019-04-09 DIAGNOSIS — R7989 Other specified abnormal findings of blood chemistry: Secondary | ICD-10-CM | POA: Diagnosis present

## 2019-04-09 LAB — COMPREHENSIVE METABOLIC PANEL
ALT: 18 U/L (ref 0–44)
AST: 30 U/L (ref 15–41)
Albumin: 3.8 g/dL (ref 3.5–5.0)
Alkaline Phosphatase: 41 U/L (ref 38–126)
Anion gap: 8 (ref 5–15)
BUN: 19 mg/dL (ref 8–23)
CO2: 26 mmol/L (ref 22–32)
Calcium: 8.6 mg/dL — ABNORMAL LOW (ref 8.9–10.3)
Chloride: 98 mmol/L (ref 98–111)
Creatinine, Ser: 1.05 mg/dL (ref 0.61–1.24)
GFR calc Af Amer: >60 mL/min (ref >60)
GFR calc non Af Amer: >60 mL/min (ref >60)
Glucose, Bld: 103 mg/dL — ABNORMAL HIGH (ref 70–99)
Potassium: 4.2 mmol/L (ref 3.5–5.1)
Sodium: 132 mmol/L — ABNORMAL LOW (ref 135–145)
Total Bilirubin: 0.8 mg/dL (ref 0.3–1.2)
Total Protein: 6 g/dL — ABNORMAL LOW (ref 6.5–8.1)

## 2019-04-09 LAB — CBC WITH DIFFERENTIAL/PLATELET
Abs Immature Granulocytes: 0.02 10*3/uL (ref 0.00–0.07)
Basophils Absolute: 0 10*3/uL (ref 0.0–0.1)
Basophils Relative: 0 %
Eosinophils Absolute: 0 10*3/uL (ref 0.0–0.5)
Eosinophils Relative: 0 %
HCT: 35.2 % — ABNORMAL LOW (ref 39.0–52.0)
Hemoglobin: 11.8 g/dL — ABNORMAL LOW (ref 13.0–17.0)
Immature Granulocytes: 0 %
Lymphocytes Relative: 16 %
Lymphs Abs: 0.8 10*3/uL (ref 0.7–4.0)
MCH: 31.2 pg (ref 26.0–34.0)
MCHC: 33.5 g/dL (ref 30.0–36.0)
MCV: 93.1 fL (ref 80.0–100.0)
Monocytes Absolute: 1.5 10*3/uL — ABNORMAL HIGH (ref 0.1–1.0)
Monocytes Relative: 28 %
Neutro Abs: 2.9 10*3/uL (ref 1.7–7.7)
Neutrophils Relative %: 56 %
Platelets: 202 10*3/uL (ref 150–400)
RBC: 3.78 MIL/uL — ABNORMAL LOW (ref 4.22–5.81)
RDW: 14.2 % (ref 11.5–15.5)
WBC: 5.3 10*3/uL (ref 4.0–10.5)
nRBC: 0 % (ref 0.0–0.2)

## 2019-04-09 NOTE — ED Triage Notes (Signed)
Pt states his PCP sent him here to be check for abnormal sodium level, pt unable to said if is high or low.

## 2019-04-10 NOTE — Discharge Instructions (Addendum)
Your sodium today was 132, which is only slightly low, and not dangerously low.  If you sign up for MyChart, you can check all your lab tests done in the [facility].

## 2019-04-10 NOTE — ED Provider Notes (Signed)
[facility] EMERGENCY DEPARTMENT Provider Note   CSN: [id number] Arrival date & time: 04/09/19  1824   History Chief Complaint  Patient presents with  . Abnormal Lab    Stuart Flores is a 86 y.o. male.  The history is provided by the patient.  Abnormal Lab He has history of coronary artery disease, paroxysmal atrial fibrillation but is not anticoagulated and comes in because of a low sodium.  He saw his primary care provider who drew some blood earlier today.  He got a call at home saying his sodium was low and he should come to the hospital.  He has no complaints.  Specifically, he denies nausea, vomiting, diarrhea.  He denies dizziness or lightheadedness.  He denies exposure to COVID-19.  Past Medical History:  Diagnosis Date  . Arthritis    ostearthritis- back, knee  . BPH with obstruction/lower urinary tract symptoms    urologist-  dr r. evans @[facility]   . Bradycardia   . Coronary artery disease    cardiologist-- dr  . History of glaucoma    per pt no issues since cataract extraction's bilaterally 2015  . History of squamous cell carcinoma excision    face x2  . PAF (paroxysmal atrial fibrillation) [facility])    cardiologist-- dr [facility]  . S/P drug eluting coronary stent placement 03/04/2015   DES x1 to mid CFx and DES x1 to mid LAD  . Wears glasses   . Wears hearing aid in both ears     Patient Active Problem List   Diagnosis Date Noted  . Lumbar radiculopathy 03/07/2019  . Memory loss 05/31/2018  . Hyponatremia   . PAF (paroxysmal atrial fibrillation) (HCC)   . CAD (coronary artery disease) 03/31/2018  . Acute encephalopathy 03/30/2018  . S/P left TKA 03/09/2018  . S/P knee replacement 03/09/2018  . 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 OA (osteoarthritis) of knee 10/27/2015  . Post PTCA 03/04/2015  . Angina effort 03/03/2015  . Hyperlipidemia 03/03/2015    Past Surgical History:  Procedure Laterality Date  . CARDIAC  CATHETERIZATION N/A 03/04/2015   Procedure: Left Heart Cath and Coronary Angiography;  Surgeon: 03/06/2015, MD;  Location: [facility] INVASIVE CV LAB;  Service: Cardiovascular;  Laterality: N/A;  . CARDIAC CATHETERIZATION  03/04/2015   Procedure: Coronary Stent Intervention;  Surgeon: 03/06/2015, MD;  Location: [facility] INVASIVE CV LAB;  Service: Cardiovascular;;  DES to mCFx and mLAD  . CARDIOVASCULAR STRESS TEST  12-27-2016   dr 12-29-2016   Low risk nuclear study w/ no ischemia/  normal LV function and wall function, ef 67%  . CATARACT EXTRACTION W/ INTRAOCULAR LENS  IMPLANT, BILATERAL Bilateral 2015  . EYE SURGERY     Bilateral cataracts x2  . HAND SURGERY Left early 2000s   "thumb surgery for Tendon pair"  . KNEE ARTHROSCOPY Bilateral   . LUMBAR LAMINECTOMY/DECOMPRESSION MICRODISCECTOMY Left 03/07/2019   Procedure: Left far lateral disectomy L4-5;  Surgeon: 14/05/2018, MD;  Location: [facility] OR;  Service: Orthopedics;  Laterality: Left;  2 hrs  . STERIOD INJECTION Left 11/24/2017   Procedure: LEFT KNEE CORTISONE INJECTION;  Surgeon: 11/26/2017, MD;  Location: [facility] ORS;  Service: Orthopedics;  Laterality: Left;  . TONSILLECTOMY  child  . TOTAL KNEE ARTHROPLASTY Right 10/27/2015   Procedure: TOTAL RIGHT KNEE ARTHROPLASTY;  Surgeon: 10/29/2015, MD;  Location: [facility] ORS;  Service: Orthopedics;  Laterality: Right;  . TOTAL KNEE ARTHROPLASTY Left 03/09/2018   Procedure: TOTAL  KNEE ARTHROPLASTY;  Surgeon: Paralee Cancel, MD;  Location: [facility] ORS;  Service: Orthopedics;  Laterality: Left;  70 mins with block  . TOTAL KNEE REVISION WITH SCAR DEBRIDEMENT/PATELLA REVISION WITH POLY EXCHANGE Right 11/24/2017   Procedure: RIGHT KNEE OPEN ARTHROTOMY WITH SCAR DEBRIDEMENT AND POSSIBLE POLY EXCHANGE;  Surgeon: Paralee Cancel, MD;  Location: [facility] ORS;  Service: Orthopedics;  Laterality: Right;  90 mins  . TRANSTHORACIC ECHOCARDIOGRAM  01-20-2017   dr Einar Gip   mild concentric LVH, ef 55%/  trace AR/  mild MR, TR and  PR       Family  History  Problem Relation Age of Onset  . Heart attack Mother   . Arthritis/Rheumatoid Mother   . Pancreatic cancer Father   . Other Brother        car accident    Social History   Tobacco Use  . Smoking status: Former Smoker    Packs/day: 1.00    Years: 9.00    Pack years: 9.00    Types: Cigarettes    Quit date: 12/04/1963    Years since quitting: 55.3  . Smokeless tobacco: Never Used  . Tobacco comment: stopped smoking cigarettes at age 54  Substance Use Topics  . Alcohol use: Yes    Alcohol/week: 9.0 standard drinks    Types: 9 Glasses of wine per week    Comment: wine or beer daily  . Drug use: Never    Home Medications Prior to Admission medications   Medication Sig Start Date End Date Taking? Authorizing Provider  atorvastatin (LIPITOR) 40 MG tablet Take 1 tablet (40 mg total) by mouth daily. In the morning 01/19/19   Adrian Prows, MD  docusate sodium (COLACE) 100 MG capsule Take 100 mg by mouth daily.     [provider]  ferrous sulfate (FERROUSUL) 325 (65 FE) MG tablet Take 1 tablet (325 mg total) by mouth every Monday, Wednesday, and Friday. 04/03/18   Florencia Reasons, MD  metoprolol succinate (TOPROL-XL) 25 MG 24 hr tablet TAKE 1/2 TABLET BY MOUTH EVERY DAY Patient taking differently: Take 12.5 mg by mouth daily.  10/02/18   Adrian Prows, MD  nitroGLYCERIN (NITROSTAT) 0.4 MG SL tablet Place 0.4 mg under the tongue every 5 (five) minutes as needed for chest pain (No more than 3 doses).  02/25/15   [provider]  traMADol (ULTRAM) 50 MG tablet Take 50 mg by mouth every 8 (eight) hours as needed for moderate pain.    [provider]    Allergies    Patient has no known allergies.  Review of Systems   Review of Systems  All other systems reviewed and are negative.   Physical Exam Updated Vital Signs BP (!) 102/57 (BP Location: Right Arm)   Pulse (!) 57   Temp 98.2 F (36.8 C) (Oral)   Resp 15   Ht 6' (1.829 m)   Wt 73.5 kg   SpO2 99%    BMI 21.97 kg/m   Physical Exam Vitals and nursing note reviewed.   86 year old male, resting comfortably and in no acute distress. Vital signs are normal. Oxygen saturation is 99%, which is normal. Head is normocephalic and atraumatic. PERRLA, EOMI. Oropharynx is clear. Neck is nontender and supple without adenopathy or JVD. Back is nontender and there is no CVA tenderness. Lungs are clear without rales, wheezes, or rhonchi. Chest is nontender. Heart has regular rate and rhythm without murmur. Abdomen is soft, flat, nontender without masses or hepatosplenomegaly and peristalsis  is normoactive. Extremities have no cyanosis or edema, full range of motion is present. Skin is warm and dry without rash. Neurologic: Mental status is normal, cranial nerves are intact, there are no motor or sensory deficits.  ED Results / Procedures / Treatments   Labs (all labs ordered are listed, but only abnormal results are displayed) Labs Reviewed  CBC WITH DIFFERENTIAL/PLATELET - Abnormal; Notable for the following components:      Result Value   RBC 3.78 (*)    Hemoglobin 11.8 (*)    HCT 35.2 (*)    Monocytes Absolute 1.5 (*)    All other components within normal limits  COMPREHENSIVE METABOLIC PANEL - Abnormal; Notable for the following components:   Sodium 132 (*)    Glucose, Bld 103 (*)    Calcium 8.6 (*)    Total Protein 6.0 (*)    All other components within normal limits   Procedures Procedures  Medications Ordered in ED Medications - No data to display  ED Course  I have reviewed the triage vital signs and the nursing notes.  Pertinent lab results that were available during my care of the patient were reviewed by me and considered in my medical decision making (see chart for details).  MDM Rules/Calculators/A&P Hyponatremia.  Labs are repeated here and sodium is 132.  Well low, it is not dangerously low and does not require any specific treatment.  Review of his medications does  not show any that are likely to cause hyponatremia.  This will just need to be followed as an outpatient.  He is also noted to have a borderline anemia, which will also need to be followed as an outpatient.  Old records are reviewed, and he has had hyponatremia in a similar range in December 2019.  Final Clinical Impression(s) / ED Diagnoses Final diagnoses:  Hyponatremia   Rx / DC Orders ED Discharge Orders    None       Dione Booze, MD 04/10/19 [phone]

## 2019-04-11 ENCOUNTER — Other Ambulatory Visit: Payer: Self-pay | Admitting: Internal Medicine

## 2019-04-11 DIAGNOSIS — R404 Transient alteration of awareness: Secondary | ICD-10-CM

## 2019-04-13 ENCOUNTER — Telehealth: Payer: Self-pay | Admitting: Unknown Physician Specialty

## 2019-04-13 ENCOUNTER — Other Ambulatory Visit: Payer: Self-pay | Admitting: Unknown Physician Specialty

## 2019-04-13 DIAGNOSIS — U071 COVID-19: Secondary | ICD-10-CM

## 2019-04-13 NOTE — Telephone Encounter (Signed)
  I connected by phone with Stuart Flores on 04/13/2019 at 2:03 PM to discuss the potential use of an new treatment for mild to moderate COVID-19 viral infection in non-hospitalized patients.  This patient is a 86 y.o. male that meets the FDA criteria for Emergency Use Authorization of bamlanivimab or casirivimab\imdevimab.  Has a (+) direct SARS-CoV-2 viral test result  Has mild or moderate COVID-19   Is ? 86 years of age and weighs ? 40 kg  Is NOT hospitalized due to COVID-19  Is NOT requiring oxygen therapy or requiring an increase in baseline oxygen flow rate due to COVID-19  Is within 10 days of symptom onset  Has at least one of the high risk factor(s) for progression to severe COVID-19 and/or hospitalization as defined in EUA.  Specific high risk criteria : >/= 84 yo and cardiovascular disease treated by PCP   I have spoken and communicated the following to the patient or parent/caregiver:  1. FDA has authorized the emergency use of bamlanivimab and casirivimab\imdevimab for the treatment of mild to moderate COVID-19 in adults and pediatric patients with positive results of direct SARS-CoV-2 viral testing who are 107 years of age and older weighing at least 40 kg, and who are at high risk for progressing to severe COVID-19 and/or hospitalization.  2. The significant known and potential risks and benefits of bamlanivimab and casirivimab\imdevimab, and the extent to which such potential risks and benefits are unknown.  3. Information on available alternative treatments and the risks and benefits of those alternatives, including clinical trials.  4. Patients treated with bamlanivimab and casirivimab\imdevimab should continue to self-isolate and use infection control measures (e.g., wear mask, isolate, social distance, avoid sharing personal items, clean and disinfect "high touch" surfaces, and frequent handwashing) according to CDC guidelines.   5. The patient or parent/caregiver  has the option to accept or refuse bamlanivimab or casirivimab\imdevimab .  After reviewing this information with the patient, The patient agreed to proceed with receiving the bamlanimivab infusion and will be provided a copy of the Fact sheet prior to receiving the infusion.Gabriel Cirri 04/13/2019 2:03 PM  Sx onset 1/6 runny nose and cough

## 2019-04-14 ENCOUNTER — Other Ambulatory Visit: Payer: Self-pay | Admitting: Cardiology

## 2019-04-16 ENCOUNTER — Other Ambulatory Visit: Payer: Self-pay

## 2019-04-16 MED ORDER — APIXABAN 5 MG PO TABS: 5.0000 mg | ORAL_TABLET | Freq: Two times a day (BID) | ORAL | 3 refills | Status: DC

## 2019-04-17 ENCOUNTER — Ambulatory Visit (HOSPITAL_COMMUNITY)
Admission: RE | Admit: 2019-04-17 | Discharge: 2019-04-17 | Disposition: A | Discharge status: Home / Self Care | Payer: Medicare Other | Source: Ambulatory Visit | Attending: Pulmonary Disease | Admitting: Pulmonary Disease

## 2019-04-17 DIAGNOSIS — Z23 Encounter for immunization: Secondary | ICD-10-CM | POA: Insufficient documentation

## 2019-04-17 DIAGNOSIS — U071 COVID-19: Secondary | ICD-10-CM | POA: Insufficient documentation

## 2019-04-17 MED ORDER — SODIUM CHLORIDE 0.9 % IV SOLN
700.0000 mg | Freq: Once | INTRAVENOUS | Status: AC
  Administered 2019-04-17: 700 mg via INTRAVENOUS
  Filled 2019-04-17: qty 20

## 2019-04-17 MED ORDER — ALBUTEROL SULFATE HFA 108 (90 BASE) MCG/ACT IN AERS: 2.0000 | INHALATION_SPRAY | Freq: Once | RESPIRATORY_TRACT | Status: DC | PRN

## 2019-04-17 MED ORDER — METHYLPREDNISOLONE SODIUM SUCC 125 MG IJ SOLR: 125.0000 mg | Freq: Once | INTRAMUSCULAR | Status: DC | PRN

## 2019-04-17 MED ORDER — EPINEPHRINE 0.3 MG/0.3ML IJ SOAJ: 0.3000 mg | Freq: Once | INTRAMUSCULAR | Status: DC | PRN

## 2019-04-17 MED ORDER — FAMOTIDINE IN NACL 20-0.9 MG/50ML-% IV SOLN: 20.0000 mg | Freq: Once | INTRAVENOUS | Status: DC | PRN

## 2019-04-17 MED ORDER — SODIUM CHLORIDE 0.9 % IV SOLN
INTRAVENOUS | Status: DC | PRN
  Administered 2019-04-17: 250 mL via INTRAVENOUS

## 2019-04-17 MED ORDER — DIPHENHYDRAMINE HCL 50 MG/ML IJ SOLN: 50.0000 mg | Freq: Once | INTRAMUSCULAR | Status: DC | PRN

## 2019-04-17 NOTE — Progress Notes (Signed)
  Diagnosis: COVID-19  Physician: Dr. Wright  Procedure: [facility] Med: bamlanivimab infusion - Provided patient with bamlanimivab fact sheet for patients, parents and caregivers prior to infusion.  Complications: No immediate complications noted.  Discharge: Discharged home   Teneka Malmberg N Gifford Ballon 04/17/2019  

## 2019-04-17 NOTE — Discharge Instructions (Signed)
10 Things You Can Do to Manage Your COVID-19 Symptoms at Home If you have possible or confirmed COVID-19: 1. Stay home from work and school. And stay away from other public places. If you must go out, avoid using any kind of public transportation, ridesharing, or taxis. 2. Monitor your symptoms carefully. If your symptoms get worse, call your healthcare provider immediately. 3. Get rest and stay hydrated. 4. If you have a medical appointment, call the healthcare provider ahead of time and tell them that you have or may have COVID-19. 5. For medical emergencies, call 911 and notify the dispatch personnel that you have or may have COVID-19. 6. Cover your cough and sneezes with a tissue or use the inside of your elbow. 7. Wash your hands often with soap and water for at least 20 seconds or clean your hands with an alcohol-based hand sanitizer that contains at least 60% alcohol. 8. As much as possible, stay in a specific room and away from other people in your home. Also, you should use a separate bathroom, if available. If you need to be around other people in or outside of the home, wear a mask. 9. Avoid sharing personal items with other people in your household, like dishes, towels, and bedding. 10. Clean all surfaces that are touched often, like counters, tabletops, and doorknobs. Use household cleaning sprays or wipes according to the label instructions. cdc.gov/coronavirus 10/04/2018 This information is not intended to replace advice given to you by your health care provider. Make sure you discuss any questions you have with your health care provider. Document Revised: 03/08/2019 Document Reviewed: 03/08/2019 Elsevier Patient Education  2020 [employer]. Mr.  

## 2019-04-20 ENCOUNTER — Other Ambulatory Visit: Payer: Self-pay

## 2019-04-20 ENCOUNTER — Other Ambulatory Visit: Payer: Medicare Other

## 2019-04-20 MED ORDER — APIXABAN 5 MG PO TABS: 5.0000 mg | ORAL_TABLET | Freq: Two times a day (BID) | ORAL | 3 refills | Status: DC

## 2019-04-25 ENCOUNTER — Ambulatory Visit
Admission: RE | Admit: 2019-04-25 | Discharge: 2019-04-25 | Disposition: A | Discharge status: Home / Self Care | Payer: Medicare Other | Source: Ambulatory Visit | Attending: Internal Medicine | Admitting: Internal Medicine

## 2019-04-25 ENCOUNTER — Other Ambulatory Visit: Payer: Self-pay

## 2019-04-25 DIAGNOSIS — R404 Transient alteration of awareness: Secondary | ICD-10-CM

## 2019-05-27 ENCOUNTER — Other Ambulatory Visit: Payer: Self-pay

## 2019-05-27 ENCOUNTER — Encounter (HOSPITAL_COMMUNITY): Payer: Self-pay

## 2019-05-27 ENCOUNTER — Emergency Department (HOSPITAL_COMMUNITY)
Admission: EM | Admit: 2019-05-27 | Discharge: 2019-05-27 | Disposition: A | Discharge status: Home / Self Care | Payer: Medicare Other | Attending: Emergency Medicine | Admitting: Emergency Medicine

## 2019-05-27 ENCOUNTER — Emergency Department (HOSPITAL_COMMUNITY): Payer: Medicare Other

## 2019-05-27 DIAGNOSIS — Z87891 Personal history of nicotine dependence: Secondary | ICD-10-CM | POA: Diagnosis not present

## 2019-05-27 DIAGNOSIS — Z955 Presence of coronary angioplasty implant and graft: Secondary | ICD-10-CM | POA: Insufficient documentation

## 2019-05-27 DIAGNOSIS — R001 Bradycardia, unspecified: Secondary | ICD-10-CM | POA: Insufficient documentation

## 2019-05-27 DIAGNOSIS — R079 Chest pain, unspecified: Secondary | ICD-10-CM | POA: Diagnosis not present

## 2019-05-27 DIAGNOSIS — Z96653 Presence of artificial knee joint, bilateral: Secondary | ICD-10-CM | POA: Diagnosis not present

## 2019-05-27 DIAGNOSIS — I2510 Atherosclerotic heart disease of native coronary artery without angina pectoris: Secondary | ICD-10-CM | POA: Diagnosis not present

## 2019-05-27 LAB — BASIC METABOLIC PANEL
Anion gap: 10 (ref 5–15)
BUN: 19 mg/dL (ref 8–23)
CO2: 25 mmol/L (ref 22–32)
Calcium: 8.6 mg/dL — ABNORMAL LOW (ref 8.9–10.3)
Chloride: 105 mmol/L (ref 98–111)
Creatinine, Ser: 0.97 mg/dL (ref 0.61–1.24)
GFR calc Af Amer: >60 mL/min (ref >60)
GFR calc non Af Amer: >60 mL/min (ref >60)
Glucose, Bld: 92 mg/dL (ref 70–99)
Potassium: 4.4 mmol/L (ref 3.5–5.1)
Sodium: 140 mmol/L (ref 135–145)

## 2019-05-27 LAB — CBC
HCT: 37.9 % — ABNORMAL LOW (ref 39.0–52.0)
Hemoglobin: 12 g/dL — ABNORMAL LOW (ref 13.0–17.0)
MCH: 31.3 pg (ref 26.0–34.0)
MCHC: 31.7 g/dL (ref 30.0–36.0)
MCV: 98.7 fL (ref 80.0–100.0)
Platelets: 222 10*3/uL (ref 150–400)
RBC: 3.84 MIL/uL — ABNORMAL LOW (ref 4.22–5.81)
RDW: 14.9 % (ref 11.5–15.5)
WBC: 7.4 10*3/uL (ref 4.0–10.5)
nRBC: 0 % (ref 0.0–0.2)

## 2019-05-27 LAB — TROPONIN I (HIGH SENSITIVITY)
Troponin I (High Sensitivity): 6 ng/L (ref <18)
Troponin I (High Sensitivity): 14 ng/L (ref <18)

## 2019-05-27 MED ORDER — SODIUM CHLORIDE 0.9% FLUSH: 3.0000 mL | Freq: Once | INTRAVENOUS | Status: DC

## 2019-05-27 MED ORDER — ASPIRIN 81 MG PO CHEW
162.0000 mg | CHEWABLE_TABLET | Freq: Once | ORAL | Status: AC
  Administered 2019-05-27: 06:00:00 162 mg via ORAL
  Filled 2019-05-27: qty 2

## 2019-05-27 NOTE — ED Notes (Signed)
Pt discharged at this time. Discharge instructions reviewed, pt verbalizes understanding. Questions answered. Pt contacted his wife to come and pick him up.

## 2019-05-27 NOTE — ED Notes (Signed)
Pt came to the ED via EMS. Pt conscious, breathing, and A&Ox4. Pt brought back to [facility] via stretcher. Pt endorses "My chest pain woke me up this morning. It was pressure in the center of my chest". Chest rise and fall equally with non-labored breathing. Lungs clear apex to base. Abd soft and non-tender. Pt currently denies chest pain, n/v/d, shortness of breath, and f/c. PTA pt took two NTG and 162 mg of ASA with relief. PIVC placed on the left hand with a 20G by EMS which had positive blood return and flushed without pain or infiltration. Blood collected, labeled, and sent to lab. Bed in lowest position with call light within reach. Pt on continuous blood pressure, pulse ox, and cardiac monitor. Will continue to monitor. Awaiting MD eval. No distress noted.

## 2019-05-27 NOTE — ED Notes (Signed)
Repeat troponin sent to lab

## 2019-05-27 NOTE — ED Notes (Signed)
Medications given and charted per MAR. Tolerated well.  

## 2019-05-27 NOTE — ED Notes (Signed)
Pt care endorsed to Jennifer RN.  

## 2019-05-27 NOTE — ED Provider Notes (Signed)
Emergency Department Provider Note   I have reviewed the triage vital signs and the nursing notes.   HISTORY  Chief Complaint Chest Pain (PTA 2 NTG and 162mg  ASA. Denies CP now.)   HPI Stuart Flores is a 86 y.o. male with medical problems as documented below to include coronary artery disease status post stent x2 who presents the emerge department today with chest pain.  Patient states he was sleeping and woken up with a mild left-sided chest pressure.  Did not radiate.  Did not feel lightheaded, shortness of breath, diaphoretic, arm pain or nauseous.  Patient states that he took a couple nitroglycerin which did not seem to help and then he took 281 mg aspirins and about 10 or 15 minutes later the pain seemed to have subsided.  The patient states that EMS was called brought him here for further evaluation.  At this time he is chest pain-free and has been chest pain-free since this resolved.  He states no chest pain since approximately 0315. no recent illnesses, fever or cough.  No other associated or modifying symptoms.    Past Medical History:  Diagnosis Date  . Arthritis    ostearthritis- back, knee  . BPH with obstruction/lower urinary tract symptoms    urologist-  dr r. evans @[facility]   . Bradycardia   . Coronary artery disease    cardiologist-- dr 91  . History of glaucoma    per pt no issues since cataract extraction's bilaterally 2015  . History of squamous cell carcinoma excision    face x2  . PAF (paroxysmal atrial fibrillation) [facility])    cardiologist-- dr Jacinto Halim  . S/P drug eluting coronary stent placement 03/04/2015   DES x1 to mid CFx and DES x1 to mid LAD  . Wears glasses   . Wears hearing aid in both ears     Patient Active Problem List   Diagnosis Date Noted  . Lumbar radiculopathy 03/07/2019  . Memory loss 05/31/2018  . Hyponatremia   . PAF (paroxysmal atrial fibrillation) (HCC)   . CAD (coronary artery disease) 03/31/2018  . Acute encephalopathy  03/30/2018  . S/P left TKA 03/09/2018  . S/P knee replacement 03/09/2018  . 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 OA (osteoarthritis) of knee 10/27/2015  . Post PTCA 03/04/2015  . Angina effort 03/03/2015  . Hyperlipidemia 03/03/2015    Past Surgical History:  Procedure Laterality Date  . CARDIAC CATHETERIZATION N/A 03/04/2015   Procedure: Left Heart Cath and Coronary Angiography;  Surgeon: 03/05/2015, MD;  Location: [facility] INVASIVE CV LAB;  Service: Cardiovascular;  Laterality: N/A;  . CARDIAC CATHETERIZATION  03/04/2015   Procedure: Coronary Stent Intervention;  Surgeon: [facility], MD;  Location: [facility] INVASIVE CV LAB;  Service: Cardiovascular;;  DES to mCFx and mLAD  . CARDIOVASCULAR STRESS TEST  12-27-2016   dr [facility]   Low risk nuclear study w/ no ischemia/  normal LV function and wall function, ef 67%  . CATARACT EXTRACTION W/ INTRAOCULAR LENS  IMPLANT, BILATERAL Bilateral 2015  . EYE SURGERY     Bilateral cataracts x2  . HAND SURGERY Left early 2000s   "thumb surgery for Tendon pair"  . KNEE ARTHROSCOPY Bilateral   . LUMBAR LAMINECTOMY/DECOMPRESSION MICRODISCECTOMY Left 03/07/2019   Procedure: Left far lateral disectomy L4-5;  Surgeon: 05[birth year]09-1971, MD;  Location: [facility] OR;  Service: Orthopedics;  Laterality: Left;  2 hrs  . STERIOD INJECTION Left 11/24/2017   Procedure: LEFT KNEE CORTISONE INJECTION;  Surgeon: Paralee Cancel, MD;  Location: [facility] ORS;  Service: Orthopedics;  Laterality: Left;  . TONSILLECTOMY  child  . TOTAL KNEE ARTHROPLASTY Right 10/27/2015   Procedure: TOTAL RIGHT KNEE ARTHROPLASTY;  Surgeon: Gaynelle Arabian, MD;  Location: [facility] ORS;  Service: Orthopedics;  Laterality: Right;  . TOTAL KNEE ARTHROPLASTY Left 03/09/2018   Procedure: TOTAL KNEE ARTHROPLASTY;  Surgeon: Paralee Cancel, MD;  Location: [facility] ORS;  Service: Orthopedics;  Laterality: Left;  70 mins with block  . TOTAL KNEE REVISION WITH SCAR DEBRIDEMENT/PATELLA REVISION WITH POLY EXCHANGE Right 11/24/2017    Procedure: RIGHT KNEE OPEN ARTHROTOMY WITH SCAR DEBRIDEMENT AND POSSIBLE POLY EXCHANGE;  Surgeon: Paralee Cancel, MD;  Location: [facility] ORS;  Service: Orthopedics;  Laterality: Right;  90 mins  . TRANSTHORACIC ECHOCARDIOGRAM  01-20-2017   dr Einar Gip   mild concentric LVH, ef 55%/  trace AR/  mild MR, TR and  PR    Current Outpatient Rx  . Order #: [id number] Class: Normal  . Order #: [id number] Class: Normal  . Order #: [id number] Class: No Print  . Order #: [id number] Class: Normal  . Order #: [id number] Class: Historical Med    Allergies Patient has no known allergies.  Family History  Problem Relation Age of Onset  . Heart attack Mother   . Arthritis/Rheumatoid Mother   . Pancreatic cancer Father   . Other Brother        car accident    Social History Social History   Tobacco Use  . Smoking status: Former Smoker    Packs/day: 1.00    Years: 9.00    Pack years: 9.00    Types: Cigarettes    Quit date: 12/04/1963    Years since quitting: 55.5  . Smokeless tobacco: Never Used  . Tobacco comment: stopped smoking cigarettes at age 41  Substance Use Topics  . Alcohol use: Yes    Alcohol/week: 9.0 standard drinks    Types: 9 Glasses of wine per week    Comment: wine or beer daily  . Drug use: Never    Review of Systems  All other systems negative except as documented in the HPI. All pertinent positives and negatives as reviewed in the HPI. ____________________________________________   PHYSICAL EXAM:  VITAL SIGNS: ED Triage Vitals  Enc Vitals Group     BP 05/27/19 0451 115/77     Pulse Rate 05/27/19 0451 (!) 47     Resp 05/27/19 0451 18     Temp 05/27/19 0451 97.7 F (36.5 C)     Temp Source 05/27/19 0451 Oral     SpO2 05/27/19 0451 100 %    Constitutional: Alert and oriented. Well appearing and in no acute distress. Eyes: Conjunctivae are normal. PERRL. EOMI. Head: Atraumatic. Nose: No congestion/rhinnorhea. Mouth/Throat: Mucous membranes are moist.  Oropharynx  non-erythematous. Neck: No stridor.  No meningeal signs.   Cardiovascular: Normal rate, regular rhythm. Good peripheral circulation. Grossly normal heart sounds.   Respiratory: Normal respiratory effort.  No retractions. Lungs CTAB. Gastrointestinal: Soft and nontender. No distention.  Musculoskeletal: No lower extremity tenderness nor edema. No gross deformities of extremities. Neurologic:  Normal speech and language. No gross focal neurologic deficits are appreciated.  Skin:  Skin is warm, dry and intact. No rash noted.  ____________________________________________   LABS (all labs ordered are listed, but only abnormal results are displayed)  Labs Reviewed  BASIC METABOLIC PANEL - Abnormal; Notable for the following components:      Result Value   Calcium 8.6 (*)    All  other components within normal limits  CBC - Abnormal; Notable for the following components:   RBC 3.84 (*)    Hemoglobin 12.0 (*)    HCT 37.9 (*)    All other components within normal limits  TROPONIN I (HIGH SENSITIVITY)  TROPONIN I (HIGH SENSITIVITY)   ____________________________________________  EKG   EKG Interpretation  Date/Time:  Sunday May 27 2019 04:51:16 EST Ventricular Rate:  47 PR Interval:    QRS Duration: 108 QT Interval:  480 QTC Calculation: 425 R Axis:   46 Text Interpretation: Sinus bradycardia slower rate without other significant change from most recent. Confirmed by Marily Memos [phone]) on 05/27/2019 5:12:32 AM      ____________________________________________  RADIOLOGY  DG Chest 2 View  Result Date: 05/27/2019 CLINICAL DATA:  Central chest pain upon wakening. EXAM: CHEST - 2 VIEW COMPARISON:  03/30/2018 FINDINGS: Heart size is normal. Mild tortuosity of the aorta. The pulmonary vascularity is normal. The lungs are clear. No effusions. No acute bone finding. Pectus deformity of the chest. IMPRESSION: No active cardiopulmonary disease. Electronically Signed   By: Paulina Fusi M.D.   On: 05/27/2019 05:17   ____________________________________________   PROCEDURES  Procedure(s) performed:   Procedures   ____________________________________________   INITIAL IMPRESSION / ASSESSMENT AND PLAN / ED COURSE  Heart score of 5, concerning sounding story but is adamant he will not stay in hospital. First troponin negative, still cp free, plan for repeat troponin if still unremarkable, can be discharged with close cardiology follow up which is likely safe based on institutional protocols and moderate-low heart score.  However, if elevated, will need to reengage discussion about admission.  Second troponin ok. Will discharge.   Pertinent labs & imaging results that were available during my care of the patient were reviewed by me and considered in my medical decision making (see chart for details).  A medical screening exam was performed and I feel the patient has had an appropriate workup for their chief complaint at this time and likelihood of emergent condition existing is low. They have been counseled on decision, discharge, follow up and which symptoms necessitate immediate return to the emergency department. They or their family verbally stated understanding and agreement with plan and discharged in stable condition.   ____________________________________________  FINAL CLINICAL IMPRESSION(S) / ED DIAGNOSES  Final diagnoses:  Nonspecific chest pain  Bradycardia    MEDICATIONS GIVEN DURING THIS VISIT:  Medications  aspirin chewable tablet 162 mg (162 mg Oral Given 05/27/19 0533)     NEW OUTPATIENT MEDICATIONS STARTED DURING THIS VISIT:  Discharge Medication List as of 05/27/2019  7:04 AM      Note:  This note was prepared with assistance of Dragon voice recognition software. Occasional wrong-word or sound-a-like substitutions may have occurred due to the inherent limitations of voice recognition software.   Ellan Tess, Barbara Cower, MD 05/28/19 [phone]

## 2019-06-04 ENCOUNTER — Other Ambulatory Visit: Payer: Self-pay

## 2019-06-04 ENCOUNTER — Encounter: Payer: Self-pay | Admitting: Cardiology

## 2019-06-04 ENCOUNTER — Ambulatory Visit: Payer: Medicare Other | Admitting: Cardiology

## 2019-06-04 VITALS — BP 102/66 | HR 52 | Temp 97.8°F | Resp 16 | Ht 67.0 in | Wt 165.0 lb

## 2019-06-04 DIAGNOSIS — I2510 Atherosclerotic heart disease of native coronary artery without angina pectoris: Secondary | ICD-10-CM

## 2019-06-04 DIAGNOSIS — I480 Paroxysmal atrial fibrillation: Secondary | ICD-10-CM

## 2019-06-04 MED ORDER — NITROGLYCERIN 0.4 MG SL SUBL: 0.4000 mg | SUBLINGUAL_TABLET | SUBLINGUAL | 3 refills | Status: AC | PRN

## 2019-06-04 NOTE — Progress Notes (Signed)
Primary Physician/Referring:  Chilton Greathouse, MD  Patient ID: Stuart Flores, male    DOB: [birth year], 86 y.o.   MRN: [id number]  Chief Complaint  Patient presents with  . Atrial Fibrillation  . Coronary Artery Disease  . Follow-up    6 month  . Transitions Of Care   HPI:    Stuart Flores  is a 86 y.o.  Caucasian gentleman with h/o CAD, on 03/03/2015 underwent coronary angiography and stenting to subtotally occluded dominant circumflex coronary artery and mid LAD by implantation of 4.0 x 30 mm and 2.75 x 22 mm resolute DES respectively on 03/04/2015.  Past medical history significant for paroxysmal atrial fibrillation.  There is no history of hypertension or hyperlipidemia and chronic mild leg edema and uses support stockings.   He was seen in the emergency room for chest pain on 05/27/2019 and discharged home after ruling out for myocardial infarction.  Hence he made a office visit appointment also this is a 84-month visit.  He has had "minor" episodes of chest pain at night since evaluation in the ED.  He continues to walk for at least 2 miles every day briskly and remains asymptomatic.  Past Medical History:  Diagnosis Date  . Arthritis    ostearthritis- back, knee  . BPH with obstruction/lower urinary tract symptoms    urologist-  dr r. evans @[facility]   . Bradycardia   . Coronary artery disease    cardiologist-- dr  . History of glaucoma    per pt no issues since cataract extraction's bilaterally 2015  . History of squamous cell carcinoma excision    face x2  . PAF (paroxysmal atrial fibrillation) [facility])    cardiologist-- dr [facility]  . S/P drug eluting coronary stent placement 03/04/2015   DES x1 to mid CFx and DES x1 to mid LAD  . Wears glasses   . Wears hearing aid in both ears    Past Surgical History:  Procedure Laterality Date  . CARDIAC CATHETERIZATION N/A 03/04/2015   Procedure: Left Heart Cath and Coronary Angiography;  Surgeon: 03/06/2015, MD;  Location: [facility]  INVASIVE CV LAB;  Service: Cardiovascular;  Laterality: N/A;  . CARDIAC CATHETERIZATION  03/04/2015   Procedure: Coronary Stent Intervention;  Surgeon: 03/06/2015, MD;  Location: [facility] INVASIVE CV LAB;  Service: Cardiovascular;;  DES to mCFx and mLAD  . CARDIOVASCULAR STRESS TEST  12-27-2016   dr 12-29-2016   Low risk nuclear study w/ no ischemia/  normal LV function and wall function, ef 67%  . CATARACT EXTRACTION W/ INTRAOCULAR LENS  IMPLANT, BILATERAL Bilateral 2015  . EYE SURGERY     Bilateral cataracts x2  . HAND SURGERY Left early 2000s   "thumb surgery for Tendon pair"  . KNEE ARTHROSCOPY Bilateral   . LUMBAR LAMINECTOMY/DECOMPRESSION MICRODISCECTOMY Left 03/07/2019   Procedure: Left far lateral disectomy L4-5;  Surgeon: 14/05/2018, MD;  Location: [facility] OR;  Service: Orthopedics;  Laterality: Left;  2 hrs  . STERIOD INJECTION Left 11/24/2017   Procedure: LEFT KNEE CORTISONE INJECTION;  Surgeon: 11/26/2017, MD;  Location: [facility] ORS;  Service: Orthopedics;  Laterality: Left;  . TONSILLECTOMY  child  . TOTAL KNEE ARTHROPLASTY Right 10/27/2015   Procedure: TOTAL RIGHT KNEE ARTHROPLASTY;  Surgeon: 10/29/2015, MD;  Location: [facility] ORS;  Service: Orthopedics;  Laterality: Right;  . TOTAL KNEE ARTHROPLASTY Left 03/09/2018   Procedure: TOTAL KNEE ARTHROPLASTY;  Surgeon: 14/08/2017, MD;  Location: [facility] OR[facility];  Service: Orthopedics;  Laterality: Left;  70 mins with block  . TOTAL KNEE REVISION WITH SCAR DEBRIDEMENT/PATELLA REVISION WITH POLY EXCHANGE Right 11/24/2017   Procedure: RIGHT KNEE OPEN ARTHROTOMY WITH SCAR DEBRIDEMENT AND POSSIBLE POLY EXCHANGE;  Surgeon: Paralee Cancel, MD;  Location: [facility] ORS;  Service: Orthopedics;  Laterality: Right;  90 mins  . TRANSTHORACIC ECHOCARDIOGRAM  01-20-2017   dr Einar Gip   mild concentric LVH, ef 55%/  trace AR/  mild MR, TR and  PR   Family History  Problem Relation Age of Onset  . Heart attack Mother   . Arthritis/Rheumatoid Mother   . Pancreatic cancer Father   .  Other Brother        car accident    Social History   Tobacco Use  . Smoking status: Former Smoker    Packs/day: 1.00    Years: 9.00    Pack years: 9.00    Types: Cigarettes    Quit date: 12/04/1963    Years since quitting: 55.5  . Smokeless tobacco: Never Used  . Tobacco comment: stopped smoking cigarettes at age 80  Substance Use Topics  . Alcohol use: Yes    Alcohol/week: 9.0 standard drinks    Types: 9 Glasses of wine per week    Comment: wine or beer daily   ROS  Review of Systems  Cardiovascular: Positive for chest pain and leg swelling (chronic). Negative for dyspnea on exertion.  Musculoskeletal: Positive for arthritis.  Gastrointestinal: Negative for melena.   Objective  Blood pressure 102/66, pulse (!) 52, temperature 97.8 F (36.6 C), temperature source Temporal, resp. rate 16, height 5\' 7"  (1.702 m), weight 165 lb (74.8 kg), SpO2 100 %.  Vitals with BMI 06/04/2019 05/27/2019 05/27/2019  Height 5\' 7"  - 5\' 7"   Weight 165 lbs - 162 lbs 1 oz  BMI 77.41 - 28.78  Systolic 676 720 -  Diastolic 66 67 -  Pulse 52 53 -     Physical Exam  Constitutional: He appears well-developed and well-nourished. No distress.  Cardiovascular: Normal rate, regular rhythm, normal heart sounds and intact distal pulses. Exam reveals no gallop.  No murmur heard. Pulses:      Carotid pulses are 2+ on the right side with bruit and 2+ on the left side with bruit.      Radial pulses are 2+ on the right side and 2+ on the left side.       Popliteal pulses are 2+ on the right side and 2+ on the left side.       Dorsalis pedis pulses are 0 on the right side and 0 on the left side.       Posterior tibial pulses are 0 on the right side and 0 on the left side.  Loss of hair and varicose veins are present in bilateral lower extremity.  No skin breakdown. trace+ bilateral  Edema. No JVD  Pulmonary/Chest: Effort normal and breath sounds normal.  Abdominal: Soft. Bowel sounds are normal.   Laboratory  examination:   Recent Labs    03/05/19 1122 04/09/19 2118 05/27/19 0453  NA 140 132* 140  K 4.3 4.2 4.4  CL 106 98 105  CO2 28 26 25   GLUCOSE 81 103* 92  BUN 22 19 19   CREATININE 0.86 1.05 0.97  CALCIUM 8.9 8.6* 8.6*  GFRNONAA >60 >60 >60  GFRAA >60 >60 >60   estimated creatinine clearance is 53.9 mL/min (by C-G formula based on SCr of 0.97 mg/dL).  CMP Latest Ref Rng & Units 05/27/2019 04/09/2019 03/05/2019  Glucose 70 - 99 mg/dL 92 563(J) 81  BUN 8 - 23 mg/dL 19 19 22   Creatinine 0.61 - 1.24 mg/dL 4.97 0.26  Sodium 135 - 145 mmol/L 140 132(L) 140  Potassium 3.5 - 5.1 mmol/L 4.4 4.2 4.3  Chloride 98 - 111 mmol/L 105 98 106  CO2 22 - 32 mmol/L 25 26 28   Calcium 8.9 - 10.3 mg/dL 3.78) ) 8.9  Total Protein 6.5 - 8.1 g/dL - 6.0(L) -  Total Bilirubin 0.3 - 1.2 mg/dL - 0.8 -  Alkaline Phos 38 - 126 U/L - 41 -  AST 15 - 41 U/L - 30 -  ALT 0 - 44 U/L - 18 -   CBC Latest Ref Rng & Units 05/27/2019 04/09/2019 03/05/2019  WBC 4.0 - 10.5 K/uL 7.4 5.3 7.6  Hemoglobin 13.0 - 17.0 g/dL 12.0(L) 11.8(L) 12.4(L)  Hematocrit 39.0 - 52.0 % 37.9(L) 35.2(L) 38.9(L)  Platelets 150 - 400 K/uL 222 202 236   Lipid Panel  No results found for: CHOL, TRIG, HDL, CHOLHDL, VLDL, LDLCALC, LDLDIRECT HEMOGLOBIN A1C No results found for: HGBA1C, MPG TSH No results for input(s): TSH in the last 8760 hours.  External labs:   Cholesterol, total 138.000 m 12/01/2018 HDL 39 MG/DL 03/07/2019 LDL 12/03/2018 mg 12/01/2018 Triglycerides 61.000 12/01/2018  TSH 1.690 12/01/2019  Medications and allergies  No Known Allergies   Current Outpatient Medications  Medication Instructions  . apixaban (ELIQUIS) 5 mg, Oral, 2 times daily  . atorvastatin (LIPITOR) 40 mg, Oral, Daily, In the morning  . ferrous sulfate (FERROUSUL) 325 mg, Oral, Every M-W-F  . metoprolol succinate (TOPROL-XL) 25 MG 24 hr tablet TAKE 1/2 TABLET BY MOUTH EVERY DAY  . nitroGLYCERIN (NITROSTAT) 0.4 mg, Sublingual, Every 5 min PRN    Radiology:    DG Chest 2 View  Result Date: 05/27/2019 CLINICAL DATA:  Central chest pain upon wakening. EXAM: CHEST - 2 VIEW COMPARISON:  03/30/2018 FINDINGS: Heart size is normal. Mild tortuosity of the aorta. The pulmonary vascularity is normal. The lungs are clear. No effusions. No acute bone finding. Pectus deformity of the chest. IMPRESSION: No active cardiopulmonary disease. Electronically Signed   By: 05/29/2019 M.D.   On: 05/27/2019 05:17   Cardiac Studies:   Coronary angiogram 03/04/2015: Mid dominant CX 99% to 0% with 4x83mm Resolute DES. Mid LAD 80% to 0% with 2.75x57mm Resolute DES.  Exercise myoview stress 12/27/2016: 1. The patient performed treadmill exercise using a Bruce protocol, completing 5:00 minutes, achieving 6.89 METS, normal hemodynamic response. The patient did not develop symptoms other than fatigue during the procedure. 2. The stress electrocardiogram showed sinus tachycardia, normal stress conduction, no stress arrhythmias and normal stress repolarization. No ischemic changes. 3. The overall quality of the study is excellent. There is no evidence of abnormal lung activity. Stress and rest SPECT images demonstrate homogeneous tracer distribution throughout the myocardium. Gated SPECT imaging reveals normal myocardial thickening and wall motion. The left ventricular ejection fraction was normal (67%). This is a low risk study.  Echocardiogram 01/20/2017: Left ventricle cavity is normal in size. Mild concentric hypertrophy of the left ventricle. Normal global wall motion. Calculated EF 55%. Mild (Grade I) mitral regurgitation. Mild tricuspid regurgitation. Mild pulmonic regurgitation. No evidence of pulmonary   EKG 06/04/2019: Marked sinus bradycardia at rate of 47 bpm, borderline first-degree AV block.  Poor R wave progression, probably normal variant, cannot exclude anteroseptal infarct old.  Low-voltage complexes. No significant change from EKG  12/04/2018.  Assessment  ICD-10-CM   1. Coronary artery disease involving native coronary artery of native heart without angina pectoris  I25.10 EKG 12-Lead    nitroGLYCERIN (NITROSTAT) 0.4 MG SL tablet  2. Paroxysmal atrial fibrillation (HCC). CHA2DS2-VASc Score is 3.  Yearly risk of stroke: 3.2%. (Age and CAD)hypertension.  I48.0     Meds ordered this encounter  Medications  . nitroGLYCERIN (NITROSTAT) 0.4 MG SL tablet    Sig: Place 1 tablet (0.4 mg total) under the tongue every 5 (five) minutes as needed for chest pain (No more than 3 doses).    Dispense:  25 tablet    Refill:  3    Medications Discontinued During This Encounter  Medication Reason  . nitroGLYCERIN (NITROSTAT) 0.4 MG SL tablet Reorder     Recommendations:   Stuart Flores  is a 86 y.o.  Caucasian gentleman with h/o CAD, on 03/03/2015 underwent coronary angiography and stenting to subtotally occluded dominant circumflex coronary artery and mid LAD by implantation of 4.0 x 30 mm and 2.75 x 22 mm resolute DES respectively on 03/04/2015.  Past medical history significant for paroxysmal atrial fibrillation.  There is no history of hypertension or hyperlipidemia and chronic mild leg edema and uses support stockings.   He was seen in the emergency room for chest pain on 05/27/2019 and discharged home after ruling out for myocardial infarction. Chest pain symptoms only at night probably not anginal pain, he has been walking 2 miles daily without chest pain.  However I did refill his nitroglycerin and advised him how to use it.  We will continue observation for now.  I also advised him to use OTC PPI to see if symptoms would improve.  I reviewed his labs, lipids under excellent control, blood pressure is also very well controlled.  He does have chronic bradycardia but essentially asymptomatic.  No changes in the medications were done today.  Yates Decamp, MD, [facility] 06/04/2019, 2:55 PM [facility] [place] Office:  [phone]

## 2019-09-06 ENCOUNTER — Other Ambulatory Visit: Payer: Self-pay | Admitting: Cardiology

## 2019-10-11 ENCOUNTER — Telehealth: Payer: Self-pay | Admitting: *Deleted

## 2019-10-11 NOTE — Telephone Encounter (Signed)
Our office received a clearance request for this pt. I see that the pt is followed by Dr. Yates Decamp. I called the requesting office and informed they will need to reach out to Dr. Cletis Media office for clearance. Dr. Clemetine Marker office thanked me for the help.

## 2019-10-15 NOTE — Telephone Encounter (Signed)
Our office received another clearance request today. I called and left message for the dental office they need to reach out to Dr. Rosey Bath office for clearance. Pt is not followed by [facility].

## 2019-10-22 ENCOUNTER — Encounter: Payer: Self-pay | Admitting: Cardiology

## 2019-12-05 ENCOUNTER — Other Ambulatory Visit: Payer: Self-pay

## 2019-12-05 ENCOUNTER — Encounter: Payer: Self-pay | Admitting: Cardiology

## 2019-12-05 ENCOUNTER — Ambulatory Visit: Payer: Medicare Other | Admitting: Cardiology

## 2019-12-05 VITALS — BP 131/68 | HR 51 | Resp 17 | Ht 67.0 in | Wt 166.0 lb

## 2019-12-05 DIAGNOSIS — I2510 Atherosclerotic heart disease of native coronary artery without angina pectoris: Secondary | ICD-10-CM

## 2019-12-05 DIAGNOSIS — I480 Paroxysmal atrial fibrillation: Secondary | ICD-10-CM

## 2019-12-05 DIAGNOSIS — R001 Bradycardia, unspecified: Secondary | ICD-10-CM

## 2019-12-05 NOTE — Progress Notes (Signed)
Primary Physician/Referring:  Chilton Greathouse, MD  Patient ID: Stuart Flores, male    DOB: 1933/09/08, 86 y.o.   MRN: [id number]  Chief Complaint  Patient presents with  . Coronary Artery Disease  . Atrial Fibrillation  . Follow-up    6 MONTH   HPI:    Stuart Flores  is a 86 y.o.  Caucasian gentleman with h/o CAD, on 03/03/2015 underwent coronary angiography and stenting to subtotally occluded dominant circumflex coronary artery and mid LAD by implantation of 4.0 x 30 mm and 2.75 x 22 mm resolute DES respectively on 03/04/2015.  Past medical history significant for paroxysmal atrial fibrillation.  There is no history of hypertension or hyperlipidemia and chronic mild leg edema and uses support stockings.  He continues to walk for at least 2 miles every day briskly and remains asymptomatic. He does not think he has had any episodes of atrial fibrillation.   Past Medical History:  Diagnosis Date  . Arthritis    ostearthritis- back, knee  . BPH with obstruction/lower urinary tract symptoms    urologist-  dr r. evans @[facility]   . Bradycardia   . Coronary artery disease    cardiologist-- dr  . History of glaucoma    per pt no issues since cataract extraction's bilaterally 2015  . History of squamous cell carcinoma excision    face x2  . PAF (paroxysmal atrial fibrillation) [facility])    cardiologist-- dr [facility]  . S/P drug eluting coronary stent placement 03/04/2015   DES x1 to mid CFx and DES x1 to mid LAD  . Wears glasses   . Wears hearing aid in both ears    Past Surgical History:  Procedure Laterality Date  . CARDIAC CATHETERIZATION N/A 03/04/2015   Procedure: Left Heart Cath and Coronary Angiography;  Surgeon: 03/06/2015, MD;  Location: [facility] INVASIVE CV LAB;  Service: Cardiovascular;  Laterality: N/A;  . CARDIAC CATHETERIZATION  03/04/2015   Procedure: Coronary Stent Intervention;  Surgeon: 03/06/2015, MD;  Location: [facility] INVASIVE CV LAB;  Service: Cardiovascular;;  DES to  mCFx and mLAD  . CARDIOVASCULAR STRESS TEST  12-27-2016   dr 12-29-2016   Low risk nuclear study w/ no ischemia/  normal LV function and wall function, ef 67%  . CATARACT EXTRACTION W/ INTRAOCULAR LENS  IMPLANT, BILATERAL Bilateral 2015  . EYE SURGERY     Bilateral cataracts x2  . HAND SURGERY Left early 2000s   "thumb surgery for Tendon pair"  . KNEE ARTHROSCOPY Bilateral   . LUMBAR LAMINECTOMY/DECOMPRESSION MICRODISCECTOMY Left 03/07/2019   Procedure: Left far lateral disectomy L4-5;  Surgeon: 14/05/2018, MD;  Location: [facility] OR;  Service: Orthopedics;  Laterality: Left;  2 hrs  . STERIOD INJECTION Left 11/24/2017   Procedure: LEFT KNEE CORTISONE INJECTION;  Surgeon: 11/26/2017, MD;  Location: [facility] ORS;  Service: Orthopedics;  Laterality: Left;  . TONSILLECTOMY  child  . TOTAL KNEE ARTHROPLASTY Right 10/27/2015   Procedure: TOTAL RIGHT KNEE ARTHROPLASTY;  Surgeon: 10/29/2015, MD;  Location: [facility] ORS;  Service: Orthopedics;  Laterality: Right;  . TOTAL KNEE ARTHROPLASTY Left 03/09/2018   Procedure: TOTAL KNEE ARTHROPLASTY;  Surgeon: 14/08/2017, MD;  Location: [facility] ORS;  Service: Orthopedics;  Laterality: Left;  70 mins with block  . TOTAL KNEE REVISION WITH SCAR DEBRIDEMENT/PATELLA REVISION WITH POLY EXCHANGE Right 11/24/2017   Procedure: RIGHT KNEE OPEN ARTHROTOMY WITH SCAR DEBRIDEMENT AND POSSIBLE POLY EXCHANGE;  Surgeon: 11/26/2017, MD;  Location: [facility] ORS;  Service: Orthopedics;  Laterality: Right;  90 mins  . TRANSTHORACIC ECHOCARDIOGRAM  01-20-2017   dr Jacinto Halim   mild concentric LVH, ef 55%/  trace AR/  mild MR, TR and  PR   Family History  Problem Relation Age of Onset  . Heart attack Mother   . Arthritis/Rheumatoid Mother   . Pancreatic cancer Father   . Other Brother        car accident    Social History   Tobacco Use  . Smoking status: Former Smoker    Packs/day: 1.00    Years: 9.00    Pack years: 9.00    Types: Cigarettes    Quit date: 12/04/1963    Years since quitting:  56.0  . Smokeless tobacco: Never Used  . Tobacco comment: stopped smoking cigarettes at age 54  Substance Use Topics  . Alcohol use: Yes    Alcohol/week: 9.0 standard drinks    Types: 9 Glasses of wine per week    Comment: wine or beer daily   ROS  Review of Systems  Cardiovascular: Positive for leg swelling (chronic). Negative for chest pain and dyspnea on exertion.  Musculoskeletal: Positive for arthritis.  Gastrointestinal: Negative for melena.   Objective  Blood pressure 131/68, pulse (!) 51, resp. rate 17, height 5\' 7"  (1.702 m), weight 166 lb (75.3 kg), SpO2 97 %.  Vitals with BMI 12/05/2019 06/04/2019 05/27/2019  Height 5\' 7"  5\' 7"  -  Weight 166 lbs 165 lbs -  BMI 25.99 25.84 -  Systolic 131 102 05/29/2019  Diastolic 68 66 67  Pulse 51 52 53     Physical Exam Constitutional:      General: He is not in acute distress.    Appearance: He is well-developed.  Cardiovascular:     Rate and Rhythm: Normal rate and regular rhythm.     Pulses: Intact distal pulses.          Carotid pulses are 2+ on the right side with bruit and 2+ on the left side with bruit.      Radial pulses are 2+ on the right side and 2+ on the left side.       Popliteal pulses are 2+ on the right side and 2+ on the left side.       Dorsalis pedis pulses are 0 on the right side and 0 on the left side.       Posterior tibial pulses are 0 on the right side and 0 on the left side.     Heart sounds: Normal heart sounds. No murmur heard.  No gallop.      Comments: Loss of hair and varicose veins are present in bilateral lower extremity.  No skin breakdown. trace+ bilateral  Edema. No JVD Pulmonary:     Effort: Pulmonary effort is normal.     Breath sounds: Normal breath sounds.  Abdominal:     General: Bowel sounds are normal.     Palpations: Abdomen is soft.    Laboratory examination:   Recent Labs    03/05/19 1122 04/09/19 2118 05/27/19 0453  NA 140 132* 140  K 4.3 4.2 4.4  CL 106 98 105  CO2 28 26 25    GLUCOSE 81 103* 92  BUN 22 19 19   CREATININE 0.86 1.05 0.97  CALCIUM 8.9 8.6* 8.6*  GFRNONAA >60 >60 >60  GFRAA >60 >60 >60   CrCl cannot be calculated (Patient's most recent lab result is older than the maximum 21 days allowed.).  CMP Latest Ref  Rng & Units 05/27/2019 04/09/2019 03/05/2019  Glucose 70 - 99 mg/dL 92 409(W103(H) 81  BUN 8 - 23 mg/dL 19 19 22   Creatinine 0.61 - 1.24 mg/dL 1.190.97 1.471.05 8.290.86  Sodium 135 - 145 mmol/L 140 132(L) 140  Potassium 3.5 - 5.1 mmol/L 4.4 4.2 4.3  Chloride 98 - 111 mmol/L 105 98 106  CO2 22 - 32 mmol/L 25 26 28   Calcium 8.9 - 10.3 mg/dL 5.6(O8.6(L) 1.3(Y8.6(L) 8.9  Total Protein 6.5 - 8.1 g/dL - 6.0(L) -  Total Bilirubin 0.3 - 1.2 mg/dL - 0.8 -  Alkaline Phos 38 - 126 U/L - 41 -  AST 15 - 41 U/L - 30 -  ALT 0 - 44 U/L - 18 -   CBC Latest Ref Rng & Units 05/27/2019 04/09/2019 03/05/2019  WBC 4.0 - 10.5 K/uL 7.4 5.3 7.6  Hemoglobin 13.0 - 17.0 g/dL 12.0(L) 11.8(L) 12.4(L)  Hematocrit 39 - 52 % 37.9(L) 35.2(L) 38.9(L)  Platelets 150 - 400 K/uL 222 202 236   Lipid Panel  No results found for: CHOL, TRIG, HDL, CHOLHDL, VLDL, LDLCALC, LDLDIRECT HEMOGLOBIN A1C No results found for: HGBA1C, MPG TSH No results for input(s): TSH in the last 8760 hours.  External labs:   Hemoglobin 12.000 05/27/2019 Platelets 222.000 05/27/2019  Creatinine, Serum 0.970 05/27/2019 Potassium 4.400 05/27/2019 ALT (SGPT) 18.000 04/09/2019  TSH 1.690 12/01/2019  Cholesterol, total 138.000 m 12/01/2018 HDL 39 MG/DL 8/65/78468/28/2020 LDL 96.29587.000 mg 12/01/2018 Triglycerides 61.000 12/01/2018  Medications and allergies  No Known Allergies   Current Outpatient Medications  Medication Instructions  . apixaban (ELIQUIS) 5 mg, Oral, 2 times daily  . atorvastatin (LIPITOR) 40 mg, Oral, Daily, In the morning  . ferrous sulfate 325 mg, Oral, Daily with breakfast  . metoprolol succinate (TOPROL-XL) 12.5 mg, Oral, Daily  . Multiple Vitamin (MULTIVITAMIN) tablet 1 tablet, Oral, Daily  . nitroGLYCERIN  (NITROSTAT) 0.4 mg, Sublingual, Every 5 min PRN  . Probiotic Product (PROBIOTIC ADVANCED PO) 1 tablet, Oral, Daily   Radiology:    DG Chest 2 View  Result Date: 05/27/2019 CLINICAL DATA:  Central chest pain upon wakening. EXAM: CHEST - 2 VIEW COMPARISON:  03/30/2018 FINDINGS: Heart size is normal. Mild tortuosity of the aorta. The pulmonary vascularity is normal. The lungs are clear. No effusions. No acute bone finding. Pectus deformity of the chest. IMPRESSION: No active cardiopulmonary disease. Electronically Signed   By: Paulina FusiMark  Shogry M.D.   On: 05/27/2019 05:17   Cardiac Studies:   Coronary angiogram 03/04/2015: Mid dominant CX 99% to 0% with 4x3530mm Resolute DES. Mid LAD 80% to 0% with 2.75x10222mm Resolute DES.  Exercise myoview stress 12/27/2016: 1. The patient performed treadmill exercise using a Bruce protocol, completing 5:00 minutes, achieving 6.89 METS, normal hemodynamic response. The patient did not develop symptoms other than fatigue during the procedure. 2. The stress electrocardiogram showed sinus tachycardia, normal stress conduction, no stress arrhythmias and normal stress repolarization. No ischemic changes. 3. The overall quality of the study is excellent. There is no evidence of abnormal lung activity. Stress and rest SPECT images demonstrate homogeneous tracer distribution throughout the myocardium. Gated SPECT imaging reveals normal myocardial thickening and wall motion. The left ventricular ejection fraction was normal (67%). This is a low risk study.  Echocardiogram 01/20/2017: Left ventricle cavity is normal in size. Mild concentric hypertrophy of the left ventricle. Normal global wall motion. Calculated EF 55%. Mild (Grade I) mitral regurgitation. Mild tricuspid regurgitation. Mild pulmonic regurgitation. No evidence of pulmonary   EKG:  EKG 12/05/2019: Sinus bradycardia at rate of 46 bpm, normal axis, poor R progression, probably normal variant.  Low-voltage  complexes, no evident ischemia, normal QT interval.   Possible pulmonary disease. No significant change from EKG 06/04/2019: Marked sinus bradycardia at rate of 47 bpm, No significant change from 12/04/2018.  Assessment     ICD-10-CM   1. Coronary artery disease involving native coronary artery of native heart without angina pectoris  I25.10 EKG 12-Lead  2. Paroxysmal atrial fibrillation (HCC). CHA2DS2-VASc Score is 3.  Yearly risk of stroke: 3.2%. (Age and CAD)hypertension.  I48.0   3. Bradycardia by electrocardiogram  R00.1     CHA2DS2-VASc Score is 3.  Yearly risk of stroke: 3.2% (A, CAD).  Score of 1=0.6; 2=2.2; 3=3.2; 4=4.8; 5=7.2; 6=9.8; 7=>9.8) -(CHF; HTN; vasc disease DM,  Male = 1; Age <65 =0; 65-74 = 1,  >75 =2; stroke/embolism= 2).    No orders of the defined types were placed in this encounter.   Medications Discontinued During This Encounter  Medication Reason  . ferrous sulfate (FERROUSUL) 325 (65 FE) MG tablet Patient Preference    Recommendations:   RHYKER SILVERSMITH  is a  86 y.o.  Caucasian gentleman with h/o CAD, on 03/03/2015 underwent coronary angiography and stenting to subtotally occluded dominant circumflex coronary artery and mid LAD by implantation of 4.0 x 30 mm and 2.75 x 22 mm resolute DES respectively on 03/04/2015.  Past medical history significant for paroxysmal atrial fibrillation.  There is no history of hypertension or hyperlipidemia and chronic mild leg edema and uses support stockings.   He is presently asymptomatic and does not think that he has had any more episodes of atrial fibrillation.  He has not had any recurrence of angina pectoris.  He is tolerating anticoagulation without any bleeding diathesis.  I reviewed his external labs, CBC has remained stable along with stable renal function.  Continue present medications without change.  His lipids are also well controlled.  With regard to underlying marked sinus bradycardia, he remains asymptomatic and  this is unchanged from prior EKG as well.  Hypertension is well controlled.  I will see him back in 6 months for follow-up.   Yates Decamp, MD, [facility] 12/05/2019, 8:44 PM Office: [phone]

## 2020-03-07 ENCOUNTER — Other Ambulatory Visit: Payer: Self-pay | Admitting: Cardiology

## 2020-03-07 DIAGNOSIS — E7800 Pure hypercholesterolemia, unspecified: Secondary | ICD-10-CM

## 2020-06-06 ENCOUNTER — Other Ambulatory Visit: Payer: Self-pay

## 2020-06-06 ENCOUNTER — Encounter: Payer: Self-pay | Admitting: Cardiology

## 2020-06-06 ENCOUNTER — Ambulatory Visit: Payer: Medicare Other | Admitting: Cardiology

## 2020-06-06 VITALS — BP 108/67 | HR 53 | Temp 97.6°F | Resp 17 | Ht 67.0 in | Wt 165.4 lb

## 2020-06-06 DIAGNOSIS — I480 Paroxysmal atrial fibrillation: Secondary | ICD-10-CM

## 2020-06-06 DIAGNOSIS — R001 Bradycardia, unspecified: Secondary | ICD-10-CM

## 2020-06-06 DIAGNOSIS — I2510 Atherosclerotic heart disease of native coronary artery without angina pectoris: Secondary | ICD-10-CM

## 2020-06-06 NOTE — Progress Notes (Signed)
Primary Physician/Referring:  Chilton Greathouse, MD  Patient ID: Stuart Flores, male    DOB: Sep 28, 1933, 87 y.o.   MRN: [id number]  Chief Complaint  Patient presents with  . Coronary Artery Disease  . Atrial Fibrillation    6 month   HPI:    Stuart Flores  is a 87 y.o.  Caucasian gentleman with h/o CAD, on 03/03/2015 underwent coronary angiography and stenting to subtotally occluded dominant circumflex coronary artery and mid LAD by implantation of 4.0 x 30 mm and 2.75 x 22 mm resolute DES respectively on 03/04/2015.  Past medical history significant for paroxysmal atrial fibrillation.    There is no history of hypertension or hyperlipidemia and chronic mild leg edema and uses support stockings.  He continues to walk for at least 2 miles every day briskly and remains asymptomatic. He does not think he has had any episodes of atrial fibrillation.  About 2 weeks ago he was trying to lift heavy object with his son and sprained his back but this is now getting better.  Otherwise no other symptoms.  Past Medical History:  Diagnosis Date  . Arthritis    ostearthritis- back, knee  . BPH with obstruction/lower urinary tract symptoms    urologist-  dr r. evans @[facility]   . Bradycardia   . Coronary artery disease    cardiologist-- dr  . History of glaucoma    per pt no issues since cataract extraction's bilaterally 2015  . History of squamous cell carcinoma excision    face x2  . PAF (paroxysmal atrial fibrillation) [facility])    cardiologist-- dr [facility]  . S/P drug eluting coronary stent placement 03/04/2015   DES x1 to mid CFx and DES x1 to mid LAD  . Wears glasses   . Wears hearing aid in both ears    Past Surgical History:  Procedure Laterality Date  . CARDIAC CATHETERIZATION N/A 03/04/2015   Procedure: Left Heart Cath and Coronary Angiography;  Surgeon: 03/06/2015, MD;  Location: [facility] INVASIVE CV LAB;  Service: Cardiovascular;  Laterality: N/A;  . CARDIAC CATHETERIZATION   03/04/2015   Procedure: Coronary Stent Intervention;  Surgeon: 03/06/2015, MD;  Location: [facility] INVASIVE CV LAB;  Service: Cardiovascular;;  DES to mCFx and mLAD  . CARDIOVASCULAR STRESS TEST  12-27-2016   dr 12-29-2016   Low risk nuclear study w/ no ischemia/  normal LV function and wall function, ef 67%  . CATARACT EXTRACTION W/ INTRAOCULAR LENS  IMPLANT, BILATERAL Bilateral 2015  . EYE SURGERY     Bilateral cataracts x2  . HAND SURGERY Left early 2000s   "thumb surgery for Tendon pair"  . KNEE ARTHROSCOPY Bilateral   . LUMBAR LAMINECTOMY/DECOMPRESSION MICRODISCECTOMY Left 03/07/2019   Procedure: Left far lateral disectomy L4-5;  Surgeon: 14/05/2018, MD;  Location: [facility] OR;  Service: Orthopedics;  Laterality: Left;  2 hrs  . STERIOD INJECTION Left 11/24/2017   Procedure: LEFT KNEE CORTISONE INJECTION;  Surgeon: 11/26/2017, MD;  Location: [facility] ORS;  Service: Orthopedics;  Laterality: Left;  . TONSILLECTOMY  child  . TOTAL KNEE ARTHROPLASTY Right 10/27/2015   Procedure: TOTAL RIGHT KNEE ARTHROPLASTY;  Surgeon: 10/29/2015, MD;  Location: [facility] ORS;  Service: Orthopedics;  Laterality: Right;  . TOTAL KNEE ARTHROPLASTY Left 03/09/2018   Procedure: TOTAL KNEE ARTHROPLASTY;  Surgeon: 14/08/2017, MD;  Location: [facility] ORS;  Service: Orthopedics;  Laterality: Left;  70 mins with block  . TOTAL KNEE REVISION WITH SCAR DEBRIDEMENT/PATELLA REVISION WITH POLY EXCHANGE  Right 11/24/2017   Procedure: RIGHT KNEE OPEN ARTHROTOMY WITH SCAR DEBRIDEMENT AND POSSIBLE POLY EXCHANGE;  Surgeon: Durene Romanslin, Matthew, MD;  Location: [facility] ORS;  Service: Orthopedics;  Laterality: Right;  90 mins  . TRANSTHORACIC ECHOCARDIOGRAM  01-20-2017   dr Jacinto Halimganji   mild concentric LVH, ef 55%/  trace AR/  mild MR, TR and  PR   Family History  Problem Relation Age of Onset  . Heart attack Mother   . Arthritis/Rheumatoid Mother   . Pancreatic cancer Father   . Other Brother        car accident    Social History   Tobacco Use  . Smoking status:  Former Smoker    Packs/day: 1.00    Years: 9.00    Pack years: 9.00    Types: Cigarettes    Quit date: 12/04/1963    Years since quitting: 56.5  . Smokeless tobacco: Never Used  . Tobacco comment: stopped smoking cigarettes at age [age]  Substance Use Topics  . Alcohol use: Yes    Alcohol/week: 9.0 standard drinks    Types: 9 Glasses of wine per week    Comment: wine or beer daily   ROS  Review of Systems  Cardiovascular: Positive for leg swelling (chronic). Negative for chest pain and dyspnea on exertion.  Musculoskeletal: Positive for arthritis.  Gastrointestinal: Negative for melena.   Objective  Blood pressure 108/67, pulse (!) 53, temperature 97.6 F (36.4 C), temperature source Temporal, resp. rate 17, height 5\' 7"  (1.702 m), weight 165 lb 6.4 oz (75 kg), SpO2 98 %.  Vitals with BMI 06/06/2020 12/05/2019 06/04/2019  Height 5\' 7"  5\' 7"  5\' 7"   Weight 165 lbs 6 oz 166 lbs 165 lbs  BMI 25.9 25.99 25.84  Systolic 108 131 161102  Diastolic 67 68 66  Pulse 53 51 52     Physical Exam Constitutional:      General: He is not in acute distress.    Appearance: He is well-developed.  Cardiovascular:     Rate and Rhythm: Normal rate and regular rhythm.     Pulses: Intact distal pulses.          Carotid pulses are 2+ on the right side with bruit and 2+ on the left side with bruit.      Radial pulses are 2+ on the right side and 2+ on the left side.       Popliteal pulses are 2+ on the right side and 2+ on the left side.       Dorsalis pedis pulses are 0 on the right side and 0 on the left side.       Posterior tibial pulses are 0 on the right side and 0 on the left side.     Heart sounds: Normal heart sounds. No murmur heard. No gallop.      Comments: Loss of hair and varicose veins are present in bilateral lower extremity.  No skin breakdown. trace+ bilateral  Edema. No JVD Pulmonary:     Effort: Pulmonary effort is normal.     Breath sounds: Normal breath sounds.  Abdominal:      General: Bowel sounds are normal.     Palpations: Abdomen is soft.    Laboratory examination:   No results for input(s): NA, K, CL, CO2, GLUCOSE, BUN, CREATININE, CALCIUM, GFRNONAA, GFRAA in the last 8760 hours. CrCl cannot be calculated (Patient's most recent lab result is older than the maximum 21 days allowed.).  CMP Latest Ref Rng &  Units 05/27/2019 04/09/2019 03/05/2019  Glucose 70 - 99 mg/dL 92 829(F) 81  BUN 8 - 23 mg/dL 19 19 22   Creatinine 0.61 - 1.24 mg/dL 6.21 3.08  Sodium 135 - 145 mmol/L 140 132(L) 140  Potassium 3.5 - 5.1 mmol/L 4.4 4.2 4.3  Chloride 98 - 111 mmol/L 105 98 106  CO2 22 - 32 mmol/L 25 26 28   Calcium 8.9 - 10.3 mg/dL 6.57) ) 8.9  Total Protein 6.5 - 8.1 g/dL - 6.0(L) -  Total Bilirubin 0.3 - 1.2 mg/dL - 0.8 -  Alkaline Phos 38 - 126 U/L - 41 -  AST 15 - 41 U/L - 30 -  ALT 0 - 44 U/L - 18 -   CBC Latest Ref Rng & Units 05/27/2019 04/09/2019 03/05/2019  WBC 4.0 - 10.5 K/uL 7.4 5.3 7.6  Hemoglobin 13.0 - 17.0 g/dL 12.0(L) 11.8(L) 12.4(L)  Hematocrit 39.0 - 52.0 % 37.9(L) 35.2(L) 38.9(L)  Platelets 150 - 400 K/uL 222 202 236   External labs:    Cholesterol, total 116.000 m 12/20/2019 HDL 36.000 mg 12/20/2019 LDL 66.000 mg 12/20/2019 Triglycerides 69.000 mg 12/20/2019  Hemoglobin 12.000 g/d 05/27/2019  Creatinine, Serum 0.970 mg/ 05/27/2019 Potassium 4.700 mEq 12/20/2019 ALT (SGPT) 19.000 IU/ 12/20/2019  TSH 1.650 12/20/2019  Hemoglobin 12.000 05/27/2019 Platelets 222.000 05/27/2019  Creatinine, Serum 0.970 05/27/2019 Potassium 4.400 05/27/2019 ALT (SGPT) 18.000 04/09/2019  TSH 1.690 12/01/2019  Cholesterol, total 138.000 m 12/01/2018 HDL 39 MG/DL 12/03/2019 LDL 12/03/2018 mg 12/01/2018 Triglycerides 61.000 12/01/2018  Medications and allergies  No Known Allergies   Current Outpatient Medications on File Prior to Visit  Medication Sig Dispense Refill  . apixaban (ELIQUIS) 5 MG TABS tablet Take 1 tablet (5 mg total) by mouth 2 (two) times daily. 180  tablet 3  . atorvastatin (LIPITOR) 40 MG tablet TAKE 1 TABLET (40 MG TOTAL) BY MOUTH DAILY. IN THE MORNING 30 tablet 6  . metoprolol succinate (TOPROL-XL) 25 MG 24 hr tablet Take 0.5 tablets (12.5 mg total) by mouth daily. 90 tablet 1  . Multiple Vitamin (MULTIVITAMIN) tablet Take 1 tablet by mouth daily.    . nitroGLYCERIN (NITROSTAT) 0.4 MG SL tablet Place 1 tablet (0.4 mg total) under the tongue every 5 (five) minutes as needed for chest pain (No more than 3 doses). 25 tablet 3  . Probiotic Product (PROBIOTIC ADVANCED PO) Take 1 tablet by mouth daily.     No current facility-administered medications on file prior to visit.    Radiology:    DG Chest 2 View Result Date: 05/27/2019 CLINICAL DATA:  Central chest pain upon wakening. EXAM: CHEST - 2 VIEW COMPARISON:  03/30/2018 FINDINGS: Heart size is normal. Mild tortuosity of the aorta. The pulmonary vascularity is normal. The lungs are clear. No effusions. No acute bone finding. Pectus deformity of the chest. IMPRESSION: No active cardiopulmonary disease. Electronically Signed   By: 05/29/2019 M.D.   On: 05/27/2019 05:17   Cardiac Studies:   Coronary angiogram 03/04/2015: Mid dominant CX 99% to 0% with 4x32mm Resolute DES. Mid LAD 80% to 0% with 2.75x88mm Resolute DES.  Exercise myoview stress 12/27/2016: 1. The patient performed treadmill exercise using a Bruce protocol, completing 5:00 minutes, achieving 6.89 METS, normal hemodynamic response. The patient did not develop symptoms other than fatigue during the procedure. 2. The stress electrocardiogram showed sinus tachycardia, normal stress conduction, no stress arrhythmias and normal stress repolarization. No ischemic changes. 3. The overall quality of the study is excellent. There is no evidence  of abnormal lung activity. Stress and rest SPECT images demonstrate homogeneous tracer distribution throughout the myocardium. Gated SPECT imaging reveals normal myocardial thickening and wall  motion. The left ventricular ejection fraction was normal (67%). This is a low risk study.  Echocardiogram 01/20/2017: Left ventricle cavity is normal in size. Mild concentric hypertrophy of the left ventricle. Normal global wall motion. Calculated EF 55%. Mild (Grade I) mitral regurgitation. Mild tricuspid regurgitation. Mild pulmonic regurgitation. No evidence of pulmonary   EKG:  EKG 06/06/2020: Sinus bradycardia at rate of 49 bpm, otherwise normal EKG.   No significant change from EKG 12/05/2019: Sinus bradycardia at rate of 46 bpm,. 06/04/2019: Marked sinus bradycardia at rate of 47 bpm, No significant change from 12/04/2018.  Assessment     ICD-10-CM   1. Coronary artery disease involving native coronary artery of native heart without angina pectoris  I25.10   2. Paroxysmal atrial fibrillation (HCC). CHA2DS2-VASc Score is 3.  Yearly risk of stroke: 3.2%. (Age and CAD)hypertension.  I48.0 EKG 12-Lead  3. Bradycardia by electrocardiogram  R00.1     CHA2DS2-VASc Score is 3.  Yearly risk of stroke: 3.2% (A, CAD).  Score of 1=0.6; 2=2.2; 3=3.2; 4=4.8; 5=7.2; 6=9.8; 7=>9.8) -(CHF; HTN; vasc disease DM,  Male = 1; Age <65 =0; 65-74 = 1,  >75 =2; stroke/embolism= 2).    No orders of the defined types were placed in this encounter.   Medications Discontinued During This Encounter  Medication Reason  . ferrous sulfate 325 (65 FE) MG tablet Error    Recommendations:   NASHAWN HILLOCK  is a  87 y.o.  Caucasian gentleman with h/o CAD, on 03/03/2015 underwent coronary angiography and stenting to subtotally occluded dominant circumflex coronary artery and mid LAD by implantation of 4.0 x 30 mm and 2.75 x 22 mm resolute DES respectively on 03/04/2015.  Past medical history significant for paroxysmal atrial fibrillation.  There is no history of hypertension or hyperlipidemia and chronic mild leg edema and uses support stockings.   He is presently asymptomatic and does not think that he has had  any more episodes of atrial fibrillation.  He has not had any recurrence of angina pectoris.  He is tolerating anticoagulation without any bleeding diathesis.  I reviewed his external labs, CBC has remained stable along with stable renal function.  Continue present medications without change.  His lipids are also well controlled.  With regard to underlying marked sinus bradycardia, he remains asymptomatic and this is unchanged from prior EKG as well.  Hypertension is well controlled.  I will see him back in 6 months for follow-up.   Yates Decamp, MD, [facility] 06/06/2020, 2:38 PM Office: [phone]

## 2020-06-25 ENCOUNTER — Other Ambulatory Visit: Payer: Self-pay | Admitting: Cardiology

## 2020-09-05 IMAGING — MR MR HEAD W/O CM
10 series · 45 of 48 positions shown · IV contrast (Yes)
Comparison: CT head without contrast 03/30/2018

CLINICAL DATA: Progressive confusion since yesterday morning.
Altered level of consciousness.

EXAM:
MRI HEAD WITHOUT CONTRAST
TECHNIQUE: Multiplanar, multiecho pulse sequences of the brain and surrounding
structures were obtained without intravenous contrast.

[Series 3: T1 · sagittal · 5.0mm · 0.47mm/px · 3 of 26 slices shown]
[im 1/26]
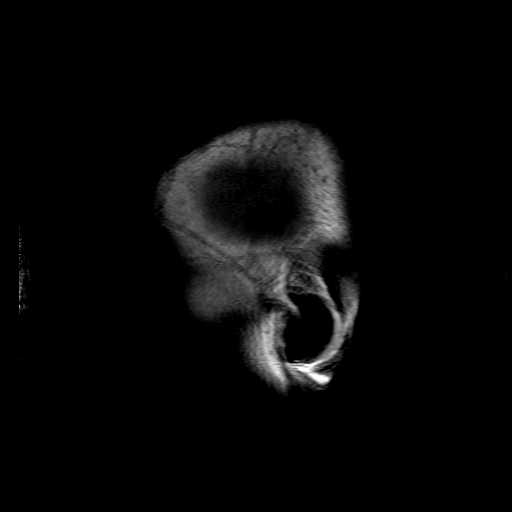
[im 13/26]
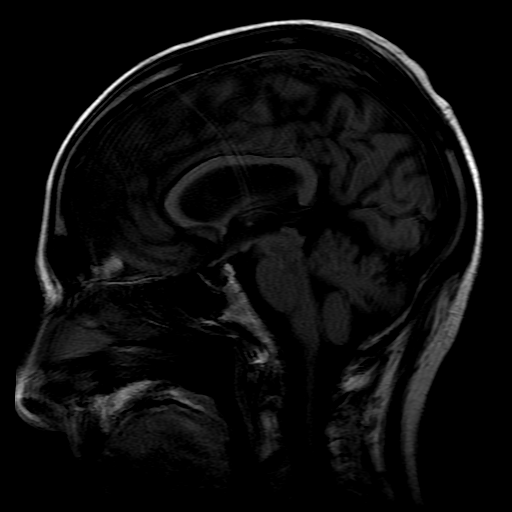
[im 26/26]
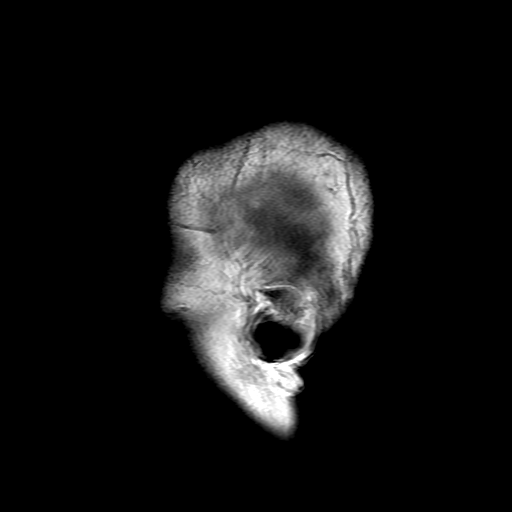

[Series 4: DWI · axial · 3.0mm · 1.09mm/px · z∈[-57,+94]mm · 9 of 104 slices shown (1 of 4)]
[im 1/104]
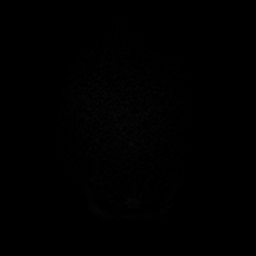
[im 13/104]
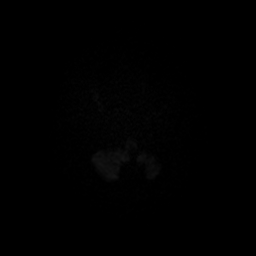
[im 26/104]
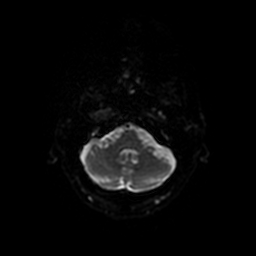
[im 39/104]
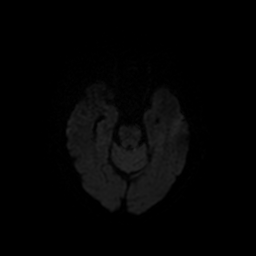
[im 52/104]
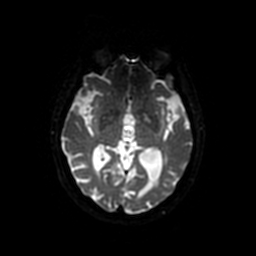
[im 65/104]
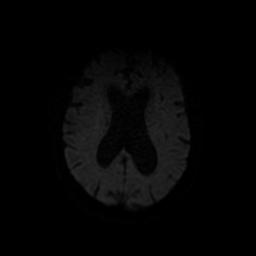
[im 78/104]
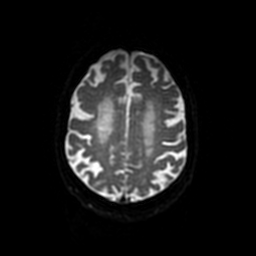
[im 91/104]
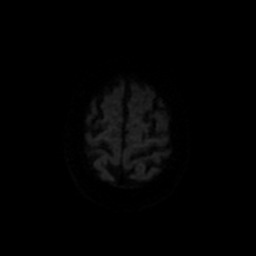
[im 104/104]
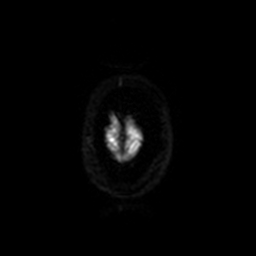

[Series 5: DWI · coronal · 3.0mm · 1.09mm/px · 11 of 126 slices shown (2 of 4)]
[im 1/126]
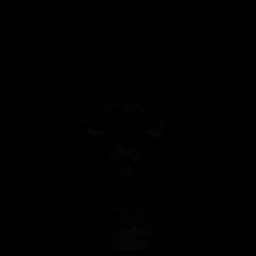
[im 13/126]
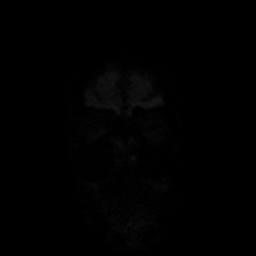
[im 26/126]
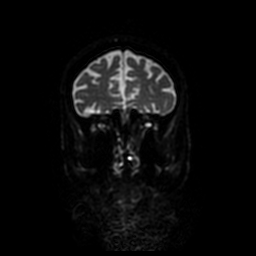
[im 38/126]
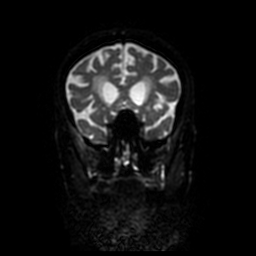
[im 51/126]
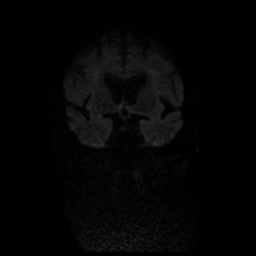
[im 63/126]
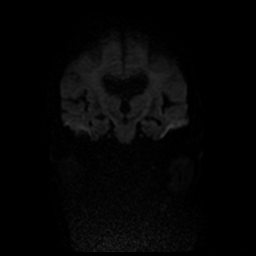
[im 76/126]
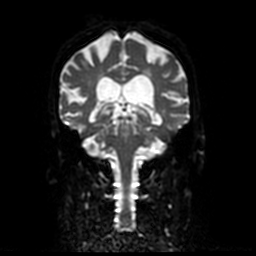
[im 88/126]
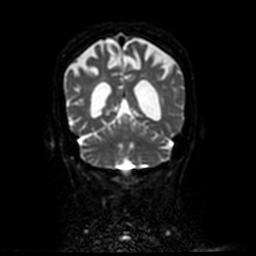
[im 101/126]
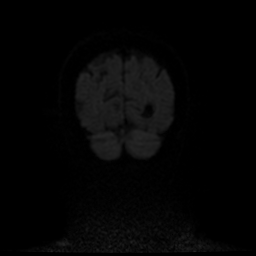
[im 113/126]
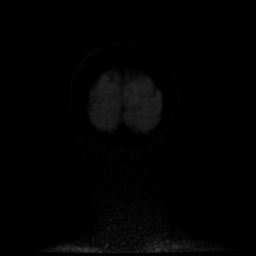
[im 126/126]
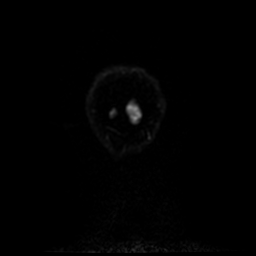

[Series 6: T2 · coronal · 5.0mm · 0.90mm/px · 3 of 30 slices shown (1 of 2)]
[im 1/30]
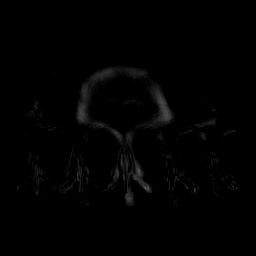
[im 15/30]
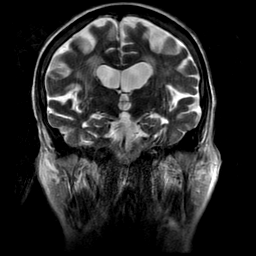
[im 30/30]
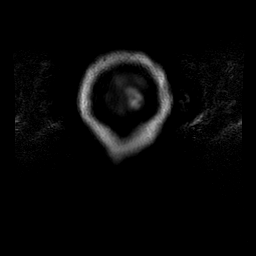

[Series 7: T2 · axial · 5.0mm · 0.86mm/px · z∈[-52,+97]mm · 2 of 26 slices shown (2 of 2)]
[im 1/26]
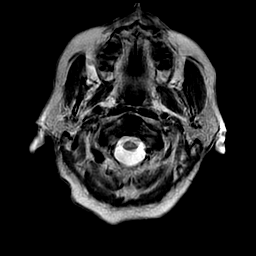
[im 26/26]
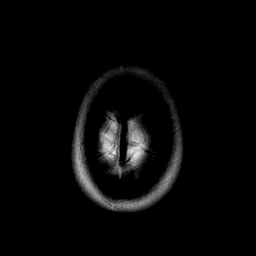

[Series 8: FLAIR · axial · 5.0mm · 0.86mm/px · z∈[-52,+97]mm · 2 of 26 slices shown]
[im 1/26]
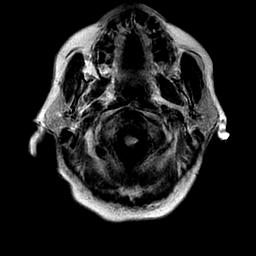
[im 26/26]
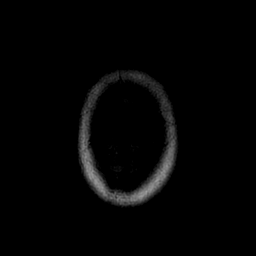

[Series 9: ax mpgr · axial · 5.0mm · 0.43mm/px · z∈[-54,+99]mm · 2 of 23 slices shown]
[im 1/23]
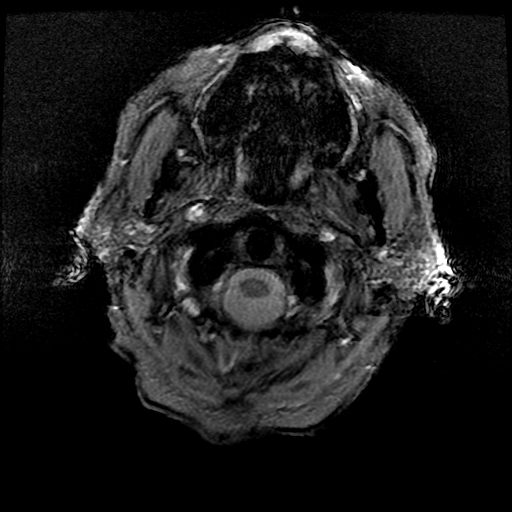
[im 23/23]
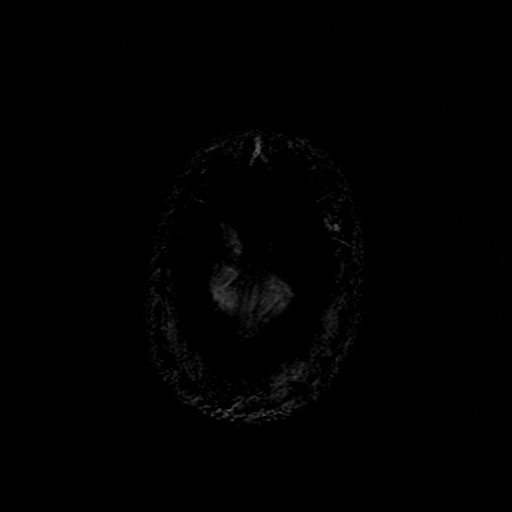

[Series 10: ax fspgr irp · axial · 3.0mm · 0.47mm/px · z∈[-55,-19]mm · 2 of 52 slices shown]
[im 1/52]
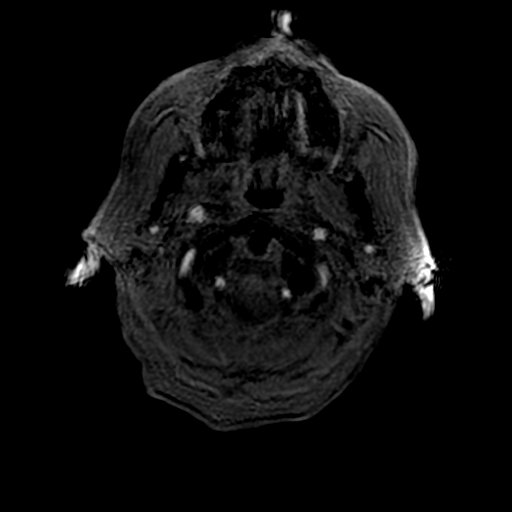
[im 13/52]
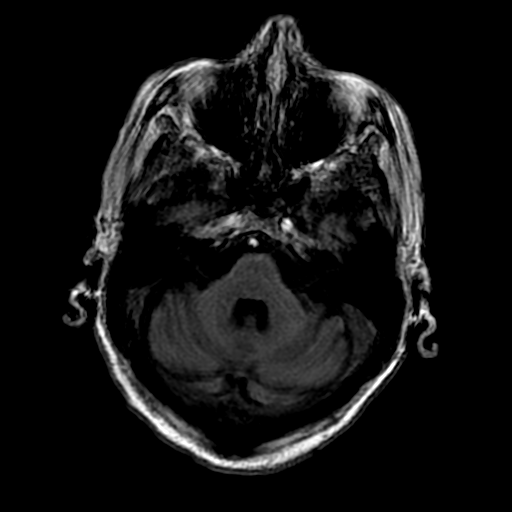

[Series 400: DWI · axial · 3.0mm · 1.09mm/px · z∈[-57,+94]mm · 5 of 52 slices shown (3 of 4)]
[im 1/52]
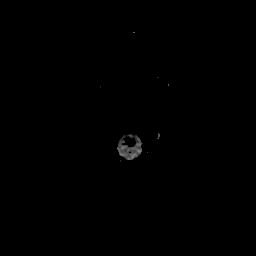
[im 13/52]
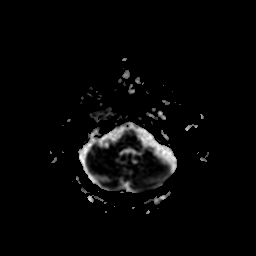
[im 26/52]
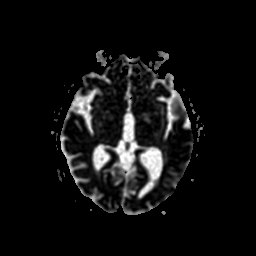
[im 39/52]
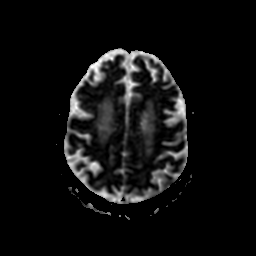
[im 52/52]
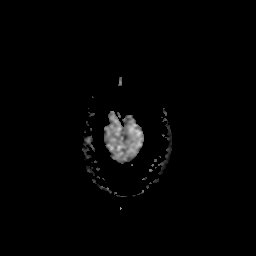

[Series 500: DWI · coronal · 3.0mm · 1.09mm/px · 6 of 63 slices shown (4 of 4)]
[im 1/63]
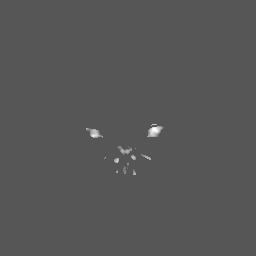
[im 13/63]
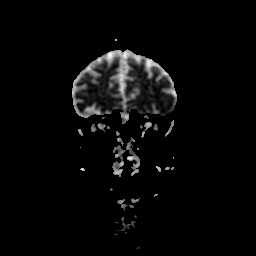
[im 25/63]
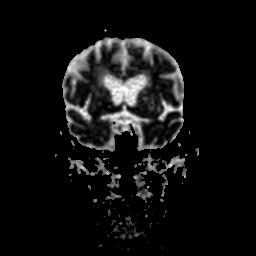
[im 38/63]
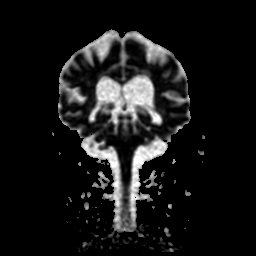
[im 50/63]
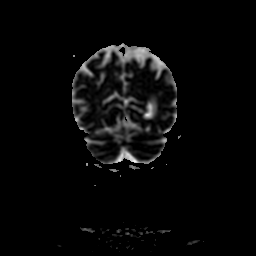
[im 63/63]
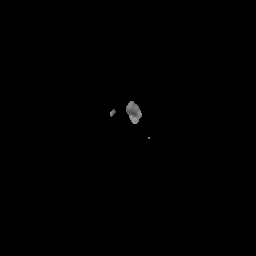

[45 of 48 positions shown; findings below may reference images not displayed]

FINDINGS: Brain: Diffusion-weighted images demonstrate no acute or subacute
infarction. Acute hemorrhage or mass lesion is present. Ventricles
are proportionate to the degree of atrophy. Moderate periventricular
and subcortical white matter changes are present bilaterally.
Dilated perivascular spaces are present within the basal ganglia.
White matter changes extend into the brainstem. The cerebellum is
unremarkable. The internal auditory canals are within normal limits.

Vascular: Flow is present in the major intracranial arteries.
Bilateral lens replacements are present. Globes and orbits are
otherwise unremarkable

Skull and upper cervical spine: Craniocervical junction is normal.
Upper cervical spine is within normal limits. A relatively empty
sella is noted.

Sinuses/Orbits: Mild mucosal thickening bilaterally is present in
the ethmoid air cells paranasal sinuses and mastoid air cells are
otherwise clear.
IMPRESSION: 1. No acute or focal abnormality to explain acute confusion or
altered level of consciousness.
2. Atrophy and white matter disease are moderately advanced, even
for age. This likely reflects the sequela of chronic microvascular
ischemia.
3. Minimal sinus disease.

## 2020-09-20 ENCOUNTER — Other Ambulatory Visit: Payer: Self-pay | Admitting: Cardiology

## 2020-09-30 ENCOUNTER — Other Ambulatory Visit: Payer: Self-pay | Admitting: Cardiology

## 2020-09-30 DIAGNOSIS — E7800 Pure hypercholesterolemia, unspecified: Secondary | ICD-10-CM

## 2020-12-10 ENCOUNTER — Ambulatory Visit: Payer: Medicare Other | Admitting: Cardiology

## 2020-12-10 ENCOUNTER — Other Ambulatory Visit: Payer: Self-pay

## 2020-12-10 ENCOUNTER — Encounter: Payer: Self-pay | Admitting: Cardiology

## 2020-12-10 VITALS — BP 110/63 | HR 52 | Temp 98.3°F | Resp 16 | Ht 67.0 in | Wt 167.0 lb

## 2020-12-10 DIAGNOSIS — I480 Paroxysmal atrial fibrillation: Secondary | ICD-10-CM

## 2020-12-10 DIAGNOSIS — R001 Bradycardia, unspecified: Secondary | ICD-10-CM

## 2020-12-10 DIAGNOSIS — I2510 Atherosclerotic heart disease of native coronary artery without angina pectoris: Secondary | ICD-10-CM

## 2020-12-10 NOTE — Progress Notes (Signed)
Primary Physician/Referring:  Chilton Greathouse, MD  Patient ID: Stuart Flores, male    DOB: 19-Sep-1933, 87 y.o.   MRN: [id number]  Chief Complaint  Patient presents with   Atrial Fibrillation   Bradycardia   Follow-up    6 months   HPI:    Stuart Flores  is a 87 y.o.  Caucasian gentleman with h/o CAD, on  03/03/2015 underwent coronary angiography and stenting to subtotally occluded dominant circumflex coronary artery and mid LAD by implantation of 4.0 x 30 mm and 2.75 x 22 mm resolute DES respectively on 03/04/2015.  Past medical history significant for paroxysmal atrial fibrillation, diagnosed by an EKG in the PCP office but never documented elsewhere.  There is no history of hypertension or hyperlipidemia and chronic mild leg edema and uses support stockings.   He is presently asymptomatic and does not think that he has had any more episodes of atrial fibrillation.  He has not had any recurrence of angina pectoris.  He is tolerating anticoagulation without any bleeding diathesis.  Otherwise no other symptoms.  Past Medical History:  Diagnosis Date   Arthritis    ostearthritis- back, knee   BPH with obstruction/lower urinary tract symptoms    urologist-  dr r. evans @[facility]    Bradycardia    Coronary artery disease    cardiologist-- dr   History of glaucoma    per pt no issues since cataract extraction's bilaterally 2015   History of squamous cell carcinoma excision    face x2   PAF (paroxysmal atrial fibrillation) [facility]    cardiologist-- dr [facility]   S/P drug eluting coronary stent placement 03/04/2015   DES x1 to mid CFx and DES x1 to mid LAD   Wears glasses    Wears hearing aid in both ears    Past Surgical History:  Procedure Laterality Date   CARDIAC CATHETERIZATION N/A 03/04/2015   Procedure: Left Heart Cath and Coronary Angiography;  Surgeon: 03/06/2015, MD;  Location: [facility] INVASIVE CV LAB;  Service: Cardiovascular;  Laterality: N/A;   CARDIAC CATHETERIZATION   03/04/2015   Procedure: Coronary Stent Intervention;  Surgeon: 03/06/2015, MD;  Location: [facility] INVASIVE CV LAB;  Service: Cardiovascular;;  DES to mCFx and mLAD   CARDIOVASCULAR STRESS TEST  12-27-2016   dr 12-29-2016   Low risk nuclear study w/ no ischemia/  normal LV function and wall function, ef 67%   CATARACT EXTRACTION W/ INTRAOCULAR LENS  IMPLANT, BILATERAL Bilateral 2015   EYE SURGERY     Bilateral cataracts x2   HAND SURGERY Left early 2000s   "thumb surgery for Tendon pair"   KNEE ARTHROSCOPY Bilateral    LUMBAR LAMINECTOMY/DECOMPRESSION MICRODISCECTOMY Left 03/07/2019   Procedure: Left far lateral disectomy L4-5;  Surgeon: 14/05/2018, MD;  Location: [facility] OR;  Service: Orthopedics;  Laterality: Left;  2 hrs   STERIOD INJECTION Left 11/24/2017   Procedure: LEFT KNEE CORTISONE INJECTION;  Surgeon: 11/26/2017, MD;  Location: [facility] ORS;  Service: Orthopedics;  Laterality: Left;   TONSILLECTOMY  child   TOTAL KNEE ARTHROPLASTY Right 10/27/2015   Procedure: TOTAL RIGHT KNEE ARTHROPLASTY;  Surgeon: 10/29/2015, MD;  Location: [facility] ORS;  Service: Orthopedics;  Laterality: Right;   TOTAL KNEE ARTHROPLASTY Left 03/09/2018   Procedure: TOTAL KNEE ARTHROPLASTY;  Surgeon: 14/08/2017, MD;  Location: [facility] ORS;  Service: Orthopedics;  Laterality: Left;  70 mins with block   TOTAL KNEE REVISION WITH SCAR DEBRIDEMENT/PATELLA REVISION WITH POLY EXCHANGE Right 11/24/2017  Procedure: RIGHT KNEE OPEN ARTHROTOMY WITH SCAR DEBRIDEMENT AND POSSIBLE POLY EXCHANGE;  Surgeon: Durene Romans, MD;  Location: [facility] ORS;  Service: Orthopedics;  Laterality: Right;  90 mins   TRANSTHORACIC ECHOCARDIOGRAM  01-20-2017   dr Jacinto Halim   mild concentric LVH, ef 55%/  trace AR/  mild MR, TR and  PR   Family History  Problem Relation Age of Onset   Heart attack Mother    Arthritis/Rheumatoid Mother    Pancreatic cancer Father    Other Brother        car accident    Social History   Tobacco Use   Smoking status: Former     Packs/day: 1.00    Years: 9.00    Pack years: 9.00    Types: Cigarettes    Quit date: 12/04/1963    Years since quitting: 57.0   Smokeless tobacco: Never   Tobacco comments:    stopped smoking cigarettes at age 72  Substance Use Topics   Alcohol use: Yes    Alcohol/week: 9.0 standard drinks    Types: 9 Glasses of wine per week    Comment: wine or beer daily   ROS  Review of Systems  Cardiovascular:  Positive for leg swelling (chronic). Negative for chest pain and dyspnea on exertion.  Musculoskeletal:  Positive for arthritis.  Gastrointestinal:  Negative for melena.   Objective  Blood pressure 110/63, pulse (!) 52, temperature 98.3 F (36.8 C), temperature source Temporal, resp. rate 16, height 5\' 7"  (1.702 m), weight 167 lb (75.8 kg), SpO2 94 %.   Vitals with BMI 12/10/2020 06/06/2020 12/05/2019  Height 5\' 7"  5\' 7"  5\' 7"   Weight 167 lbs 165 lbs 6 oz 166 lbs  BMI 26.15 25.9 25.99  Systolic 110 108 02/04/2020  Diastolic 63 67 68  Pulse 52 53 51     Physical Exam Constitutional:      General: He is not in acute distress.    Appearance: He is well-developed.  Cardiovascular:     Rate and Rhythm: Regular rhythm. Bradycardia present.     Pulses: Intact distal pulses.          Carotid pulses are 2+ on the right side with bruit and 2+ on the left side with bruit.      Radial pulses are 2+ on the right side and 2+ on the left side.       Popliteal pulses are 2+ on the right side and 2+ on the left side.       Dorsalis pedis pulses are 0 on the right side and 0 on the left side.       Posterior tibial pulses are 0 on the right side and 0 on the left side.     Heart sounds: Normal heart sounds. No murmur heard.   No gallop.     Comments: Loss of hair and varicose veins are present in bilateral lower extremity.  No skin breakdown. trace+ bilateral  Edema. No JVD Pulmonary:     Effort: Pulmonary effort is normal.     Breath sounds: Normal breath sounds.  Abdominal:     General: Bowel  sounds are normal.     Palpations: Abdomen is soft.   Laboratory examination:   No results for input(s): NA, K, CL, CO2, GLUCOSE, BUN, CREATININE, CALCIUM, GFRNONAA, GFRAA in the last 8760 hours. CrCl cannot be calculated (Patient's most recent lab result is older than the maximum 21 days allowed.).  CMP Latest Ref Rng & Units 05/27/2019  04/09/2019 03/05/2019  Glucose 70 - 99 mg/dL 92 979(G) 81  BUN 8 - 23 mg/dL 19 19 22   Creatinine 0.61 - 1.24 mg/dL 9.21 1.94  Sodium 135 - 145 mmol/L 140 132(L) 140  Potassium 3.5 - 5.1 mmol/L 4.4 4.2 4.3  Chloride 98 - 111 mmol/L 105 98 106  CO2 22 - 32 mmol/L 25 26 28   Calcium 8.9 - 10.3 mg/dL 1.74) ) 8.9  Total Protein 6.5 - 8.1 g/dL - 6.0(L) -  Total Bilirubin 0.3 - 1.2 mg/dL - 0.8 -  Alkaline Phos 38 - 126 U/L - 41 -  AST 15 - 41 U/L - 30 -  ALT 0 - 44 U/L - 18 -   CBC Latest Ref Rng & Units 05/27/2019 04/09/2019 03/05/2019  WBC 4.0 - 10.5 K/uL 7.4 5.3 7.6  Hemoglobin 13.0 - 17.0 g/dL 12.0(L) 11.8(L) 12.4(L)  Hematocrit 39.0 - 52.0 % 37.9(L) 35.2(L) 38.9(L)  Platelets 150 - 400 K/uL 222 202 236   External labs:   Cholesterol, total 116.000 m 12/20/2019 HDL 36.000 mg 12/20/2019 LDL 66.000 mg 12/20/2019 Triglycerides 69.000 mg 12/20/2019  Hemoglobin 12.000 g/d 05/27/2019  Creatinine, Serum 0.970 mg/ 05/27/2019 Potassium 4.700 mEq 12/20/2019 ALT (SGPT) 19.000 IU/ 12/20/2019  TSH 1.650 12/20/2019  Medications and allergies  No Known Allergies   Current Outpatient Medications on File Prior to Visit  Medication Sig Dispense Refill   atorvastatin (LIPITOR) 40 MG tablet TAKE 1 TABLET (40 MG TOTAL) BY MOUTH DAILY. IN THE MORNING 30 tablet 6   ELIQUIS 5 MG TABS tablet TAKE 1 TABLET BY MOUTH TWICE A DAY 180 tablet 3   metoprolol succinate (TOPROL-XL) 25 MG 24 hr tablet TAKE 1/2 TABLET BY MOUTH EVERY DAY 90 tablet 1   Multiple Vitamin (MULTIVITAMIN) tablet Take 1 tablet by mouth daily.     nitroGLYCERIN (NITROSTAT) 0.4 MG SL tablet Place 1  tablet (0.4 mg total) under the tongue every 5 (five) minutes as needed for chest pain (No more than 3 doses). 25 tablet 3   Probiotic Product (PROBIOTIC ADVANCED PO) Take 1 tablet by mouth daily.     No current facility-administered medications on file prior to visit.    Radiology:    DG Chest 2 View Result Date: 05/27/2019 CLINICAL DATA:  Central chest pain upon wakening. EXAM: CHEST - 2 VIEW COMPARISON:  03/30/2018 FINDINGS: Heart size is normal. Mild tortuosity of the aorta. The pulmonary vascularity is normal. The lungs are clear. No effusions. No acute bone finding. Pectus deformity of the chest. IMPRESSION: No active cardiopulmonary disease. Electronically Signed   By: 05/29/2019 M.D.   On: 05/27/2019 05:17   Cardiac Studies:   Coronary angiogram 03/04/2015: Mid dominant CX 99% to 0% with 4x32mm Resolute DES. Mid LAD 80% to 0% with 2.75x71mm Resolute DES.  Exercise myoview stress 12/27/2016: 1. The patient performed treadmill exercise using a Bruce protocol, completing 5:00 minutes, achieving 6.89 METS, normal hemodynamic response. The patient did not develop symptoms other than fatigue during the procedure. 2. The stress electrocardiogram showed sinus tachycardia, normal stress conduction, no stress arrhythmias and normal stress repolarization. No ischemic changes. 3. The overall quality of the study is excellent. There is no evidence of abnormal lung activity. Stress and rest SPECT images demonstrate homogeneous tracer distribution throughout the myocardium. Gated SPECT imaging reveals normal myocardial thickening and wall motion. The left ventricular ejection fraction was normal (67%). This is a low risk study.  Echocardiogram 01/20/2017: Left ventricle cavity is normal in size.  Mild concentric hypertrophy of the left ventricle. Normal global wall motion. Calculated EF 55%. Mild (Grade I) mitral regurgitation. Mild tricuspid regurgitation. Mild pulmonic regurgitation. No evidence of  pulmonary   EKG:  EKG 12/10/2020: Marked sinus bradycardia at rate of 48 bpm, first-degree AV block, poor R wave progression, probably normal variant.  No evidence of ischemia, normal QT interval.  No significant change from 06/26/2020, heart rate was 49 bpm.  EKG 06/06/2020: Sinus bradycardia at rate of 49 bpm, otherwise normal EKG.   No significant change from EKG 12/05/2019: Sinus bradycardia at rate of 46 bpm,. 06/04/2019: Marked sinus bradycardia at rate of 47 bpm, No significant change from 12/04/2018.  Assessment     ICD-10-CM   1. Paroxysmal atrial fibrillation (HCC)  I48.0 EKG 12-Lead    2. Coronary artery disease involving native coronary artery of native heart without angina pectoris  I25.10     3. Bradycardia by electrocardiogram  R00.1       CHA2DS2-VASc Score is 3.  Yearly risk of stroke: 3.2% (A, CAD).  Score of 1=0.6; 2=2.2; 3=3.2; 4=4.8; 5=7.2; 6=9.8; 7=>9.8) -(CHF; HTN; vasc disease DM,  Male = 1; Age <65 =0; 65-74 = 1,  >75 =2; stroke/embolism= 2).    No orders of the defined types were placed in this encounter.   There are no discontinued medications.   Recommendations:   Stuart Flores  is a  87 y.o.  Caucasian gentleman with h/o CAD, on  03/03/2015 underwent coronary angiography and stenting to subtotally occluded dominant circumflex coronary artery and mid LAD by implantation of 4.0 x 30 mm and 2.75 x 22 mm resolute DES respectively on 03/04/2015.  Past medical history significant for paroxysmal atrial fibrillation, diagnosed by an EKG in the PCP office but never documented elsewhere.  There is no history of hypertension or hyperlipidemia and chronic mild leg edema and uses support stockings.   He is presently asymptomatic and does not think that he has had any more episodes of atrial fibrillation.  He has not had any recurrence of angina pectoris.  He is tolerating anticoagulation without any bleeding diathesis.  He had questions regarding continuation of  anticoagulation versus discontinuing and going on aspirin alone.  We had a 15-minute discussion regarding ACC, AHA recommendations regarding anticoagulation, he is at the 50th percentile with regard to score of 3.2 with regard to CHA2DS2-VASc.  We discussed regarding mutual decision making and informed decision making regarding discontinuing anticoagulation versus continuing the anticoagulation, just aspirin for CAD, considering loop recorder implantation in view of his advanced age, persistent sinus bradycardia and also to evaluate for any recurrence of atrial fibrillation and discontinuing anticoagulation with this approach.  At this point he would like to continue with anticoagulation and he would like to discuss these options with his wife  With regard to underlying marked sinus bradycardia, he remains asymptomatic and this is unchanged from prior EKG as well.  Hypertension is well controlled.  I will see him back in 6 months for follow-up.   Stuart DecampJay Emmani Lesueur, MD, [facility] 12/10/2020, 11:04 AM Office: [phone]

## 2021-05-08 ENCOUNTER — Other Ambulatory Visit: Payer: Self-pay | Admitting: Cardiology

## 2021-05-08 DIAGNOSIS — E7800 Pure hypercholesterolemia, unspecified: Secondary | ICD-10-CM

## 2021-05-18 ENCOUNTER — Other Ambulatory Visit: Payer: Self-pay | Admitting: Cardiology

## 2021-05-18 DIAGNOSIS — E7800 Pure hypercholesterolemia, unspecified: Secondary | ICD-10-CM

## 2021-06-10 ENCOUNTER — Other Ambulatory Visit: Payer: Self-pay

## 2021-06-10 ENCOUNTER — Ambulatory Visit: Payer: Medicare Other | Admitting: Cardiology

## 2021-06-10 ENCOUNTER — Encounter: Payer: Self-pay | Admitting: Cardiology

## 2021-06-10 VITALS — BP 130/72 | HR 57 | Temp 98.6°F | Ht 67.0 in | Wt 173.2 lb

## 2021-06-10 DIAGNOSIS — R001 Bradycardia, unspecified: Secondary | ICD-10-CM

## 2021-06-10 DIAGNOSIS — I480 Paroxysmal atrial fibrillation: Secondary | ICD-10-CM

## 2021-06-10 DIAGNOSIS — I2510 Atherosclerotic heart disease of native coronary artery without angina pectoris: Secondary | ICD-10-CM

## 2021-06-10 NOTE — Progress Notes (Signed)
Primary Physician/Referring:  [name], MD  Patient ID: Stuart Flores, male    DOB: [birth year], 88 y.o.   MRN: [id number]  Chief Complaint  Patient presents with   Atrial Fibrillation   Coronary Artery Disease   Follow-up    6 months   HPI:    Stuart Flores  is a 88 y.o.  Caucasian gentleman with h/o CAD, on  03/03/2015 underwent coronary angiography and stenting to subtotally occluded dominant circumflex coronary artery and mid LAD by implantation of 4.0 x 30 mm and 2.75 x 22 mm resolute DES respectively on 03/04/2015.  Past medical history significant for paroxysmal atrial fibrillation, diagnosed by an EKG in the PCP office but never documented elsewhere.  There is no history of hypertension or hyperlipidemia and chronic mild leg edema and uses support stockings.  He has noticed mild worsening leg edema after he returned back from an [place] last week.  No pain.  Remains asymptomatic otherwise.  He does not think that he has had any more episodes of atrial fibrillation.  He has not had any recurrence of angina pectoris.  He is tolerating anticoagulation without any bleeding diathesis.  Otherwise no other symptoms.  Past Medical History:  Diagnosis Date   Arthritis    ostearthritis- back, knee   BPH with obstruction/lower urinary tract symptoms    urologist-  dr r. evans _0    Bradycardia    Coronary artery disease    cardiologist-- dr Einar Gip   History of glaucoma    per pt no issues since cataract extraction's bilaterally 2015   History of squamous cell carcinoma excision    face x2   PAF (paroxysmal atrial fibrillation) [facility])    cardiologist-- dr Einar Gip   S/P drug eluting coronary stent placement 03/04/2015   DES x1 to mid CFx and DES x1 to mid LAD   Wears glasses    Wears hearing aid in both ears    Social History   Tobacco Use   Smoking status: Former    Packs/day: 1.00    Years: 9.00    Pack years: 9.00    Types: Cigarettes    Quit date:  12/04/1963    Years since quitting: 57.5   Smokeless tobacco: Never   Tobacco comments:    stopped smoking cigarettes at age 52  Substance Use Topics   Alcohol use: Yes    Alcohol/week: 9.0 standard drinks    Types: 9 Glasses of wine per week    Comment: wine rarely   ROS  Review of Systems  Cardiovascular:  Positive for leg swelling (chronic). Negative for chest pain and dyspnea on exertion.  Musculoskeletal:  Positive for arthritis.  Gastrointestinal:  Negative for melena.   Objective  Blood pressure 130/72, pulse (!) 57, temperature 98.6 F (37 C), temperature source Temporal, height _1  (1.702 m), weight 173 lb 3.2 oz (78.6 kg), head circumference 16" (40.6 cm), SpO2 100 %.   Vitals with BMI 06/10/2021 12/10/2020 06/06/2020  Height _2  _3  _4   Weight 173 lbs 3 oz 167 lbs 165 lbs 6 oz  BMI 27.12 99.83 38.2  Systolic 505 397 673  Diastolic 72 63 67  Pulse 57 52 53     Physical Exam Constitutional:      General: He is not in acute distress.    Appearance: He is well-developed.  Cardiovascular:     Rate and Rhythm: Regular rhythm. Bradycardia present.     Pulses: Intact distal pulses.  Carotid pulses are 2+ on the right side with bruit and 2+ on the left side with bruit.      Radial pulses are 2+ on the right side and 2+ on the left side.       Popliteal pulses are 2+ on the right side and 2+ on the left side.       Dorsalis pedis pulses are 0 on the right side and 0 on the left side.       Posterior tibial pulses are 0 on the right side and 0 on the left side.     Heart sounds: Normal heart sounds. No murmur heard.   No gallop.     Comments: Loss of hair and varicose veins are present in bilateral lower extremity.  No skin breakdown. trace+ bilateral  Edema. No JVD Pulmonary:     Effort: Pulmonary effort is normal.     Breath sounds: Normal breath sounds.  Abdominal:     General: Bowel sounds are normal.     Palpations: Abdomen is soft.   Laboratory  examination:  External labs:   Labs 01/28/2021:  BUN 17, creatinine 0.9, EGFR 80 mL, potassium 4.8, LFTs normal.  Hb 13.2/HCT 39.7, platelets 233.  Total cholesterol 115, triglycerides 50, HDL 36, LDL 69.  TSH normal at 1.69.  Hemosure negative.  Medications and allergies  No Known Allergies    Current Outpatient Medications:    atorvastatin (LIPITOR) 40 MG tablet, TAKE 1 TABLET BY MOUTH EVERY DAY IN THE MORNING, Disp: 90 tablet, Rfl: 0   ELIQUIS 5 MG TABS tablet, TAKE 1 TABLET BY MOUTH TWICE A DAY, Disp: 180 tablet, Rfl: 3   metoprolol succinate (TOPROL-XL) 25 MG 24 hr tablet, TAKE 1/2 TABLET BY MOUTH EVERY DAY, Disp: 90 tablet, Rfl: 1   Multiple Vitamin (MULTIVITAMIN) tablet, Take 1 tablet by mouth daily., Disp: , Rfl:    nitroGLYCERIN (NITROSTAT) 0.4 MG SL tablet, Place 1 tablet (0.4 mg total) under the tongue every 5 (five) minutes as needed for chest pain (No more than 3 doses)., Disp: 25 tablet, Rfl: 3   Probiotic Product (PROBIOTIC ADVANCED PO), Take 1 tablet by mouth daily., Disp: , Rfl:    Radiology:    DG Chest 2 View Result Date: 05/27/2019 CLINICAL DATA:  Central chest pain upon wakening. EXAM: CHEST - 2 VIEW COMPARISON:  03/30/2018 FINDINGS: Heart size is normal. Mild tortuosity of the aorta. The pulmonary vascularity is normal. The lungs are clear. No effusions. No acute bone finding. Pectus deformity of the chest. IMPRESSION: No active cardiopulmonary disease. Electronically Signed   By: Nelson Chimes M.D.   On: 05/27/2019 05:17   Cardiac Studies:   Coronary angiogram 03/04/2015: Mid dominant CX 99% to 0% with 4x41m Resolute DES. Mid LAD 80% to 0% with 2.75x272mResolute DES.  Exercise myoview stress 12/27/2016: 1. The patient performed treadmill exercise using a Bruce protocol, completing 5:00 minutes, achieving 6.89 METS, normal hemodynamic response. The patient did not develop symptoms other than fatigue during the procedure. 2. The stress electrocardiogram showed  sinus tachycardia, normal stress conduction, no stress arrhythmias and normal stress repolarization. No ischemic changes. 3. The overall quality of the study is excellent. There is no evidence of abnormal lung activity. Stress and rest SPECT images demonstrate homogeneous tracer distribution throughout the myocardium. Gated SPECT imaging reveals normal myocardial thickening and wall motion. The left ventricular ejection fraction was normal (67%). This is a low risk study.  Echocardiogram 01/20/2017: Left ventricle cavity is normal  in size. Mild concentric hypertrophy of the left ventricle. Normal global wall motion. Calculated EF 55%. Mild (Grade I) mitral regurgitation. Mild tricuspid regurgitation. Mild pulmonic regurgitation. No evidence of pulmonary   EKG:  EKG 06/10/2021: Marked sinus bradycardia heart rate of 51 bpm with first-degree AV block, otherwise normal EKG.  No significant change from 12/10/2020 and 06/06/2020.  Assessment     ICD-10-CM   1. Paroxysmal atrial fibrillation (HCC)  I48.0 EKG 12-Lead    2. Coronary artery disease involving native coronary artery of native heart without angina pectoris  I25.10     3. Bradycardia by electrocardiogram  R00.1       CHA2DS2-VASc Score is 3.  Yearly risk of stroke: 3.2% (A, CAD).  Score of 1=0.6; 2=2.2; 3=3.2; 4=4.8; 5=7.2; 6=9.8; 7=>9.8) -(CHF; HTN; vasc disease DM,  Male = 1; Age <65 =0; 65-74 = 1,  >75 =2; stroke/embolism= 2).    No orders of the defined types were placed in this encounter.    There are no discontinued medications.   Recommendations:   Stuart Flores  is a  88 y.o.  Caucasian gentleman with h/o CAD, on  03/03/2015 underwent coronary angiography and stenting to subtotally occluded dominant circumflex coronary artery and mid LAD by implantation of 4.0 x 30 mm and 2.75 x 22 mm resolute DES respectively on 03/04/2015.  Past medical history significant for paroxysmal atrial fibrillation, diagnosed by an EKG in the  PCP office but never documented elsewhere.  There is no history of hypertension or hyperlipidemia and chronic mild leg edema and uses support stockings.   He is presently asymptomatic. He has not had any recurrence of angina pectoris.  He is tolerating anticoagulation without any bleeding diathesis. With regard to underlying marked sinus bradycardia, he remains asymptomatic and this is unchanged from prior EKG as well.  Hypertension is well controlled.  I reviewed his external labs, renal function, CBC are normal and lipids are under excellent control.  I will see him back in 6 months for follow-up.   Stuart Prows, MD, [facility] 06/10/2021, 6:04 PM Office: [phone]

## 2021-07-01 ENCOUNTER — Encounter: Payer: Self-pay | Admitting: Cardiology

## 2021-08-14 ENCOUNTER — Other Ambulatory Visit: Payer: Self-pay | Admitting: Cardiology

## 2021-09-11 ENCOUNTER — Other Ambulatory Visit: Payer: Self-pay | Admitting: Cardiology

## 2021-10-22 ENCOUNTER — Other Ambulatory Visit: Payer: Self-pay | Admitting: Cardiology

## 2021-10-22 DIAGNOSIS — E7800 Pure hypercholesterolemia, unspecified: Secondary | ICD-10-CM

## 2021-12-14 ENCOUNTER — Ambulatory Visit: Payer: Medicare Other | Admitting: Cardiology

## 2021-12-14 ENCOUNTER — Encounter: Payer: Self-pay | Admitting: Cardiology

## 2021-12-14 VITALS — BP 117/69 | HR 52 | Temp 98.0°F | Resp 16 | Ht 67.0 in | Wt 169.4 lb

## 2021-12-14 DIAGNOSIS — I2510 Atherosclerotic heart disease of native coronary artery without angina pectoris: Secondary | ICD-10-CM

## 2021-12-14 DIAGNOSIS — R001 Bradycardia, unspecified: Secondary | ICD-10-CM

## 2021-12-14 DIAGNOSIS — I480 Paroxysmal atrial fibrillation: Secondary | ICD-10-CM

## 2021-12-14 NOTE — Progress Notes (Signed)
Primary Physician/Referring:  [name], MD  Patient ID: Stuart Flores, male    DOB: 1933/05/28, 88 y.o.   MRN: [id number]  Chief Complaint  Patient presents with   Coronary Artery Disease   Atrial Fibrillation   Hypertension   Follow-up    6 months   HPI:    JAYMIR STRUBLE  is a 88 y.o.  Caucasian gentleman with h/o CAD, on  03/03/2015 underwent coronary angiography and stenting to subtotally occluded dominant circumflex coronary artery and mid LAD by implantation of 4.0 x 30 mm and 2.75 x 22 mm resolute DES respectively on 03/04/2015.  Past medical history significant for paroxysmal atrial fibrillation, diagnosed by an EKG in the PCP office but never documented elsewhere.    There is no history of hypertension or hyperlipidemia and chronic mild leg edema and uses support stockings.  He is walking 2 miles 3 times a week and attends exercise classes 2 times per week. He denies symptoms chest pain, shortness of breath, dizziness, or fatigue. He does not think that he has had any more episodes of atrial fibrillation. He is tolerating anticoagulation without any bleeding diathesis.   Past Medical History:  Diagnosis Date   Arthritis    ostearthritis- back, knee   BPH with obstruction/lower urinary tract symptoms    urologist-  dr Alfonso Patten. evans '@[facility]'    Bradycardia    Coronary artery disease    cardiologist-- dr Einar Gip   History of glaucoma    per pt no issues since cataract extraction's bilaterally 2015   History of squamous cell carcinoma excision    face x2   Hypertension    PAF (paroxysmal atrial fibrillation) [facility])    cardiologist-- dr Einar Gip   S/P drug eluting coronary stent placement 03/04/2015   DES x1 to mid CFx and DES x1 to mid LAD   Wears glasses    Wears hearing aid in both ears    Social History   Tobacco Use   Smoking status: Former    Packs/day: 1.00    Years: 9.00    Total pack years: 9.00    Types: Cigarettes    Quit date: 12/04/1963    Years since  quitting: 58.0   Smokeless tobacco: Never   Tobacco comments:    stopped smoking cigarettes at age 88  Substance Use Topics   Alcohol use: Yes    Alcohol/week: 9.0 standard drinks of alcohol    Types: 9 Glasses of wine per week    Comment: wine rarely   ROS  Review of Systems  Cardiovascular:  Negative for chest pain, dyspnea on exertion, leg swelling (chronic) and palpitations.  Musculoskeletal:  Positive for arthritis.  Gastrointestinal:  Negative for melena.    Objective  Blood pressure 117/69, pulse (!) 52, temperature 98 F (36.7 C), temperature source Temporal, resp. rate 16, height '5\' 7"'  (1.702 m), weight 169 lb 6.4 oz (76.8 kg), SpO2 99 %.      12/14/2021   11:05 AM 06/10/2021    2:23 PM 12/10/2020   10:23 AM  Vitals with BMI  Height '5\' 7"'  '5\' 7"'  '5\' 7"'   Weight 169 lbs 6 oz 173 lbs 3 oz 167 lbs  BMI 26.53 91.47 82.95  Systolic 621 308 657  Diastolic 69 72 63  Pulse 52 57 52     Physical Exam Neck:     Vascular: No carotid bruit or JVD.  Cardiovascular:     Rate and Rhythm: Regular rhythm. Bradycardia present.  Pulses: Intact distal pulses.     Heart sounds: Normal heart sounds. No murmur heard.    No gallop.  Pulmonary:     Effort: Pulmonary effort is normal.     Breath sounds: Normal breath sounds.  Abdominal:     General: Bowel sounds are normal.     Palpations: Abdomen is soft.  Musculoskeletal:     Right lower leg: No edema.     Left lower leg: No edema.    Laboratory examination:  External labs:   Labs 01/28/2021:  BUN 17, creatinine 0.9, EGFR 80 mL, potassium 4.8, LFTs normal.  Hb 13.2/HCT 39.7, platelets 233.  Total cholesterol 115, triglycerides 50, HDL 36, LDL 69.  TSH normal at 1.69.  Hemosure negative.  Medications and allergies  No Known Allergies    Current Outpatient Medications:    atorvastatin (LIPITOR) 40 MG tablet, TAKE 1 TABLET BY MOUTH EVERY DAY IN THE MORNING, Disp: 90 tablet, Rfl: 0   ELIQUIS 5 MG TABS tablet, TAKE 1  TABLET BY MOUTH TWICE A DAY, Disp: 180 tablet, Rfl: 2   metoprolol succinate (TOPROL-XL) 25 MG 24 hr tablet, TAKE 1/2 TABLET BY MOUTH EVERY DAY, Disp: 45 tablet, Rfl: 5   Multiple Vitamin (MULTIVITAMIN) tablet, Take 1 tablet by mouth daily., Disp: , Rfl:    nitroGLYCERIN (NITROSTAT) 0.4 MG SL tablet, Place 1 tablet (0.4 mg total) under the tongue every 5 (five) minutes as needed for chest pain (No more than 3 doses)., Disp: 25 tablet, Rfl: 3   Probiotic Product (PROBIOTIC ADVANCED PO), Take 1 tablet by mouth daily., Disp: , Rfl:    Radiology:    DG Chest 2 View Result Date: 05/27/2019 CLINICAL DATA:  Central chest pain upon wakening. EXAM: CHEST - 2 VIEW COMPARISON:  03/30/2018 FINDINGS: Heart size is normal. Mild tortuosity of the aorta. The pulmonary vascularity is normal. The lungs are clear. No effusions. No acute bone finding. Pectus deformity of the chest. IMPRESSION: No active cardiopulmonary disease. Electronically Signed   By: Nelson Chimes M.D.   On: 05/27/2019 05:17   Cardiac Studies:   Coronary angiogram 03/04/2015: Mid dominant CX 99% to 0% with 4x65m Resolute DES. Mid LAD 80% to 0% with 2.75x25mResolute DES.  Exercise myoview stress 12/27/2016: 1. The patient performed treadmill exercise using a Bruce protocol, completing 5:00 minutes, achieving 6.89 METS, normal hemodynamic response. The patient did not develop symptoms other than fatigue during the procedure. 2. The stress electrocardiogram showed sinus tachycardia, normal stress conduction, no stress arrhythmias and normal stress repolarization. No ischemic changes. 3. The overall quality of the study is excellent. There is no evidence of abnormal lung activity. Stress and rest SPECT images demonstrate homogeneous tracer distribution throughout the myocardium. Gated SPECT imaging reveals normal myocardial thickening and wall motion. The left ventricular ejection fraction was normal (67%). This is a low risk  study.  Echocardiogram 01/20/2017: Left ventricle cavity is normal in size. Mild concentric hypertrophy of the left ventricle. Normal global wall motion. Calculated EF 55%. Mild (Grade I) mitral regurgitation. Mild tricuspid regurgitation. Mild pulmonic regurgitation. No evidence of pulmonary   EKG: EKG 12/14/2021: Marked sinus bradycardia with first-degree block at rate of 45 beats minute, otherwise normal EKG.  No change from 06/10/2021, previous heart rate was 51 bpm.  Since  Assessment     ICD-10-CM   1. Paroxysmal atrial fibrillation (HCC)  I48.0 EKG 12-Lead    2. Coronary artery disease involving native coronary artery of native heart without angina pectoris  I25.10     3. Bradycardia by electrocardiogram  R00.1       CHA2DS2-VASc Score is 3.  Yearly risk of stroke: 3.2% (A, CAD).  Score of 1=0.6; 2=2.2; 3=3.2; 4=4.8; 5=7.2; 6=9.8; 7=>9.8) -(CHF; HTN; vasc disease DM,  Male = 1; Age <65 =0; 65-74 = 1,  >75 =2; stroke/embolism= 2).    No orders of the defined types were placed in this encounter.    There are no discontinued medications.   Recommendations:   TRASEAN DELIMA  is a  88 y.o.  Caucasian gentleman with h/o CAD, on  03/03/2015 underwent coronary angiography and stenting to subtotally occluded dominant circumflex coronary artery and mid LAD by implantation of 4.0 x 30 mm and 2.75 x 22 mm resolute DES respectively on 03/04/2015.  Past medical history significant for paroxysmal atrial fibrillation, diagnosed by an EKG in the PCP office but never documented elsewhere. He is on anticoagulation by choice. There is no history of hypertension or hyperlipidemia and chronic mild leg edema and uses support stockings.   He is presently asymptomatic. He has not had any recurrence of angina pectoris.  He is tolerating anticoagulation without any bleeding diathesis. With regard to underlying marked sinus bradycardia, he remains asymptomatic and this is unchanged from prior EKG as  well.  Blood pressure is well controlled.  He will follow-up with his PCP for lab work. He will follow-up with me in 6 months.   Adrian Prows, MD, [facility] 12/14/2021, 11:34 AM Office: [phone]

## 2022-01-21 ENCOUNTER — Other Ambulatory Visit: Payer: Self-pay | Admitting: Cardiology

## 2022-01-21 DIAGNOSIS — E7800 Pure hypercholesterolemia, unspecified: Secondary | ICD-10-CM

## 2022-04-15 ENCOUNTER — Encounter: Payer: Self-pay | Admitting: Cardiology

## 2022-06-14 ENCOUNTER — Other Ambulatory Visit: Payer: Self-pay | Admitting: Cardiology

## 2022-06-14 DIAGNOSIS — E7800 Pure hypercholesterolemia, unspecified: Secondary | ICD-10-CM

## 2022-06-17 ENCOUNTER — Encounter: Payer: Self-pay | Admitting: Cardiology

## 2022-06-17 ENCOUNTER — Ambulatory Visit: Payer: Medicare Other | Admitting: Cardiology

## 2022-06-17 VITALS — BP 114/57 | HR 52 | Resp 16 | Ht 67.0 in | Wt 167.8 lb

## 2022-06-17 DIAGNOSIS — I480 Paroxysmal atrial fibrillation: Secondary | ICD-10-CM

## 2022-06-17 DIAGNOSIS — I2510 Atherosclerotic heart disease of native coronary artery without angina pectoris: Secondary | ICD-10-CM

## 2022-06-17 DIAGNOSIS — R001 Bradycardia, unspecified: Secondary | ICD-10-CM

## 2022-06-17 NOTE — Progress Notes (Signed)
Primary Physician/Referring:  [name], MD  Patient ID: Stuart Flores, male    DOB: 10-31-1933, 89 y.o.   MRN: [id number]  No chief complaint on file.  HPI:    Stuart Flores  is a 89 y.o.  Caucasian gentleman with h/o CAD, on  03/03/2015 underwent coronary angiography and stenting to subtotally occluded dominant circumflex coronary artery and mid LAD by implantation of 4.0 x 30 mm and 2.75 x 22 mm resolute DES respectively on 03/04/2015.  Past medical history significant for paroxysmal atrial fibrillation, diagnosed by an EKG in the PCP office but never documented elsewhere. He is on anticoagulation by choice, hypercholesterolemia and chronic mild leg edema and uses support stockings.   This is a 87-monthoffice visit.  He remains asymptomatic.  He is walking 2 miles 3 times a week and attends exercise classes 2 times per week. He is tolerating anticoagulation without any bleeding diathesis.   Past Medical History:  Diagnosis Date  . Arthritis    ostearthritis- back, knee  . BPH with obstruction/lower urinary tract symptoms    urologist-  dr r. evans '@[facility]'$   . Bradycardia   . Coronary artery disease    cardiologist-- dr gEinar Gip . History of glaucoma    per pt no issues since cataract extraction's bilaterally 2015  . History of squamous cell carcinoma excision    face x2  . Hypertension   . PAF (paroxysmal atrial fibrillation) ([facility]    cardiologist-- dr gEinar Gip . S/P drug eluting coronary stent placement 03/04/2015   DES x1 to mid CFx and DES x1 to mid LAD  . Wears glasses   . Wears hearing aid in both ears    Social History   Tobacco Use  . Smoking status: Former    Packs/day: 1.00    Years: 9.00    Additional pack years: 0.00    Total pack years: 9.00    Types: Cigarettes    Quit date: 12/04/1963    Years since quitting: 58.5  . Smokeless tobacco: Never  . Tobacco comments:    stopped smoking cigarettes at age [id number] Substance Use Topics  . Alcohol use: Yes     Alcohol/week: 9.0 standard drinks of alcohol    Types: 9 Glasses of wine per week    Comment: wine rarely   ROS  Review of Systems  Cardiovascular:  Negative for chest pain, dyspnea on exertion, leg swelling (chronic) and palpitations.  Gastrointestinal:  Negative for melena.    Objective  Blood pressure (!) 114/57, pulse (!) 52, resp. rate 16, height '5\' 7"'$  (1.702 m), weight 167 lb 12.8 oz (76.1 kg), SpO2 98 %.      06/17/2022   11:07 AM 12/14/2021   11:05 AM 06/10/2021    2:23 PM  Vitals with BMI  Height '5\' 7"'$  '5\' 7"'$  '5\' 7"'$   Weight 167 lbs 13 oz 169 lbs 6 oz 173 lbs 3 oz  BMI 26.28 [id number] Systolic [id number] Diastolic 57 69 72  Pulse 52 52 57     Physical Exam Neck:     Vascular: No carotid bruit or JVD.  Cardiovascular:     Rate and Rhythm: Regular rhythm. Bradycardia present.     Pulses: Intact distal pulses.     Heart sounds: Normal heart sounds. No murmur heard.    No gallop.  Pulmonary:     Effort: Pulmonary effort is normal.     Breath sounds: Normal breath sounds.  Abdominal:     General: Bowel sounds are normal.     Palpations: Abdomen is soft.  Musculoskeletal:     Right lower leg: No edema.     Left lower leg: No edema.   Laboratory examination:  External labs:   Cholesterol, total 120.000 m 02/16/2022 HDL 40.000 mg 02/16/2022 LDL 69.000 mg 02/16/2022 Triglycerides 56.000 mg 02/16/2022  ALT (SGPT) 21.000 IU/ 02/16/2022 eGFR 91.700 02/16/2022  TSH 1.340 02/16/2022  Labs 01/28/2021:  BUN 17, creatinine 0.9, EGFR 80 mL, potassium 4.8, LFTs normal.  Hb 13.2/HCT 39.7, platelets 233.  Total cholesterol 115, triglycerides 50, HDL 36, LDL 69.  TSH normal at 1.69.  Hemosure negative.  Medications and allergies  No Known Allergies    Current Outpatient Medications:  .  atorvastatin (LIPITOR) 40 MG tablet, TAKE 1 TABLET BY MOUTH EVERY DAY IN THE MORNING, Disp: 90 tablet, Rfl: 1 .  ELIQUIS 5 MG TABS tablet, TAKE 1 TABLET BY MOUTH TWICE A DAY,  Disp: 180 tablet, Rfl: 2 .  metoprolol succinate (TOPROL-XL) 25 MG 24 hr tablet, TAKE 1/2 TABLET BY MOUTH EVERY DAY, Disp: 45 tablet, Rfl: 5 .  Multiple Vitamin (MULTIVITAMIN) tablet, Take 1 tablet by mouth daily., Disp: , Rfl:  .  nitroGLYCERIN (NITROSTAT) 0.4 MG SL tablet, Place 1 tablet (0.4 mg total) under the tongue every 5 (five) minutes as needed for chest pain (No more than 3 doses)., Disp: 25 tablet, Rfl: 3 .  Probiotic Product (PROBIOTIC ADVANCED PO), Take 1 tablet by mouth daily., Disp: , Rfl:    Radiology:    DG Chest 2 View Result Date: 05/27/2019 CLINICAL DATA:  Central chest pain upon wakening. EXAM: CHEST - 2 VIEW COMPARISON:  03/30/2018 FINDINGS: Heart size is normal. Mild tortuosity of the aorta. The pulmonary vascularity is normal. The lungs are clear. No effusions. No acute bone finding. Pectus deformity of the chest. IMPRESSION: No active cardiopulmonary disease. Electronically Signed   By: Nelson Chimes M.D.   On: 05/27/2019 05:17   Cardiac Studies:   Coronary angiogram 03/04/2015: Mid dominant CX 99% to 0% with 4x26m Resolute DES. Mid LAD 80% to 0% with 2.75x259mResolute DES.  Exercise myoview stress 12/27/2016: 1. The patient performed treadmill exercise using a Bruce protocol, completing 5:00 minutes, achieving 6.89 METS, normal hemodynamic response. The patient did not develop symptoms other than fatigue during the procedure. 2. The stress electrocardiogram showed sinus tachycardia, normal stress conduction, no stress arrhythmias and normal stress repolarization. No ischemic changes. 3. The overall quality of the study is excellent. There is no evidence of abnormal lung activity. Stress and rest SPECT images demonstrate homogeneous tracer distribution throughout the myocardium. Gated SPECT imaging reveals normal myocardial thickening and wall motion. The left ventricular ejection fraction was normal (67%). This is a low risk study.  Echocardiogram 01/20/2017: Left  ventricle cavity is normal in size. Mild concentric hypertrophy of the left ventricle. Normal global wall motion. Calculated EF 55%. Mild (Grade I) mitral regurgitation. Mild tricuspid regurgitation. Mild pulmonic regurgitation. No evidence of pulmonary   EKG:  EKG 06/17/2022: Marked sinus bradycardia at rate of 48 bpm with borderline first-degree AV block, poor R progression, probably normal variant.  Low-voltage complexes, consider pulmonary disease pattern. Compared to 12/14/2021, no significant change.  Assessment     ICD-10-CM   1. Coronary artery disease involving native coronary artery of native heart without angina pectoris  I25.10 EKG 12-Lead    2. Paroxysmal atrial fibrillation (HCC)  I48.0     3.  Bradycardia by electrocardiogram  R00.1       CHA2DS2-VASc Score is 3.  Yearly risk of stroke: 3.2% (A, CAD).  Score of 1=0.6; 2=2.2; 3=3.2; 4=4.8; 5=7.2; 6=9.8; 7=>9.8) -(CHF; HTN; vasc disease DM,  Male = 1; Age <65 =0; 65-74 = 1,  >75 =2; stroke/embolism= 2).    No orders of the defined types were placed in this encounter.    There are no discontinued medications.   Recommendations:   Stuart Flores  is a  89 y.o.  Caucasian gentleman with h/o CAD, on  03/03/2015 underwent coronary angiography and stenting to subtotally occluded dominant circumflex coronary artery and mid LAD by implantation of 4.0 x 30 mm and 2.75 x 22 mm resolute DES respectively on 03/04/2015.  Past medical history significant for paroxysmal atrial fibrillation, diagnosed by an EKG in the PCP office but never documented elsewhere. He is on anticoagulation by choice, hypercholesterolemia and chronic mild leg edema and uses support stockings.   1. Coronary artery disease involving native coronary artery of native heart without angina pectoris Has had stenting to his circumflex coronary artery remotely in 2016 and has not had any recurrence of angina pectoris.  He continues to exercise regularly and remains  asymptomatic.  Do not think he needs any stress testing or echocardiogram in the absence of abnormal physical exam and in the absence of any symptoms.  He is on appropriate medical therapy.  Lipids under excellent control.  He is on high intensity statins.  2. Paroxysmal atrial fibrillation (HCC) No recurrence of atrial fibrillation that is documented.  He is on anticoagulation as per his preference.  3. Bradycardia by electrocardiogram Asymptomatic sinus bradycardia noted he is not on any negative inotropic agents, I been monitoring his EKG closely, today's EKG shows borderline first-degree AV block.  Will continue to monitor for any high degree AV block.  Labs reviewed, all labs including renal function are normal, I will see him back in 6 months or sooner if problems.   Adrian Prows, MD, [facility] 06/17/2022, 11:39 AM Office: [phone]

## 2022-12-05 ENCOUNTER — Other Ambulatory Visit: Payer: Self-pay | Admitting: Cardiology

## 2022-12-06 ENCOUNTER — Other Ambulatory Visit: Payer: Self-pay | Admitting: Cardiology

## 2022-12-06 DIAGNOSIS — E7800 Pure hypercholesterolemia, unspecified: Secondary | ICD-10-CM

## 2022-12-13 ENCOUNTER — Ambulatory Visit: Payer: Medicare Other | Admitting: Cardiology

## 2022-12-13 ENCOUNTER — Encounter: Payer: Self-pay | Admitting: Cardiology

## 2022-12-13 VITALS — BP 139/82 | Resp 16 | Ht 67.0 in | Wt 168.2 lb

## 2022-12-13 DIAGNOSIS — I2510 Atherosclerotic heart disease of native coronary artery without angina pectoris: Secondary | ICD-10-CM

## 2022-12-13 DIAGNOSIS — I480 Paroxysmal atrial fibrillation: Secondary | ICD-10-CM

## 2022-12-13 NOTE — Progress Notes (Signed)
Primary Physician/Referring:  Chilton Greathouse, MD  Patient ID: Stuart Flores, male    DOB: November 24, 1933, 89 y.o.   MRN: [id number]  Chief Complaint  Patient presents with   Coronary artery disease involving native coronary artery of   Paroxysmal atrial fibrillation    Hyperlipidemia   Follow-up    6 months   HPI:    Stuart Flores  is a 89 y.o.  Caucasian gentleman with h/o CAD, on  03/03/2015 underwent coronary angiography and stenting to subtotally occluded dominant circumflex coronary artery and mid LAD by implantation of 4.0 x 30 mm and 2.75 x 22 mm resolute DES respectively on 03/04/2015.  Past medical history significant for paroxysmal atrial fibrillation, diagnosed by an EKG in the PCP office but never documented elsewhere. He is on anticoagulation by choice, hypercholesterolemia and chronic mild leg edema and uses support stockings as needed.  This is a 97-month office visit.  He remains asymptomatic.  He is walking 2 miles 3 times a week and attends exercise classes 2 times per week. He is tolerating anticoagulation without any bleeding diathesis.   Past Medical History:  Diagnosis Date   Arthritis    ostearthritis- back, knee   BPH with obstruction/lower urinary tract symptoms    urologist-  dr Elvera Lennox. evans @[facility]    Bradycardia    Coronary artery disease    cardiologist-- dr Jacinto Halim   History of glaucoma    per pt no issues since cataract extraction's bilaterally 2015   History of squamous cell carcinoma excision    face x2   Hypertension    PAF (paroxysmal atrial fibrillation) [facility])    cardiologist-- dr Jacinto Halim   S/P drug eluting coronary stent placement 03/04/2015   DES x1 to mid CFx and DES x1 to mid LAD   Wears glasses    Wears hearing aid in both ears    Social History   Tobacco Use   Smoking status: Former    Current packs/day: 0.00    Average packs/day: 1 pack/day for 9.0 years (9.0 ttl pk-yrs)    Types: Cigarettes    Start date: 12/04/1954    Quit date:  12/04/1963    Years since quitting: 59.0   Smokeless tobacco: Never   Tobacco comments:    stopped smoking cigarettes at age 73  Substance Use Topics   Alcohol use: Yes    Alcohol/week: 9.0 standard drinks of alcohol    Types: 9 Glasses of wine per week    Comment: wine rarely   ROS  Review of Systems  Cardiovascular:  Negative for chest pain, dyspnea on exertion, leg swelling (chronic) and palpitations.  Gastrointestinal:  Negative for melena.    Objective  Blood pressure 139/82, resp. rate 16, height 5\' 7"  (1.702 m), weight 168 lb 3.2 oz (76.3 kg).      12/13/2022   10:20 AM 06/17/2022   11:07 AM 12/14/2021   11:05 AM  Vitals with BMI  Height 5\' 7"  5\' 7"  5\' 7"   Weight 168 lbs 3 oz 167 lbs 13 oz 169 lbs 6 oz  BMI 26.34 26.28 26.53  Systolic 139 114 557  Diastolic 82 57 69  Pulse  52 52     Physical Exam Neck:     Vascular: No carotid bruit or JVD.  Cardiovascular:     Rate and Rhythm: Regular rhythm. Bradycardia present.     Pulses: Intact distal pulses.     Heart sounds: Normal heart sounds. No murmur heard.  No gallop.  Pulmonary:     Effort: Pulmonary effort is normal.     Breath sounds: Normal breath sounds.  Abdominal:     General: Bowel sounds are normal.     Palpations: Abdomen is soft.  Musculoskeletal:     Right lower leg: No edema.     Left lower leg: No edema.    Laboratory examination:  External labs:   Cholesterol, total 120.000 m 02/16/2022 HDL 40.000 mg 02/16/2022 LDL 69.000 mg 02/16/2022 Triglycerides 56.000 mg 02/16/2022  ALT (SGPT) 21.000 IU/ 02/16/2022 eGFR 91.700 02/16/2022  TSH 1.340 02/16/2022  Labs 01/28/2021:  BUN 17, creatinine 0.9, EGFR 80 mL, potassium 4.8, LFTs normal.  Hb 13.2/HCT 39.7, platelets 233.  Total cholesterol 115, triglycerides 50, HDL 36, LDL 69.  TSH normal at 1.69.  Hemosure negative.  Medications and allergies  No Known Allergies    Current Outpatient Medications:    atorvastatin (LIPITOR) 40 MG  tablet, TAKE 1 TABLET BY MOUTH EVERY DAY IN THE MORNING, Disp: 90 tablet, Rfl: 1   ELIQUIS 5 MG TABS tablet, TAKE 1 TABLET BY MOUTH TWICE A DAY, Disp: 180 tablet, Rfl: 2   metoprolol succinate (TOPROL-XL) 25 MG 24 hr tablet, TAKE 1/2 TABLET BY MOUTH DAILY, Disp: 45 tablet, Rfl: 5   Multiple Vitamin (MULTIVITAMIN) tablet, Take 1 tablet by mouth daily., Disp: , Rfl:    nitroGLYCERIN (NITROSTAT) 0.4 MG SL tablet, Place 1 tablet (0.4 mg total) under the tongue every 5 (five) minutes as needed for chest pain (No more than 3 doses)., Disp: 25 tablet, Rfl: 3   Probiotic Product (PROBIOTIC ADVANCED PO), Take 1 tablet by mouth daily., Disp: , Rfl:    Radiology:    DG Chest 2 View Result Date: 05/27/2019 CLINICAL DATA:  Central chest pain upon wakening. EXAM: CHEST - 2 VIEW COMPARISON:  03/30/2018 FINDINGS: Heart size is normal. Mild tortuosity of the aorta. The pulmonary vascularity is normal. The lungs are clear. No effusions. No acute bone finding. Pectus deformity of the chest. IMPRESSION: No active cardiopulmonary disease. Electronically Signed   By: Paulina Fusi M.D.   On: 05/27/2019 05:17   Cardiac Studies:   Coronary angiogram 03/04/2015: Mid dominant CX 99% to 0% with 4x12mm Resolute DES. Mid LAD 80% to 0% with 2.75x10mm Resolute DES.  Exercise myoview stress 12/27/2016: 1. The patient performed treadmill exercise using a Bruce protocol, completing 5:00 minutes, achieving 6.89 METS, normal hemodynamic response. The patient did not develop symptoms other than fatigue during the procedure. 2. The stress electrocardiogram showed sinus tachycardia, normal stress conduction, no stress arrhythmias and normal stress repolarization. No ischemic changes. 3. The overall quality of the study is excellent. There is no evidence of abnormal lung activity. Stress and rest SPECT images demonstrate homogeneous tracer distribution throughout the myocardium. Gated SPECT imaging reveals normal myocardial thickening and  wall motion. The left ventricular ejection fraction was normal (67%). This is a low risk study.  Echocardiogram 01/20/2017: Left ventricle cavity is normal in size. Mild concentric hypertrophy of the left ventricle. Normal global wall motion. Calculated EF 55%. Mild (Grade I) mitral regurgitation. Mild tricuspid regurgitation. Mild pulmonic regurgitation. No evidence of pulmonary   EKG:  EKG 12/13/2022: Sinus bradycardia at rate of 52 bpm, low voltage complexes.  Pulmonary disease pattern.  Compared to 06/17/2022, no change.  Assessment     ICD-10-CM   1. Coronary artery disease involving native coronary artery of native heart without angina pectoris  I25.10     2. Paroxysmal  atrial fibrillation (HCC)  I48.0 EKG 12-Lead      CHA2DS2-VASc Score is 3.  Yearly risk of stroke: 3.2% (A, CAD).  Score of 1=0.6; 2=2.2; 3=3.2; 4=4.8; 5=7.2; 6=9.8; 7=>9.8) -(CHF; HTN; vasc disease DM,  Male = 1; Age <65 =0; 65-74 = 1,  >75 =2; stroke/embolism= 2).    No orders of the defined types were placed in this encounter.    There are no discontinued medications.   Recommendations:   CLAYBURN GARBETT  is a  89 y.o.  Caucasian gentleman with h/o CAD, on  03/03/2015 underwent coronary angiography and stenting to subtotally occluded dominant circumflex coronary artery and mid LAD by implantation of 4.0 x 30 mm and 2.75 x 22 mm resolute DES respectively on 03/04/2015.  Past medical history significant for paroxysmal atrial fibrillation, diagnosed by an EKG in the PCP office but never documented elsewhere. He is on anticoagulation by choice, hypercholesterolemia and chronic mild leg edema and uses support stockings as needed.  1. Coronary artery disease involving native coronary artery of native heart without angina pectoris Patient remains stable without angina pectoris.  He will be turning 89 years of age this month.  He continues to remain active.  No changes in the EKG.  2. Paroxysmal atrial fibrillation  [facility]) Patient is maintaining sinus rhythm.  He is on anticoagulation as pressure sores.  His blood pressure was slightly elevated today however over the last many years his systolic blood pressure has now been greater than 120-130 mmHg.  Advised him to monitor this and to let us know if his systolic blood pressure is >130 mmHg.  I did not make any changes to his medications.  I will see him back in 6 months for follow-up.  He is on a low-dose of a beta-blocker for PAF.  - EKG 12-Lead      Yates Decamp, MD, [facility] 12/13/2022, 10:53 AM Office: [phone]

## 2022-12-16 ENCOUNTER — Ambulatory Visit: Payer: Medicare Other | Admitting: Cardiology

## 2023-02-18 ENCOUNTER — Telehealth: Payer: Self-pay | Admitting: Cardiology

## 2023-02-18 DIAGNOSIS — I480 Paroxysmal atrial fibrillation: Secondary | ICD-10-CM

## 2023-02-18 MED ORDER — APIXABAN 5 MG PO TABS: 5.0000 mg | ORAL_TABLET | Freq: Two times a day (BID) | ORAL | 1 refills | Status: DC

## 2023-02-18 NOTE — Telephone Encounter (Signed)
*  STAT* If patient is at the pharmacy, call can be transferred to refill team.   1. Which medications need to be refilled? (please list name of each medication and dose if known)   ELIQUIS 5 MG TABS tablet   2. Would you like to learn more about the convenience, safety, & potential cost savings by using the [facility]?   3. Are you open to using the [facility] (Type [facility]. ).  4. Which pharmacy/location (including street and city if local pharmacy) is medication to be sent to?  [id number] - [place], [place] - [address]   5. Do they need a 30 day or 90 day supply?  90 day  Patient stated he has a small amount of this medication left.  Patient wants a call back to confirm refill sent.

## 2023-02-18 NOTE — Telephone Encounter (Signed)
Prescription refill request for Eliquis received. Indication: Afib  Last office visit: 12/13/22 Jacinto Halim)  Scr: 0.8 (02/16/22)  Age: 89 Weight: 76.3kg   Labs overdue. Pt has scheduled appt on 05/30/22 with Dr Jacinto Halim. Note placed on appt to have CBC/BMET drawn at appt. Refill sent. Called pt and made him ware refill has been sent. Called pt to make him aware, no answer. Left message on voicemail.

## 2023-05-31 ENCOUNTER — Encounter: Payer: Self-pay | Admitting: Cardiology

## 2023-05-31 ENCOUNTER — Ambulatory Visit: Payer: Medicare Other | Attending: Cardiology | Admitting: Cardiology

## 2023-05-31 VITALS — BP 124/70 | HR 54 | Resp 16 | Ht 67.0 in | Wt 168.4 lb

## 2023-05-31 DIAGNOSIS — I2510 Atherosclerotic heart disease of native coronary artery without angina pectoris: Secondary | ICD-10-CM | POA: Diagnosis not present

## 2023-05-31 DIAGNOSIS — R001 Bradycardia, unspecified: Secondary | ICD-10-CM

## 2023-05-31 DIAGNOSIS — I480 Paroxysmal atrial fibrillation: Secondary | ICD-10-CM | POA: Diagnosis not present

## 2023-05-31 NOTE — Patient Instructions (Signed)
 Medication Instructions:  Your physician recommends that you continue on your current medications as directed. Please refer to the Current Medication list given to you today.  *If you need a refill on your cardiac medications before your next appointment, please call your pharmacy*   Lab Work: Have lab work done at [facility] on the first floor today--CBC and BMP If you have labs (blood work) drawn today and your tests are completely normal, you will receive your results only by: MyChart Message (if you have MyChart) OR A paper copy in the mail If you have any lab test that is abnormal or we need to change your treatment, we will call you to review the results.   Testing/Procedures: none   Follow-Up: At [facility], you and your health needs are our priority.  As part of our continuing mission to provide you with exceptional heart care, we have created designated Provider Care Teams.  These Care Teams include your primary Cardiologist (physician) and Advanced Practice Providers (APPs -  Physician Assistants and Nurse Practitioners) who all work together to provide you with the care you need, when you need it.  We recommend signing up for the patient portal called "MyChart".  Sign up information is provided on this After Visit Summary.  MyChart is used to connect with patients for Virtual Visits (Telemedicine).  Patients are able to view lab/test results, encounter notes, upcoming appointments, etc.  Non-urgent messages can be sent to your provider as well.   To learn more about what you can do with MyChart, go to [email or web address].    Your next appointment:   6 month(s)  Provider:   Yates Decamp, MD     Other Instructions

## 2023-05-31 NOTE — Progress Notes (Signed)
 Cardiology Office Note:  .   Date:  05/31/2023  ID:  Calan, Doren 11/09/1933, MRN [id number] PCP: Chilton Greathouse, MD  [facility] Providers Cardiologist:  Yates Decamp, MD   History of Present Illness: Stuart Flores is a 89 y.o.  Caucasian gentleman with h/o CAD, on  03/03/2015 underwent coronary angiography and stenting to subtotally occluded dominant circumflex coronary artery and mid LAD by implantation of 4.0 x 30 mm and 2.75 x 22 mm resolute DES respectively on 03/04/2015.  Past medical history significant for paroxysmal atrial fibrillation, diagnosed by an EKG in the PCP office but never documented elsewhere. He is on anticoagulation by choice, hypercholesterolemia and chronic mild leg edema and uses support stockings as needed.   This is a 52-month office visit.  He remains asymptomatic.  Discussed the use of AI scribe software for clinical note transcription with the patient, who gave verbal consent to proceed.  History of Present Illness   The patient, with a history of cardiovascular disease managed with Eliquis and metoprolol succinate 25mg  half a tablet daily, presents for a routine follow-up. He reports no recent changes in his health status and denies any new symptoms, including shortness of breath. He maintains an active lifestyle, walking thirty minutes daily, including uphill. He has not had recent lab work, and the doctor plans to order tests to monitor his kidney function and blood count due to his medication regimen.       Labs   External Labs:  NA  Review of Systems  Cardiovascular:  Negative for chest pain, dyspnea on exertion and leg swelling.   Physical Exam:   VS:  BP 124/70 (BP Location: Left Arm, Patient Position: Sitting, Cuff Size: Normal)   Pulse (!) 54   Resp 16   Ht 5\' 7"  (1.702 m)   Wt 168 lb 6.4 oz (76.4 kg)   SpO2 95%   BMI 26.38 kg/m    Wt Readings from Last 3 Encounters:  05/31/23 168 lb 6.4 oz (76.4 kg)  12/13/22 168  lb 3.2 oz (76.3 kg)  06/17/22 167 lb 12.8 oz (76.1 kg)    Physical Exam Neck:     Vascular: No carotid bruit or JVD.  Cardiovascular:     Rate and Rhythm: Normal rate and regular rhythm.     Pulses: Intact distal pulses.     Heart sounds: Normal heart sounds. No murmur heard.    No gallop.  Pulmonary:     Effort: Pulmonary effort is normal.     Breath sounds: Normal breath sounds.  Abdominal:     General: Bowel sounds are normal.     Palpations: Abdomen is soft.  Musculoskeletal:     Right lower leg: No edema.     Left lower leg: No edema.    Studies Reviewed: Marland Kitchen    Coronary angiogram 03/04/2015:  Mid circumflex coronary artery with implantation of a 4.0 x 30 mm resolute integrity DES  Mid LAD with implantation of a 2.75 x 22 mm resolute DES     EKG:    EKG 12/13/2022: Sinus bradycardia at rate of 52 bpm, low voltage complexes. Pulmonary disease pattern.   Medications and allergies    No Known Allergies   Current Outpatient Medications:    apixaban (ELIQUIS) 5 MG TABS tablet, Take 1 tablet (5 mg total) by mouth 2 (two) times daily., Disp: 180 tablet, Rfl: 1   atorvastatin (LIPITOR) 40 MG tablet, TAKE 1 TABLET BY MOUTH  EVERY DAY IN THE MORNING, Disp: 90 tablet, Rfl: 1   metoprolol succinate (TOPROL-XL) 25 MG 24 hr tablet, TAKE 1/2 TABLET BY MOUTH DAILY, Disp: 45 tablet, Rfl: 5   Multiple Vitamin (MULTIVITAMIN) tablet, Take 1 tablet by mouth daily., Disp: , Rfl:    nitroGLYCERIN (NITROSTAT) 0.4 MG SL tablet, Place 1 tablet (0.4 mg total) under the tongue every 5 (five) minutes as needed for chest pain (No more than 3 doses)., Disp: 25 tablet, Rfl: 3   Probiotic Product (PROBIOTIC ADVANCED PO), Take 1 tablet by mouth daily., Disp: , Rfl:    ASSESSMENT AND PLAN: .      ICD-10-CM   1. Paroxysmal atrial fibrillation (HCC)  I48.0 CBC    Basic Metabolic Panel (BMET)    2. Coronary artery disease involving native coronary artery of native heart without angina pectoris   I25.10 CBC    Basic Metabolic Panel (BMET)    3. Bradycardia by electrocardiogram  R00.1 CBC    Basic Metabolic Panel (BMET)      Assessment and Plan    Atrial Fibrillation Currently on Eliquis with no recent labs to monitor kidney function and blood count, which are necessary due to anticoagulant therapy. Reports no dyspnea and maintains a daily walking routine, indicating a well-managed condition. Emphasized the importance of regular blood work to monitor kidney function and blood count due to risks associated with Eliquis, including bleeding and renal impairment. Order CBC and BMP. Follow-up in six months.  He has underlying sinus bradycardia but remains completely asymptomatic.  Chronic leg edema has remained stable.  Hypertension On metoprolol succinate 25 mg, half a tablet daily. Reports no new symptoms and continues to walk 30 minutes daily, including up and down hills, indicating good control of hypertension. Continue metoprolol succinate 25 mg, half a tablet daily. Follow-up in six months.  CAD of native vessel without angina pectoris Patient has history of PCI in 2016 to CX and LAD but remains asymptomatic.  Presently on Eliquis alone along with atorvastatin 40 mg daily and metoprolol succinate 25 mg daily.  Continue the same.  General Health Maintenance Maintains a healthy lifestyle by walking 30 minutes daily. Discussed parking and access options for future visits. Encourage continued physical activity. Discuss parking and access options for future visits.  Follow-up Follow-up in six months. Place lab orders for CBC and BMP.   Signed,  Yates Decamp, MD, [facility] 05/31/2023, 8:23 PM [facility] [address] [place], [place] [address] Phone: [phone]. Fax:  [phone]

## 2023-06-01 LAB — CBC
Hematocrit: 37.2 % — ABNORMAL LOW (ref 37.5–51.0)
Hemoglobin: 12.2 g/dL — ABNORMAL LOW (ref 13.0–17.7)
MCH: 30.8 pg (ref 26.6–33.0)
MCHC: 32.8 g/dL (ref 31.5–35.7)
MCV: 94 fL (ref 79–97)
Platelets: 253 10*3/uL (ref 150–450)
RBC: 3.96 x10E6/uL — ABNORMAL LOW (ref 4.14–5.80)
RDW: 12.8 % (ref 11.6–15.4)
WBC: 8.3 10*3/uL (ref 3.4–10.8)

## 2023-06-01 LAB — BASIC METABOLIC PANEL
BUN/Creatinine Ratio: 20 (ref 10–24)
BUN: 19 mg/dL (ref 8–27)
CO2: 24 mmol/L (ref 20–29)
Calcium: 9.2 mg/dL (ref 8.6–10.2)
Chloride: 102 mmol/L (ref 96–106)
Creatinine, Ser: 0.93 mg/dL (ref 0.76–1.27)
Glucose: 70 mg/dL (ref 70–99)
Potassium: 4.4 mmol/L (ref 3.5–5.2)
Sodium: 139 mmol/L (ref 134–144)
eGFR: 79 mL/min/{1.73_m2} (ref >59)

## 2023-06-05 ENCOUNTER — Encounter: Payer: Self-pay | Admitting: Cardiology

## 2023-06-05 NOTE — Progress Notes (Signed)
 Very stable blood counts over the past 4 years.  Minimal anemia.  Normal renal function.  Potassium levels are normal.  Continue present medical management.

## 2023-06-09 ENCOUNTER — Ambulatory Visit: Payer: Self-pay | Admitting: Cardiology

## 2023-11-29 ENCOUNTER — Ambulatory Visit: Attending: Cardiology | Admitting: Cardiology

## 2023-11-29 ENCOUNTER — Encounter (HOSPITAL_COMMUNITY): Payer: Self-pay | Admitting: *Deleted

## 2023-11-29 ENCOUNTER — Telehealth (HOSPITAL_COMMUNITY): Payer: Self-pay | Admitting: *Deleted

## 2023-11-29 ENCOUNTER — Encounter: Payer: Self-pay | Admitting: Cardiology

## 2023-11-29 ENCOUNTER — Encounter: Payer: Self-pay | Admitting: *Deleted

## 2023-11-29 VITALS — BP 98/50 | HR 56 | Resp 16 | Ht 67.0 in | Wt 167.2 lb

## 2023-11-29 DIAGNOSIS — R001 Bradycardia, unspecified: Secondary | ICD-10-CM

## 2023-11-29 DIAGNOSIS — I2510 Atherosclerotic heart disease of native coronary artery without angina pectoris: Secondary | ICD-10-CM | POA: Diagnosis not present

## 2023-11-29 DIAGNOSIS — R9431 Abnormal electrocardiogram [ECG] [EKG]: Secondary | ICD-10-CM | POA: Diagnosis not present

## 2023-11-29 DIAGNOSIS — I480 Paroxysmal atrial fibrillation: Secondary | ICD-10-CM

## 2023-11-29 NOTE — Telephone Encounter (Signed)
 Letter with instructions for upcoming stress test sent via [employer].  Argentina Bees, RN

## 2023-11-29 NOTE — Progress Notes (Signed)
 Cardiology Office Note:  .   Date:  11/29/2023  ID:  Stuart, Flores 1933-05-17, MRN [id number] PCP: Janey Santos, MD  [facility] Providers Cardiologist:  Gordy Bergamo, MD   History of Present Illness: Stuart Flores is a 89 y.o. Caucasian gentleman with h/o CAD, on  03/03/2015 underwent coronary angiography and stenting to subtotally occluded dominant circumflex coronary artery and mid LAD by implantation of 4.0 x 30 mm and 2.75 x 22 mm resolute DES respectively on 03/04/2015.  Past medical history significant for paroxysmal atrial fibrillation, diagnosed by an EKG in the PCP office but never documented elsewhere. He is on anticoagulation by choice, hypercholesterolemia and chronic mild leg edema and uses support stockings as needed.   This is a 60-month office visit.  He remains asymptomatic.  Cardiac Studies relevent.    Coronary angiogram 03/04/2015:   Mid circumflex coronary artery with implantation of a 4.0 x 30 mm resolute integrity DES  Mid LAD with implantation of a 2.75 x 22 mm resolute DES          Discussed the use of AI scribe software for clinical note transcription with the patient, who gave verbal consent to proceed.  History of Present Illness Stuart Flores is an 89 year old male with coronary artery disease who presents for a routine six-month follow-up. He is accompanied by his spouse.  He is asymptomatic with no angina or bleeding issues while on blood thinners. Bowel movements are regular with no melena. He maintains a daily walking routine of one and a half miles in thirty-five minutes and participates in flexibility and flow classes.  Coronary artery disease with stents placed in 2016 in the LAD and circumflex coronary artery. He takes metoprolol  succinate 12.5 mg once daily and Eliquis . Atrial fibrillation is present but with no recent episodes documented.  He feels cold more often than his spouse, who is comfortable at the same  temperature.   Labs   Recent Labs    05/31/23 1147  NA 139  K 4.4  CL 102  CO2 24  GLUCOSE 70  BUN 19  CREATININE 0.93  CALCIUM  9.2       Latest Ref Rng & Units 05/31/2023   11:47 AM 05/27/2019    4:53 AM 04/09/2019    9:18 PM  CBC  WBC 3.4 - 10.8 x10E3/uL 8.3  7.4  5.3   Hemoglobin 13.0 - 17.7 g/dL 87.7  87.9  88.1   Hematocrit 37.5 - 51.0 % 37.2  37.9  35.2   Platelets 150 - 450 x10E3/uL 253  222  202    No results found for: HGBA1C  Lab Results  Component Value Date   TSH 1.017 04/02/2018    Care everywhere/Faxed External Labs:  Labs 03/01/2023:  Total cholesterol 126, triglycerides 71, HDL 34, LDL 78.  ROS  Review of Systems  Cardiovascular:  Negative for chest pain, dyspnea on exertion and leg swelling.   Physical Exam:   VS:  BP (!) 98/50 (BP Location: Left Arm, Patient Position: Sitting, Cuff Size: Normal)   Pulse (!) 56   Resp 16   Ht 5' 7 (1.702 m)   Wt 167 lb 3.2 oz (75.8 kg)   SpO2 96%   BMI 26.19 kg/m    Wt Readings from Last 3 Encounters:  11/29/23 167 lb 3.2 oz (75.8 kg)  05/31/23 168 lb 6.4 oz (76.4 kg)  12/13/22 168 lb 3.2 oz (76.3 kg)  BP Readings from Last 3 Encounters:  11/29/23 (!) 98/50  05/31/23 124/70  12/13/22 139/82   Physical Exam Neck:     Vascular: No carotid bruit or JVD.  Cardiovascular:     Rate and Rhythm: Normal rate and regular rhythm.     Pulses: Intact distal pulses.     Heart sounds: Normal heart sounds. No murmur heard.    No gallop.  Pulmonary:     Effort: Pulmonary effort is normal.     Breath sounds: Normal breath sounds.  Abdominal:     General: Bowel sounds are normal.     Palpations: Abdomen is soft.  Musculoskeletal:     Right lower leg: No edema.     Left lower leg: No edema.    EKG:    EKG Interpretation Date/Time:  Tuesday November 29 2023 09:05:30 EDT Ventricular Rate:  56 PR Interval:  196 QRS Duration:  78 QT Interval:  440 QTC Calculation: 424 R Axis:   37  Text  Interpretation: EKG 11/29/2023: Sinus bradycardia at rate of 56 bpm, low-voltage complexes.  Nonspecific inferior T wave abnormality.  Compared to 05/27/2019, T wave abnormality in 3 and aVF is new. Confirmed by Aayra Hornbaker, Jagadeesh ([id number]) on 11/29/2023 9:21:43 AM    ASSESSMENT AND PLAN: .      ICD-10-CM   1. Coronary artery disease involving native coronary artery of native heart without angina pectoris  I25.10 EKG 12-Lead    Cardiac Stress Test: Informed Consent Details: Physician/Practitioner Attestation; Transcribe to consent form and obtain patient signature    MYOCARDIAL PERFUSION IMAGING    2. Nonspecific abnormal electrocardiogram (ECG) (EKG)  R94.31 Cardiac Stress Test: Informed Consent Details: Physician/Practitioner Attestation; Transcribe to consent form and obtain patient signature    MYOCARDIAL PERFUSION IMAGING    3. Bradycardia by electrocardiogram  R00.1 MYOCARDIAL PERFUSION IMAGING    4. Paroxysmal atrial fibrillation (HCC)  I48.0 MYOCARDIAL PERFUSION IMAGING     Assessment & Plan Coronary artery disease status post stent placement Coronary artery disease with stent placement in 2016 in the LAD and circumflex coronary artery. No current angina symptoms. New EKG changes with T wave inversion in inferior leads warrant further investigation, especially considering upcoming travel plans. - Order exercise nuclear stress test  Bradycardia Bradycardia with heart rate and blood pressure at 98/50 mmHg. Currently on metoprolol  succinate 12.5 mg once daily. Discontinue metoprolol  succinate to prevent further bradycardia and hypotension. Monitor for symptoms of tachycardia or other cardiac symptoms after discontinuation. - Discontinue metoprolol  succinate   History of atrial fibrillation Atrial fibrillation with no recent episodes. On Eliquis  for stroke prevention, continued based on preference to avoid stroke risk. No bleeding issues reported. - Patient has no documented A-fib however  prefers to be on anticoagulation due to stroke risk, patient preference. - Continue Eliquis  as prescribed reviewed his labs including BMP, renal function is normal, on appropriate dose.   Follow up: 6 months unless stress test is abnormal.  Signed,  Gordy Bergamo, MD, [facility] 11/29/2023, 11:10 AM [facility] [address] [place], [place] [address] Phone: [phone]. Fax:  [phone]

## 2023-11-29 NOTE — Patient Instructions (Signed)
 Medication Instructions:  Your physician has recommended you make the following change in your medication: Stop metoprolol  succinate  *If you need a refill on your cardiac medications before your next appointment, please call your pharmacy*  Lab Work: none If you have labs (blood work) drawn today and your tests are completely normal, you will receive your results only by: MyChart Message (if you have MyChart) OR A paper copy in the mail If you have any lab test that is abnormal or we need to change your treatment, we will call you to review the results.  Testing/Procedures: Your physician has requested that you have an exercise stress myoview. For further information please visit [email or web address] Please follow instruction sheet, as given.   Follow-Up: At [facility], you and your health needs are our priority.  As part of our continuing mission to provide you with exceptional heart care, our providers are all part of one team.  This team includes your primary Cardiologist (physician) and Advanced Practice Providers or APPs (Physician Assistants and Nurse Practitioners) who all work together to provide you with the care you need, when you need it.  Your next appointment:   6 month(s)  Provider:   Gordy Bergamo, MD    We recommend signing up for the patient portal called MyChart.  Sign up information is provided on this After Visit Summary.  MyChart is used to connect with patients for Virtual Visits (Telemedicine).  Patients are able to view lab/test results, encounter notes, upcoming appointments, etc.  Non-urgent messages can be sent to your provider as well.   To learn more about what you can do with MyChart, go to [email or web address].   Other Instructions

## 2023-12-06 ENCOUNTER — Other Ambulatory Visit: Payer: Self-pay | Admitting: Cardiology

## 2023-12-06 DIAGNOSIS — R001 Bradycardia, unspecified: Secondary | ICD-10-CM

## 2023-12-06 DIAGNOSIS — I2510 Atherosclerotic heart disease of native coronary artery without angina pectoris: Secondary | ICD-10-CM

## 2023-12-06 DIAGNOSIS — R9431 Abnormal electrocardiogram [ECG] [EKG]: Secondary | ICD-10-CM

## 2023-12-06 DIAGNOSIS — I480 Paroxysmal atrial fibrillation: Secondary | ICD-10-CM

## 2023-12-07 ENCOUNTER — Ambulatory Visit (HOSPITAL_COMMUNITY)
Admission: RE | Admit: 2023-12-07 | Discharge: 2023-12-07 | Disposition: A | Discharge status: Home / Self Care | Source: Ambulatory Visit | Attending: Internal Medicine | Admitting: Internal Medicine

## 2023-12-07 ENCOUNTER — Other Ambulatory Visit: Payer: Self-pay | Admitting: Cardiology

## 2023-12-07 ENCOUNTER — Ambulatory Visit: Payer: Self-pay | Admitting: Cardiology

## 2023-12-07 DIAGNOSIS — R001 Bradycardia, unspecified: Secondary | ICD-10-CM

## 2023-12-07 DIAGNOSIS — I2510 Atherosclerotic heart disease of native coronary artery without angina pectoris: Secondary | ICD-10-CM | POA: Diagnosis present

## 2023-12-07 DIAGNOSIS — R9431 Abnormal electrocardiogram [ECG] [EKG]: Secondary | ICD-10-CM | POA: Diagnosis present

## 2023-12-07 DIAGNOSIS — I480 Paroxysmal atrial fibrillation: Secondary | ICD-10-CM

## 2023-12-07 LAB — MYOCARDIAL PERFUSION IMAGING
Angina Index: 0
Base ST Depression (mm): 0 mm
Duke Treadmill Score: 4
Estimated workload: 4.8
Exercise duration (min): 4 min
Exercise duration (sec): 0 s
LV dias vol: 87 mL (ref 62–150)
LV sys vol: 23 mL (ref 4.2–5.8)
MPHR: 133 {beats}/min
Nuc Stress EF: 74 %
Peak HR: 118 {beats}/min
Percent HR: 113 %
Rest HR: 62 {beats}/min
Rest Nuclear Isotope Dose: 10.8 mCi
SDS: 9
SRS: 15
SSS: 23
ST Depression (mm): 0 mm
Stress Nuclear Isotope Dose: 32.3 mCi
TID: 1.02

## 2023-12-07 MED ORDER — TECHNETIUM TC 99M TETROFOSMIN IV KIT
10.8000 | PACK | Freq: Once | INTRAVENOUS | Status: AC | PRN
  Administered 2023-12-07: 10.8 via INTRAVENOUS

## 2023-12-07 MED ORDER — TECHNETIUM TC 99M TETROFOSMIN IV KIT
32.3000 | PACK | Freq: Once | INTRAVENOUS | Status: AC | PRN
  Administered 2023-12-07: 32.3 via INTRAVENOUS

## 2023-12-07 NOTE — Progress Notes (Signed)
 Stress test is low risk and shows old scar (Circumflex coronary artery) but with normal heart function. Hence overall excellent. No change in therapy needed

## 2023-12-15 ENCOUNTER — Ambulatory Visit: Payer: Self-pay | Admitting: Cardiology

## 2023-12-15 DIAGNOSIS — I7121 Aneurysm of the ascending aorta, without rupture: Secondary | ICD-10-CM

## 2023-12-15 NOTE — Progress Notes (Signed)
 The noncardiac portion of the CT revealing an ascending aortic aneurysm 4.6 cm.  This is at least a moderate-sized aneurysm however he is presently 89 years of age with well-controlled risk factors, I will order an echocardiogram to be able to visualize the ascending aorta.  Otherwise we will monitor this clinically and no further testing is indicated at least at this time and I will discuss with him personally when he comes back in 6 months.  Nothing to be worried about.  Please place an order for echocardiogram: Indication ascending aortic aneurysm, CAD.  Stress report previously reported and no evidence of ischemia.  Showed old small heart attack however normal heart function.  Low risk study.

## 2023-12-27 ENCOUNTER — Other Ambulatory Visit: Payer: Self-pay | Admitting: Cardiology

## 2023-12-27 DIAGNOSIS — I480 Paroxysmal atrial fibrillation: Secondary | ICD-10-CM

## 2023-12-27 NOTE — Telephone Encounter (Signed)
 Prescription refill request for Eliquis  received. Indication:afib Last office visit:8/25 Scr:0.93  2/25 Age: 89 Weight:75.8  kg  Prescription refilled

## 2023-12-28 ENCOUNTER — Other Ambulatory Visit: Payer: Self-pay | Admitting: Cardiology

## 2023-12-30 ENCOUNTER — Ambulatory Visit (HOSPITAL_COMMUNITY)
Admission: RE | Admit: 2023-12-30 | Discharge: 2023-12-30 | Disposition: A | Discharge status: Home / Self Care | Source: Ambulatory Visit | Attending: Cardiology | Admitting: Cardiology

## 2023-12-30 DIAGNOSIS — I7121 Aneurysm of the ascending aorta, without rupture: Secondary | ICD-10-CM | POA: Insufficient documentation

## 2023-12-30 LAB — ECHOCARDIOGRAM COMPLETE: S' Lateral: 2.87 cm

## 2024-01-01 NOTE — Progress Notes (Signed)
 Your ascending aortic dilatation of 4.5 cm is well visualized and is comparable to the CT . Will continue to monitor this and no surgical indication

## 2024-04-17 ENCOUNTER — Ambulatory Visit

## 2024-04-17 DIAGNOSIS — M79671 Pain in right foot: Secondary | ICD-10-CM

## 2024-04-17 DIAGNOSIS — M7752 Other enthesopathy of left foot: Secondary | ICD-10-CM

## 2024-04-17 DIAGNOSIS — M79672 Pain in left foot: Secondary | ICD-10-CM | POA: Diagnosis not present

## 2024-04-17 DIAGNOSIS — L909 Atrophic disorder of skin, unspecified: Secondary | ICD-10-CM

## 2024-04-17 DIAGNOSIS — M779 Enthesopathy, unspecified: Secondary | ICD-10-CM

## 2024-04-17 DIAGNOSIS — M25871 Other specified joint disorders, right ankle and foot: Secondary | ICD-10-CM | POA: Diagnosis not present

## 2024-04-17 DIAGNOSIS — L989 Disorder of the skin and subcutaneous tissue, unspecified: Secondary | ICD-10-CM

## 2024-04-17 DIAGNOSIS — M25872 Other specified joint disorders, left ankle and foot: Secondary | ICD-10-CM | POA: Diagnosis not present

## 2024-04-18 NOTE — Progress Notes (Signed)
 "  Subjective:  Patient ID: Stuart Flores, male    DOB: 09/29/1933,  MRN: [id number]  Chief Complaint  Patient presents with   Foot Pain    rm1    Discussed the use of AI scribe software for clinical note transcription with the patient, who gave verbal consent to proceed.  History of Present Illness The patient with bilateral forefoot calluses and plantar fat pad atrophy presents with chronic discomfort at the balls of both feet for evaluation.  On the left, he has chronic, gradually progressive pain under the ball of the foot, maximal over the tibial sesamoid. Pain feels like pressure with walking and is associated with a persistent callus. Walking worsens symptoms.  On the right, he has similar but milder pressure-type pain and callus over the sesamoid region, which increases with prolonged walking.  He walks frequently during daily activities and typically uses the same shoes, though he does have other footwear available.     Review of Systems: Negative except as noted in the HPI. Denies N/V/F/Ch.  Past Medical History:  Diagnosis Date   Arthritis    ostearthritis- back, knee   BPH with obstruction/lower urinary tract symptoms    urologist-  dr [email or web address]. evans @[facility]    Bradycardia    Coronary artery disease    cardiologist-- dr ladona   History of glaucoma    per pt no issues since cataract extraction's bilaterally 2015   History of squamous cell carcinoma excision    face x2   Hypertension    PAF (paroxysmal atrial fibrillation) [facility])    cardiologist-- dr ladona   S/P drug eluting coronary stent placement 03/04/2015   DES x1 to mid CFx and DES x1 to mid LAD   Wears glasses    Wears hearing aid in both ears    Current Medications[1]  Tobacco Use History[2]  Allergies[3] Objective:   Constitutional Well developed. Well nourished. Oriented to person, place, and time.  Vascular Dorsalis pedis pulses faintly palpable bilaterally. Posterior tibial pulses faintly palpable  bilaterally. Capillary refill normal to all digits.  No cyanosis or clubbing noted. Pedal hair growth normal.  Neurologic Normal speech. Epicritic sensation to light touch grossly intact bilaterally. Negative tinel sign at tarsal tunnel bilaterally.   Dermatologic Skin texture and turgor are within normal limits.  No open wounds. Plantar to 1st MTP bilaterally is a hyperkeratotic lesion with central core. Pain to palpation. No surrounding erythema, edema, or signs of infection. Lesion present bilaterally, left more painful than right.  Musculoskeletal: 5/5 muscle strength. Mild pes cavus foot shape. Fat pad atrophy. Moderate pain to palpation of sesamoids, L>R   Radiographs: Taken and reviewed. 3 views of the left foot. Mild pes cavus foot structure. Bipartite medial sesamoid without fresh fracture appearance. Normal parabola. No other acute osseous changes such as fracture or dislocation.        Assessment:   1. Right foot pain   2. Capsulitis   3. Left foot pain   4. Benign skin lesion   5. Sesamoiditis of left foot   6. Sesamoiditis of right foot   7. Fat pad atrophy of foot      Plan:  Patient was evaluated and treated and all questions answered.  Assessment and Plan Assessment & Plan Bilateral (L>R)  foot sesamoiditis with plantar callus formation Chronic left forefoot pain due to plantar fat pad atrophy and tibial sesamoid prominence, causing increased plantar pressure and callus formation. Offloading needed to redistribute pressure. - Trimmed and debrided  plantar callus bilatreally that was consistent with porokeratosis. Patient expressed satisfaction following debridement. No bleeding or other incident.  - Applied offloading pad to redistribute pressure. - Provided additional offloading pads for other shoes. - Instructed follow-up in 3-4 weeks to assess offloading efficacy. - Discussed potential for custom orthotics if pads effective.   RTC PRN  Prentice Ovens, DPM  AACFAS Fellowship Trained Podiatric Surgeon [facility]     [1]  Current Outpatient Medications:    atorvastatin  (LIPITOR) 40 MG tablet, TAKE 1 TABLET BY MOUTH EVERY DAY IN THE MORNING, Disp: 90 tablet, Rfl: 1   ELIQUIS  5 MG TABS tablet, TAKE 1 TABLET BY MOUTH TWICE A DAY, Disp: 180 tablet, Rfl: 1   Multiple Vitamin (MULTIVITAMIN) tablet, Take 1 tablet by mouth daily., Disp: , Rfl:    nitroGLYCERIN  (NITROSTAT ) 0.4 MG SL tablet, Place 1 tablet (0.4 mg total) under the tongue every 5 (five) minutes as needed for chest pain (No more than 3 doses)., Disp: 25 tablet, Rfl: 3   Probiotic Product (PROBIOTIC ADVANCED PO), Take 1 tablet by mouth daily., Disp: , Rfl:  [2]  Social History Tobacco Use  Smoking Status Former   Current packs/day: 0.00   Average packs/day: 1 pack/day for 9.0 years (9.0 ttl pk-yrs)   Types: Cigarettes   Start date: 12/04/1954   Quit date: 12/04/1963   Years since quitting: 60.4  Smokeless Tobacco Never  Tobacco Comments   stopped smoking cigarettes at age 32  [3] No Known Allergies  "

## 2024-05-08 ENCOUNTER — Ambulatory Visit

## 2024-05-08 DIAGNOSIS — L989 Disorder of the skin and subcutaneous tissue, unspecified: Secondary | ICD-10-CM

## 2024-05-08 DIAGNOSIS — M779 Enthesopathy, unspecified: Secondary | ICD-10-CM

## 2024-05-08 DIAGNOSIS — L909 Atrophic disorder of skin, unspecified: Secondary | ICD-10-CM

## 2024-05-08 DIAGNOSIS — M25871 Other specified joint disorders, right ankle and foot: Secondary | ICD-10-CM | POA: Diagnosis not present

## 2024-05-08 DIAGNOSIS — M25872 Other specified joint disorders, left ankle and foot: Secondary | ICD-10-CM | POA: Diagnosis not present

## 2024-05-08 DIAGNOSIS — M79671 Pain in right foot: Secondary | ICD-10-CM | POA: Diagnosis not present

## 2024-05-08 DIAGNOSIS — M79672 Pain in left foot: Secondary | ICD-10-CM | POA: Diagnosis not present
